# Patient Record
Sex: Female | Born: 1940 | Hispanic: No | Marital: Married | Smoking: Never smoker
Health system: Southern US, Community
[De-identification: ages and names within clinical notes are randomized; demographics above are authoritative.]

## PROBLEM LIST (undated history)

## (undated) DIAGNOSIS — E785 Hyperlipidemia, unspecified: Secondary | ICD-10-CM

## (undated) DIAGNOSIS — N289 Disorder of kidney and ureter, unspecified: Secondary | ICD-10-CM

## (undated) DIAGNOSIS — J309 Allergic rhinitis, unspecified: Secondary | ICD-10-CM

## (undated) DIAGNOSIS — I10 Essential (primary) hypertension: Secondary | ICD-10-CM

## (undated) DIAGNOSIS — Z201 Contact with and (suspected) exposure to tuberculosis: Secondary | ICD-10-CM

## (undated) HISTORY — DX: Essential (primary) hypertension: I10

## (undated) HISTORY — DX: Hyperlipidemia, unspecified: E78.5

## (undated) HISTORY — PX: ABDOMINAL SURGERY: SHX537

## (undated) HISTORY — DX: Disorder of kidney and ureter, unspecified: N28.9

## (undated) HISTORY — DX: Contact with and (suspected) exposure to tuberculosis: Z20.1

## (undated) HISTORY — DX: Allergic rhinitis, unspecified: J30.9

---

## 2006-04-23 ENCOUNTER — Encounter: Payer: Self-pay | Admitting: Emergency Medicine

## 2007-07-01 ENCOUNTER — Encounter: Payer: Self-pay | Admitting: Emergency Medicine

## 2007-07-21 ENCOUNTER — Encounter: Payer: Self-pay | Admitting: Emergency Medicine

## 2008-02-29 ENCOUNTER — Encounter: Payer: Self-pay | Admitting: Emergency Medicine

## 2008-03-03 ENCOUNTER — Encounter: Payer: Self-pay | Admitting: Emergency Medicine

## 2008-08-17 ENCOUNTER — Encounter: Payer: Self-pay | Admitting: Emergency Medicine

## 2008-08-18 ENCOUNTER — Encounter: Payer: Self-pay | Admitting: Emergency Medicine

## 2009-12-28 ENCOUNTER — Ambulatory Visit: Payer: Self-pay | Admitting: Emergency Medicine

## 2009-12-28 DIAGNOSIS — I10 Essential (primary) hypertension: Secondary | ICD-10-CM | POA: Insufficient documentation

## 2009-12-28 DIAGNOSIS — J309 Allergic rhinitis, unspecified: Secondary | ICD-10-CM | POA: Insufficient documentation

## 2009-12-28 DIAGNOSIS — N189 Chronic kidney disease, unspecified: Secondary | ICD-10-CM

## 2009-12-28 DIAGNOSIS — J8410 Pulmonary fibrosis, unspecified: Secondary | ICD-10-CM | POA: Insufficient documentation

## 2010-01-30 ENCOUNTER — Ambulatory Visit: Payer: Self-pay | Admitting: Emergency Medicine

## 2010-01-30 LAB — CONVERTED CEMR LAB: Cholesterol, target level: 200 mg/dL

## 2010-02-02 ENCOUNTER — Encounter: Payer: Self-pay | Admitting: Emergency Medicine

## 2010-02-07 ENCOUNTER — Encounter: Payer: Self-pay | Admitting: Emergency Medicine

## 2010-03-07 ENCOUNTER — Other Ambulatory Visit: Admission: RE | Admit: 2010-03-07 | Discharge: 2010-03-07 | Payer: Self-pay | Admitting: Obstetrics and Gynecology

## 2010-03-16 ENCOUNTER — Encounter: Admission: RE | Admit: 2010-03-16 | Discharge: 2010-03-16 | Payer: Self-pay | Admitting: Obstetrics and Gynecology

## 2010-06-26 NOTE — Assessment & Plan Note (Signed)
Summary: ILD   Visit Type:  Initial Consult Primary Provider/Referring Provider:  Dr. Posey Pronto  CC:  Patient needs to get established with pulmonologist in [place]. Moved from [place] several months afo and lives with her daughter. She says she is having no problems with her breathing today.Marland Kitchen  History of Present Illness: 70 yo woman, never smoker, native of the [place]. 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 Sent home on O2 that was reportedly weaned to off in a few months. No real problems since. 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 No other hospitalizations. She has had PFT and imaging before.   She presents today to establish care. Not having any acute problems. No reported hx RA, SLE, other inflammatory disease. No aspiration hx or current signs, no dysphagia. She had a remote TB exposure as a girl, and thinks she had BCG in the [place]. Has never been on amiodarone, MTX or other meds associated with ILD.   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 Alcohol drinks/day:  0 Smoking  Status:  never  Review of Systems       The patient complains of shortness of breath with activity, non-productive cough, acid heartburn, itching, joint stiffness or pain, and rash.  The patient denies shortness of breath at rest, productive cough, coughing up blood, chest pain, irregular heartbeats, indigestion, loss of appetite, weight change, abdominal pain, difficulty swallowing, sore throat, tooth/dental problems, headaches, nasal congestion/difficulty breathing through nose, sneezing, ear ache, anxiety, depression, hand/feet swelling, change in color of mucus, and fever.    Vital Signs:  Patient profile:   70 year old female Height:      59 inches (149.86 cm) Weight:      110.38 pounds (50.17 kg) BMI:     22.37 O2 Sat:      98 % on Room air Temp:     97.8 degrees F (36.56 degrees C) oral Pulse rate:   52 / minute BP sitting:   118 / 72  (left arm) Cuff size:   regular  Vitals Entered By: Francesca Jewett CMA (December 28, 2009 2:49 PM)  O2 Sat at Rest %:  98 O2 Flow:  Room air CC: Patient needs to get established with pulmonologist in [place]. Moved from [place] several months afo and lives with her daughter. She says she is having no problems with her breathing today. Is Patient Diabetic? No   Physical Exam  General:  Small comfortable woman, normal appearance and healthy appearing.   Head:  normocephalic and atraumatic Eyes:  conjunctiva and sclera clear Nose:  no deformity, discharge, inflammation, or  lesions Mouth:  no deformity or lesions Neck:  no masses, thyromegaly, or abnormal cervical nodes Lungs:  few fine insp crackles at B bases, no wheezing Heart:  regular rate and rhythm, S1, S2 without murmurs, rubs, gallops, or clicks Abdomen:  not examined Msk:  no deformity or scoliosis noted with normal posture Extremities:  no clubbing, cyanosis, edema, or deformity noted Neurologic:  non-focal Skin:  intact without lesions or rashes Psych:  alert and cooperative; normal  mood and affect; normal attention span and concentration   Impression & Recommendations:  Problem # 1:  INTERSTITIAL LUNG DISEASE (ICD-515) Unclear whether this was truly IPF, but given the time course and her lack of symptoms I doubt this. Her pulmonolgist in [place] raised question of COP or some other reversible process, and I think this is much more likely.  - establish current baseline with walking oximetry and PFT's - compare to priors from [facility] when records arrive - imaging when I've reviewed records - CXR vs CT scan  Other Orders: Consultation Level IV [id number])  Patient Instructions: 1)  We will obtain your x-rays, records and pulmonary tests from [place].  2)  We will repeat your Pulmonary Function Testing at your next appointment.  3)  Walking oximetry today showed that  4)  Follow up with Dr Lamonte Sakai in 1 month  Appended Document: ILD Ambulatory Pulse Oximetry  Resting; HR___55__    02 Sat__97% on room air___  Lap1 (185 feet)   HR__67___   02 Sat__97% on room air___ Lap2 (185 feet)   HR__73___   02 Sat__95% on room air___    Lap3 (185 feet)   HR__74___   02 Sat__96% on room air___  _x__Test Completed without Difficulty ___Test Stopped due to:

## 2010-06-26 NOTE — Letter (Signed)
Summary: [facility]  [facility]   Imported By: Bubba Hales 02/16/2010 10:38:44  _____________________________________________________________________  External Attachment:    Type:   Image     Comment:   External Document

## 2010-06-26 NOTE — Letter (Signed)
Summary: Medical Record Release  Medical Record Release   Imported By: Adin Hector 02/07/2010 13:36:16  _____________________________________________________________________  External Attachment:    Type:   Image     Comment:   External Document

## 2010-06-26 NOTE — Letter (Signed)
Summary: record release  record release   Imported By: Mateo Flow 02/02/2010 16:49:18  _____________________________________________________________________  External Attachment:    Type:   Image     Comment:   External Document

## 2010-06-26 NOTE — Letter (Signed)
Summary: [facility]  [facility]   Imported By: Bubba Hales 02/16/2010 10:40:41  _____________________________________________________________________  External Attachment:    Type:   Image     Comment:   External Document

## 2010-06-26 NOTE — Miscellaneous (Signed)
Summary: Orders Update pft charges  Clinical Lists Changes  Orders: Added new Service order of Carbon Monoxide diffusing w/capacity (94720) - Signed Added new Service order of Lung Volumes (94240) - Signed Added new Service order of Spirometry (Pre & Post) (94060) - Signed 

## 2010-06-26 NOTE — Letter (Signed)
Summary: Dione Housekeeper MD/[facility]  Ather Deboraha Sprang MD/[facility]   Imported By: Bubba Hales 02/16/2010 09:53:30  _____________________________________________________________________  External Attachment:    Type:   Image     Comment:   External Document

## 2010-06-26 NOTE — Assessment & Plan Note (Signed)
Summary: ILD   Visit Type:  Follow-up Primary Paula Hartman/Referring Sonji Starkes:  Dr. Posey Pronto  CC:  ILD follow-up with PFT today....  History of Present Illness: 70 yo woman, never smoker, native of the [place]. 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 Sent home on O2 that was reportedly weaned to off in a few months. No real problems since. 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 No other hospitalizations. She has had PFT and imaging before.   She presents today to establish care. Not having any acute problems. No reported hx RA, SLE, other inflammatory disease. No aspiration hx or current signs, no dysphagia. She had a remote TB exposure as a girl, and thinks she had BCG in the [place]. Has never been on amiodarone, MTX or other meds associated with ILD.   ROV 01/30/10 -- returns for f/u today for ILD. No new problems. Has had PFT today as below.   Lipid Management History:      Positive NCEP/ATP III risk factors include female age 48 years old or older and hypertension.  Negative NCEP/ATP III risk factors include non-tobacco-user status.     Current Medications (verified): 1)  Calcium 600 Mg Tabs (Calcium) .... Once Daily 2)  Metoprolol Tartrate 50 Mg Tabs (Metoprolol Tartrate) .Marland Kitchen.. 1 By Mouth Two Times A Day 3)  Nifedipine 90 Mg Xr24h-Tab (Nifedipine) .Marland Kitchen.. 1 By Mouth At Bedtime 4)  Tramadol Hcl 50 Mg Tabs (Tramadol Hcl) .... As Needed 5)  Lipitor 20 Mg Tabs (Atorvastatin Calcium) .... Once Daily 6)  Clonidine Hcl 0.1 Mg Tabs (Clonidine Hcl) .Marland Kitchen.. 1 By Mouth Two Times A Day  Allergies (verified): No Known Drug Allergies  Vital Signs:  Patient profile:   70 year old female Height:      59 inches (149.86 cm) Weight:      113 pounds (51.36 kg) BMI:     22.91 O2 Sat:      98 % on Room air Temp:     98.3 degrees F (36.83 degrees C) oral Pulse rate:   55 / minute BP  sitting:   140 / 76  (left arm) Cuff size:   regular  Vitals Entered By: Francesca Jewett CMA (January 30, 2010 12:06 PM)  O2 Sat at Rest %:  98 O2 Flow:  Room air  Physical Exam  General:  Small comfortable woman, normal appearance and healthy appearing.   Head:  normocephalic and atraumatic Eyes:  conjunctiva and sclera clear Nose:  no deformity, discharge, inflammation, or lesions Mouth:  no deformity or lesions Neck:  no masses, thyromegaly, or abnormal cervical nodes Lungs:  few fine insp crackles at B bases, no wheezing Heart:  regular rate and rhythm, S1, S2 without murmurs, rubs, gallops, or clicks Abdomen:  not examined Msk:  no deformity or scoliosis noted with normal posture Extremities:  no clubbing, cyanosis, edema, or deformity noted Neurologic:  non-focal Skin:  intact without lesions or rashes Psych:  alert and cooperative; normal mood and affect; normal attention span and concentration   Pulmonary Function Test Date: 01/30/2010 Height (in.): 59 Gender: Female  Pre-Spirometry FVC    Value: 1.77 L/min   Pred: 2.25 L/min     % Pred: 78 % FEV1    Value: 1.50 L     Pred: 1.57 L     % Pred: 96 % FEV1/FVC  Value: 85 %     Pred: 72 %    FEF 25-75  Value: 2.12 L/min   Pred: 1.99 L/min     % Pred: 106 %  Post-Spirometry FVC    Value: 1.83 L/min   Pred: 2.25 L/min     % Pred: 81 % FEV1    Value: 1.56 L     Pred: 1.57 L     % Pred: 100 % FEV1/FVC  Value: 86 %     Pred: 72 %    FEF 25-75  Value: 2.23 L/min   Pred: 1.99 L/min     % Pred: 112 %  Lung Volumes TLC    Value: 2.77 L   % Pred: 73 % RV    Value: 1.00 L   % Pred: 65 % DLCO    Value: 10.1 %   % Pred: 68 % DLCO/VA  Value: 4.27 %   % Pred: 123 %  Impression & Recommendations:  Problem # 1:  INTERSTITIAL LUNG DISEASE (ICD-515) PFT today with mild restriction. No clinical limitations currently - CXR today - compare PFT and films to those from [facility] when available - rov in 1 yr or as needed   Medications Added  to Medication List This Visit: 1)  Calcium 600 Mg Tabs (Calcium) .... Once daily 2)  Metoprolol Tartrate 50 Mg Tabs (Metoprolol tartrate) .Marland Kitchen.. 1 by mouth two times a day 3)  Nifedipine 90 Mg Xr24h-tab (Nifedipine) .Marland Kitchen.. 1 by mouth at bedtime 4)  Tramadol Hcl 50 Mg Tabs (Tramadol hcl) .... As needed 5)  Lipitor 20 Mg Tabs (Atorvastatin calcium) .... Once daily 6)  Clonidine Hcl 0.1 Mg Tabs (Clonidine hcl) .Marland Kitchen.. 1 by mouth two times a day  Other Orders: Est. Patient Level IV [id number]) T-2 View CXR ([id number])  Lipid Assessment/Plan:      Based on NCEP/ATP III, the patient's risk factor category is "2 or more risk factors and a calculated 10 year CAD risk of > 20%".  The patient's lipid goals are as follows: Total cholesterol goal is 200; LDL cholesterol goal is 130; HDL cholesterol goal is 40; Triglyceride goal is 150.    Patient Instructions: 1)  CXR today 2)  Follow up with Dr Lamonte Sakai in 1 year or sooner if you have any new problems.

## 2011-06-17 DIAGNOSIS — J069 Acute upper respiratory infection, unspecified: Secondary | ICD-10-CM | POA: Diagnosis not present

## 2011-06-18 DIAGNOSIS — N182 Chronic kidney disease, stage 2 (mild): Secondary | ICD-10-CM | POA: Diagnosis not present

## 2011-06-18 DIAGNOSIS — M810 Age-related osteoporosis without current pathological fracture: Secondary | ICD-10-CM | POA: Diagnosis not present

## 2011-06-18 DIAGNOSIS — Z79899 Other long term (current) drug therapy: Secondary | ICD-10-CM | POA: Diagnosis not present

## 2011-06-18 DIAGNOSIS — I129 Hypertensive chronic kidney disease with stage 1 through stage 4 chronic kidney disease, or unspecified chronic kidney disease: Secondary | ICD-10-CM | POA: Diagnosis not present

## 2011-06-18 DIAGNOSIS — J00 Acute nasopharyngitis [common cold]: Secondary | ICD-10-CM | POA: Diagnosis not present

## 2011-09-13 DIAGNOSIS — I129 Hypertensive chronic kidney disease with stage 1 through stage 4 chronic kidney disease, or unspecified chronic kidney disease: Secondary | ICD-10-CM | POA: Diagnosis not present

## 2011-09-13 DIAGNOSIS — Z79899 Other long term (current) drug therapy: Secondary | ICD-10-CM | POA: Diagnosis not present

## 2011-09-13 DIAGNOSIS — D510 Vitamin B12 deficiency anemia due to intrinsic factor deficiency: Secondary | ICD-10-CM | POA: Diagnosis not present

## 2011-09-13 DIAGNOSIS — M810 Age-related osteoporosis without current pathological fracture: Secondary | ICD-10-CM | POA: Diagnosis not present

## 2011-09-13 DIAGNOSIS — R030 Elevated blood-pressure reading, without diagnosis of hypertension: Secondary | ICD-10-CM | POA: Diagnosis not present

## 2011-09-18 DIAGNOSIS — R030 Elevated blood-pressure reading, without diagnosis of hypertension: Secondary | ICD-10-CM | POA: Diagnosis not present

## 2011-09-18 DIAGNOSIS — I129 Hypertensive chronic kidney disease with stage 1 through stage 4 chronic kidney disease, or unspecified chronic kidney disease: Secondary | ICD-10-CM | POA: Diagnosis not present

## 2011-09-18 DIAGNOSIS — Z79899 Other long term (current) drug therapy: Secondary | ICD-10-CM | POA: Diagnosis not present

## 2011-09-18 DIAGNOSIS — N182 Chronic kidney disease, stage 2 (mild): Secondary | ICD-10-CM | POA: Diagnosis not present

## 2011-09-18 DIAGNOSIS — J00 Acute nasopharyngitis [common cold]: Secondary | ICD-10-CM | POA: Diagnosis not present

## 2011-09-18 DIAGNOSIS — D510 Vitamin B12 deficiency anemia due to intrinsic factor deficiency: Secondary | ICD-10-CM | POA: Diagnosis not present

## 2011-09-18 DIAGNOSIS — M810 Age-related osteoporosis without current pathological fracture: Secondary | ICD-10-CM | POA: Diagnosis not present

## 2011-10-01 DIAGNOSIS — D500 Iron deficiency anemia secondary to blood loss (chronic): Secondary | ICD-10-CM | POA: Diagnosis not present

## 2011-10-01 DIAGNOSIS — M810 Age-related osteoporosis without current pathological fracture: Secondary | ICD-10-CM | POA: Diagnosis not present

## 2011-10-01 DIAGNOSIS — I129 Hypertensive chronic kidney disease with stage 1 through stage 4 chronic kidney disease, or unspecified chronic kidney disease: Secondary | ICD-10-CM | POA: Diagnosis not present

## 2011-12-24 DIAGNOSIS — E785 Hyperlipidemia, unspecified: Secondary | ICD-10-CM | POA: Diagnosis not present

## 2011-12-24 DIAGNOSIS — N189 Chronic kidney disease, unspecified: Secondary | ICD-10-CM | POA: Diagnosis not present

## 2011-12-24 DIAGNOSIS — I10 Essential (primary) hypertension: Secondary | ICD-10-CM | POA: Diagnosis not present

## 2011-12-24 DIAGNOSIS — Z1211 Encounter for screening for malignant neoplasm of colon: Secondary | ICD-10-CM | POA: Diagnosis not present

## 2011-12-24 DIAGNOSIS — M1710 Unilateral primary osteoarthritis, unspecified knee: Secondary | ICD-10-CM | POA: Diagnosis not present

## 2011-12-24 DIAGNOSIS — Z2911 Encounter for prophylactic immunotherapy for respiratory syncytial virus (RSV): Secondary | ICD-10-CM | POA: Diagnosis not present

## 2011-12-24 DIAGNOSIS — D649 Anemia, unspecified: Secondary | ICD-10-CM | POA: Diagnosis not present

## 2011-12-27 DIAGNOSIS — H259 Unspecified age-related cataract: Secondary | ICD-10-CM | POA: Diagnosis not present

## 2011-12-30 DIAGNOSIS — M25569 Pain in unspecified knee: Secondary | ICD-10-CM | POA: Diagnosis not present

## 2012-01-06 DIAGNOSIS — E875 Hyperkalemia: Secondary | ICD-10-CM | POA: Diagnosis not present

## 2012-01-24 ENCOUNTER — Encounter: Payer: Self-pay | Admitting: Pulmonary Disease

## 2012-01-24 DIAGNOSIS — J8409 Other alveolar and parieto-alveolar conditions: Secondary | ICD-10-CM | POA: Diagnosis not present

## 2012-01-24 DIAGNOSIS — I129 Hypertensive chronic kidney disease with stage 1 through stage 4 chronic kidney disease, or unspecified chronic kidney disease: Secondary | ICD-10-CM | POA: Diagnosis not present

## 2012-01-24 DIAGNOSIS — I10 Essential (primary) hypertension: Secondary | ICD-10-CM | POA: Diagnosis not present

## 2012-01-24 DIAGNOSIS — D649 Anemia, unspecified: Secondary | ICD-10-CM | POA: Diagnosis not present

## 2012-01-24 DIAGNOSIS — N183 Chronic kidney disease, stage 3 unspecified: Secondary | ICD-10-CM | POA: Diagnosis not present

## 2012-01-28 ENCOUNTER — Encounter: Payer: Self-pay | Admitting: Emergency Medicine

## 2012-01-28 ENCOUNTER — Ambulatory Visit (INDEPENDENT_AMBULATORY_CARE_PROVIDER_SITE_OTHER)
Admission: RE | Admit: 2012-01-28 | Discharge: 2012-01-28 | Disposition: A | Discharge status: Home / Self Care | Payer: Medicare Other | Source: Ambulatory Visit | Attending: Emergency Medicine | Admitting: Emergency Medicine

## 2012-01-28 ENCOUNTER — Ambulatory Visit (INDEPENDENT_AMBULATORY_CARE_PROVIDER_SITE_OTHER): Payer: Medicare Other | Admitting: Emergency Medicine

## 2012-01-28 VITALS — BP 120/74 | HR 60 | Temp 98.2°F | Ht <= 58 in | Wt 111.8 lb

## 2012-01-28 DIAGNOSIS — J984 Other disorders of lung: Secondary | ICD-10-CM | POA: Diagnosis not present

## 2012-01-28 DIAGNOSIS — J8410 Pulmonary fibrosis, unspecified: Secondary | ICD-10-CM

## 2012-01-28 NOTE — Progress Notes (Signed)
Subjective:    Patient ID: Paula Hartman, female    DOB: Nov 11, 1940, 71 y.o.   MRN: [id number]  HPI 71 yo woman, never smoker, native of the [place]. 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 Sent home on O2 that was reportedly weaned to off in a few months. No real problems since. 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 No other hospitalizations. She has had PFT and imaging before.   She presents today to establish care. Not having any acute problems. No reported hx RA, SLE, other inflammatory disease. No aspiration hx or current signs, no dysphagia. She had a remote TB exposure as a girl, and thinks she had BCG in the [place]. Has never been on amiodarone, MTX or other meds associated with ILD.   ROV 72/6/11 -- returns for f/u today for ILD. No new problems. Has had PFT today as below.  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 She reports that she remains active, is able to do her usual activities. She will get fatigued if she climbs a lot of stairs. Clinically stable. No hospitalizations. No apparent flares.    Pulmonary Function Test  Date: 01/30/2010  Height (in.): 70  Gender: Female  Pre-Spirometry  FVC Value: 1.77 L/min Pred: 2.25 L/min % Pred: 78 %  FEV1 Value: 1.50 L Pred: 1.57 L % Pred: 96 %  FEV1/FVC Value: 85 % Pred: 72 % FEF 25-75 Value: 2.12 L/min Pred: 1.99 L/min % Pred: 106 %   Post-Spirometry  FVC Value: 1.83 L/min Pred: 2.25 L/min % Pred: 81 %  FEV1 Value: 1.56 L Pred: 1.57 L % Pred: 100 %  FEV1/FVC Value: 86 % Pred: 72 % FEF 25-75 Value: 2.23 L/min Pred: 1.99 L/min % Pred: 112 %   Lung Volumes  TLC Value: 2.77 L % Pred: 73 %  RV Value: 1.00 L % Pred: 65 %  DLCO Value: 10.1 % % Pred: 68 %  DLCO/VA Value: 4.27 % % Pred: 123 %     Review of Systems  Constitutional: Negative for fever, chills,  diaphoresis, activity change, appetite change, fatigue and unexpected weight change.  HENT: Positive for sore throat and facial swelling. Negative for nosebleeds, congestion, rhinorrhea, sneezing, mouth sores, trouble swallowing, dental problem, voice change, postnasal drip, sinus pressure and tinnitus.   Eyes: Negative for visual disturbance.  Respiratory: Positive for shortness of breath. Negative for apnea, cough, choking, chest tightness, wheezing and stridor.   Cardiovascular: Negative for chest pain, palpitations and leg swelling.  Gastrointestinal: Negative for nausea, vomiting, abdominal pain, constipation, blood in stool and abdominal distention.  Genitourinary: Negative for difficulty urinating.  Musculoskeletal: Negative for myalgias, back pain, joint swelling, arthralgias and gait problem.  Skin: Negative for rash.  Neurological: Positive for headaches. Negative for dizziness, tremors, seizures, syncope, speech difficulty, weakness, light-headedness and numbness.  Hematological: Does not bruise/bleed easily.  Psychiatric/Behavioral: Negative for confusion, disturbed wake/sleep cycle and agitation. The patient is not nervous/anxious.        Objective:   Physical Exam Filed Vitals:   01/28/12 0954  BP: 120/74  Pulse: 60  Temp: 98.2 F (36.8 C)   Gen: Pleasant, well-appearing elderly woman, in no distress,  normal affect  ENT: No lesions,  mouth clear,  oropharynx clear, no postnasal drip  Neck: No JVD, no TMG, no  carotid bruits  Lungs: No use of accessory muscles, soft, fine insp crackles L base  Cardiovascular: RRR, heart sounds normal, no murmur or gallops, no peripheral edema  Musculoskeletal: No deformities, no cyanosis or clubbing  Neuro: alert, non focal  Skin: Warm, no lesions or rashes       Assessment & Plan:

## 2012-01-28 NOTE — Assessment & Plan Note (Signed)
Clinically stable without limitations. Etiology is unclear but no hx to suggest progression - CXR today - rov 1 year and do PFT at that time for comparison - rov sooner if she clinically changes

## 2012-01-28 NOTE — Patient Instructions (Addendum)
We will perform a CXR today Follow up with Dr Lamonte Sakai in 1 year or sooner if your breathing changes in any way We will perform breathing testing in a year to compare with your priors.

## 2012-02-18 DIAGNOSIS — M21869 Other specified acquired deformities of unspecified lower leg: Secondary | ICD-10-CM | POA: Diagnosis not present

## 2012-02-18 DIAGNOSIS — M25569 Pain in unspecified knee: Secondary | ICD-10-CM | POA: Diagnosis not present

## 2012-02-20 DIAGNOSIS — L989 Disorder of the skin and subcutaneous tissue, unspecified: Secondary | ICD-10-CM | POA: Diagnosis not present

## 2012-02-20 DIAGNOSIS — J40 Bronchitis, not specified as acute or chronic: Secondary | ICD-10-CM | POA: Diagnosis not present

## 2012-02-20 DIAGNOSIS — Z23 Encounter for immunization: Secondary | ICD-10-CM | POA: Diagnosis not present

## 2012-03-26 DIAGNOSIS — L821 Other seborrheic keratosis: Secondary | ICD-10-CM | POA: Diagnosis not present

## 2012-03-26 DIAGNOSIS — L280 Lichen simplex chronicus: Secondary | ICD-10-CM | POA: Diagnosis not present

## 2012-04-16 DIAGNOSIS — E785 Hyperlipidemia, unspecified: Secondary | ICD-10-CM | POA: Diagnosis not present

## 2012-04-16 DIAGNOSIS — D649 Anemia, unspecified: Secondary | ICD-10-CM | POA: Diagnosis not present

## 2012-04-16 DIAGNOSIS — I10 Essential (primary) hypertension: Secondary | ICD-10-CM | POA: Diagnosis not present

## 2012-04-16 DIAGNOSIS — N189 Chronic kidney disease, unspecified: Secondary | ICD-10-CM | POA: Diagnosis not present

## 2012-05-11 DIAGNOSIS — L508 Other urticaria: Secondary | ICD-10-CM | POA: Diagnosis not present

## 2012-05-18 DIAGNOSIS — R21 Rash and other nonspecific skin eruption: Secondary | ICD-10-CM | POA: Diagnosis not present

## 2012-05-18 DIAGNOSIS — J309 Allergic rhinitis, unspecified: Secondary | ICD-10-CM | POA: Diagnosis not present

## 2012-10-06 DIAGNOSIS — J069 Acute upper respiratory infection, unspecified: Secondary | ICD-10-CM | POA: Diagnosis not present

## 2012-10-06 DIAGNOSIS — E785 Hyperlipidemia, unspecified: Secondary | ICD-10-CM | POA: Diagnosis not present

## 2012-10-06 DIAGNOSIS — I10 Essential (primary) hypertension: Secondary | ICD-10-CM | POA: Diagnosis not present

## 2012-10-06 DIAGNOSIS — D649 Anemia, unspecified: Secondary | ICD-10-CM | POA: Diagnosis not present

## 2012-10-06 DIAGNOSIS — N189 Chronic kidney disease, unspecified: Secondary | ICD-10-CM | POA: Diagnosis not present

## 2012-10-06 DIAGNOSIS — M1990 Unspecified osteoarthritis, unspecified site: Secondary | ICD-10-CM | POA: Diagnosis not present

## 2012-11-18 DIAGNOSIS — N189 Chronic kidney disease, unspecified: Secondary | ICD-10-CM | POA: Diagnosis not present

## 2012-11-18 DIAGNOSIS — E785 Hyperlipidemia, unspecified: Secondary | ICD-10-CM | POA: Diagnosis not present

## 2012-12-14 DIAGNOSIS — R21 Rash and other nonspecific skin eruption: Secondary | ICD-10-CM | POA: Diagnosis not present

## 2012-12-14 DIAGNOSIS — J309 Allergic rhinitis, unspecified: Secondary | ICD-10-CM | POA: Diagnosis not present

## 2013-01-13 DIAGNOSIS — I129 Hypertensive chronic kidney disease with stage 1 through stage 4 chronic kidney disease, or unspecified chronic kidney disease: Secondary | ICD-10-CM | POA: Diagnosis not present

## 2013-01-13 DIAGNOSIS — N183 Chronic kidney disease, stage 3 unspecified: Secondary | ICD-10-CM | POA: Diagnosis not present

## 2013-01-13 DIAGNOSIS — D649 Anemia, unspecified: Secondary | ICD-10-CM | POA: Diagnosis not present

## 2013-02-10 ENCOUNTER — Encounter: Payer: Self-pay | Admitting: Emergency Medicine

## 2013-02-10 ENCOUNTER — Ambulatory Visit (INDEPENDENT_AMBULATORY_CARE_PROVIDER_SITE_OTHER): Payer: Medicare Other | Admitting: Emergency Medicine

## 2013-02-10 VITALS — BP 122/72 | HR 62 | Temp 97.9°F | Ht <= 58 in | Wt 110.0 lb

## 2013-02-10 DIAGNOSIS — J8410 Pulmonary fibrosis, unspecified: Secondary | ICD-10-CM | POA: Diagnosis not present

## 2013-02-10 NOTE — Progress Notes (Signed)
Subjective:    Patient ID: Paula Hartman, female    DOB: [birth year], 72 y.o.   MRN: [id number]  HPI 72 yo woman, never smoker, native of the [place]. 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 Sent home on O2 that was reportedly weaned to off in a few months. No real problems since. 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 No other hospitalizations. She has had PFT and imaging before.   She presents today to establish care. Not having any acute problems. No reported hx RA, SLE, other inflammatory disease. No aspiration hx or current signs, no dysphagia. She had a remote TB exposure as a girl, and thinks she had BCG in the [place]. Has never been on amiodarone, MTX or other meds associated with ILD.   ROV 01/30/10 -- returns for f/u today for ILD. No new problems. Has had PFT today as below.  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 She reports that she remains active, is able to do her usual activities. She will get fatigued if she climbs a lot of stairs. Clinically stable. No hospitalizations. No apparent flares.   ROV 02/10/13 -- follow up for idiopathic ILD. She is doing well, has no dyspnea, no cough. Functional capacity is good. No reported flares.   PULMONARY FUNCTON TEST 01/30/2010 02/10/2013  FVC 1.77 1.72  FEV1 1.5 1.56  FEV1/FVC 84.7 90.7  FVC  % Predicted 78 88  FEV % Predicted 96 108  FeF 25-75 2.12 1.72  FeF 25-75 % Predicted 1.99 1.84    Pulmonary Function Test  Date: 01/30/2010  Height (in.): 59  Gender: Female  Pre-Spirometry  FVC Value: 1.77 L/min Pred: 2.25 L/min % Pred: 78 %  FEV1 Value: 1.50 L Pred: 1.57 L % Pred: 96 %  FEV1/FVC Value: 85 % Pred: 72 % FEF 25-75 Value: 2.12 L/min Pred: 1.99 L/min % Pred: 106 %   Post-Spirometry  FVC Value: 1.83 L/min Pred: 2.25 L/min % Pred: 81 %  FEV1 Value: 1.56 L  Pred: 1.57 L % Pred: 100 %  FEV1/FVC Value: 86 % Pred: 72 % FEF 25-75 Value: 2.23 L/min Pred: 1.99 L/min % Pred: 112 %   Lung Volumes  TLC Value: 2.77 L % Pred: 73 %  RV Value: 1.00 L % Pred: 65 %  DLCO Value: 10.1 % % Pred: 68 %  DLCO/VA Value: 4.27 % % Pred: 123 %     Review of Systems  Constitutional: Negative for fever, chills, diaphoresis, activity change, appetite change, fatigue and unexpected weight change.  HENT: Positive for sore throat and facial swelling. Negative for nosebleeds, congestion, rhinorrhea, sneezing, mouth sores, trouble swallowing, dental problem, voice change, postnasal drip, sinus pressure and tinnitus.   Eyes: Negative for visual disturbance.  Respiratory: Positive for shortness of breath. Negative for apnea, cough, choking, chest tightness, wheezing and stridor.   Cardiovascular: Negative for chest pain, palpitations and leg swelling.  Gastrointestinal: Negative for nausea, vomiting, abdominal pain, constipation, blood in stool and abdominal distention.  Genitourinary: Negative for difficulty urinating.  Musculoskeletal: Negative for myalgias, back pain, joint swelling, arthralgias and gait problem.  Skin: Negative for rash.  Neurological: Positive for headaches. Negative for dizziness, tremors, seizures, syncope, speech difficulty, weakness, light-headedness and numbness.  Hematological: Does not bruise/bleed easily.  Psychiatric/Behavioral: Negative for confusion, sleep disturbance and  agitation. The patient is not nervous/anxious.        Objective:   Physical Exam Filed Vitals:   02/10/13 1546  BP: 122/72  Pulse: 62  Temp: 97.9 F (36.6 C)   Gen: Pleasant, well-appearing elderly woman, in no distress,  normal affect  ENT: No lesions,  mouth clear,  oropharynx clear, no postnasal drip  Neck: No JVD, no TMG, no carotid bruits  Lungs: No use of accessory muscles, soft, fine insp crackles L base  Cardiovascular: RRR, heart sounds normal, no  murmur or gallops, no peripheral edema  Musculoskeletal: No deformities, no cyanosis or clubbing  Neuro: alert, non focal  Skin: Warm, no lesions or rashes      Assessment & Plan:  INTERSTITIAL LUNG DISEASE Has been stable clinically and by PFT. Discussed w her the possibility of pirfenidone today. She is interested, especially if she starts to develop sx.  - CT chest high res - will revisit the pirfenidone next time.  - rov in Summer 2015

## 2013-02-10 NOTE — Assessment & Plan Note (Signed)
Has been stable clinically and by PFT. Discussed w her the possibility of pirfenidone today. She is interested, especially if she starts to develop sx.  - CT chest high res - will revisit the pirfenidone next time.  - rov in Summer 2015

## 2013-02-10 NOTE — Patient Instructions (Addendum)
We will perform a CT scan of your chest to compare with your priors.  Your pulmonary function testing is stable compared with 2011 We discussed a medication today called pirfenidone. This medication may slow down the progression of lung scarring. We can revisit it when you return from the [place] next Summer  Follow with Dr Lamonte Sakai in June 2015

## 2013-02-10 NOTE — Progress Notes (Signed)
PFT done today. 

## 2013-02-16 ENCOUNTER — Ambulatory Visit (INDEPENDENT_AMBULATORY_CARE_PROVIDER_SITE_OTHER)
Admission: RE | Admit: 2013-02-16 | Discharge: 2013-02-16 | Disposition: A | Discharge status: Home / Self Care | Payer: Medicare Other | Source: Ambulatory Visit | Attending: Emergency Medicine | Admitting: Emergency Medicine

## 2013-02-16 DIAGNOSIS — J8410 Pulmonary fibrosis, unspecified: Secondary | ICD-10-CM | POA: Diagnosis not present

## 2013-02-16 DIAGNOSIS — R911 Solitary pulmonary nodule: Secondary | ICD-10-CM | POA: Diagnosis not present

## 2013-02-26 DIAGNOSIS — Z23 Encounter for immunization: Secondary | ICD-10-CM | POA: Diagnosis not present

## 2013-03-15 DIAGNOSIS — H18419 Arcus senilis, unspecified eye: Secondary | ICD-10-CM | POA: Diagnosis not present

## 2013-03-15 DIAGNOSIS — H02839 Dermatochalasis of unspecified eye, unspecified eyelid: Secondary | ICD-10-CM | POA: Diagnosis not present

## 2013-03-15 DIAGNOSIS — H2510 Age-related nuclear cataract, unspecified eye: Secondary | ICD-10-CM | POA: Diagnosis not present

## 2013-03-15 DIAGNOSIS — H25019 Cortical age-related cataract, unspecified eye: Secondary | ICD-10-CM | POA: Diagnosis not present

## 2013-03-18 ENCOUNTER — Encounter: Payer: Self-pay | Admitting: Emergency Medicine

## 2013-03-29 DIAGNOSIS — H04129 Dry eye syndrome of unspecified lacrimal gland: Secondary | ICD-10-CM | POA: Diagnosis not present

## 2013-03-29 DIAGNOSIS — H169 Unspecified keratitis: Secondary | ICD-10-CM | POA: Diagnosis not present

## 2013-04-01 ENCOUNTER — Other Ambulatory Visit: Payer: Self-pay

## 2013-04-12 DIAGNOSIS — Z0000 Encounter for general adult medical examination without abnormal findings: Secondary | ICD-10-CM | POA: Diagnosis not present

## 2013-04-12 DIAGNOSIS — D631 Anemia in chronic kidney disease: Secondary | ICD-10-CM | POA: Diagnosis not present

## 2013-04-12 DIAGNOSIS — I10 Essential (primary) hypertension: Secondary | ICD-10-CM | POA: Diagnosis not present

## 2013-04-12 DIAGNOSIS — M1710 Unilateral primary osteoarthritis, unspecified knee: Secondary | ICD-10-CM | POA: Diagnosis not present

## 2013-04-12 DIAGNOSIS — E785 Hyperlipidemia, unspecified: Secondary | ICD-10-CM | POA: Diagnosis not present

## 2013-06-11 ENCOUNTER — Encounter: Payer: Self-pay | Admitting: *Deleted

## 2013-07-02 ENCOUNTER — Telehealth: Payer: Self-pay | Admitting: Emergency Medicine

## 2013-07-02 NOTE — Telephone Encounter (Signed)
Had CT done in 04/2013. We tried several times to contact about RB wanting her to have OV. ROV has been scheduled for 07/27/13 at 3:00pm. Pt is out of town until 07/18/13.

## 2013-07-08 NOTE — Progress Notes (Signed)
Quick Note:  Pt has an upcoming appt with RB on 3.3.15 ______

## 2013-07-21 DIAGNOSIS — N183 Chronic kidney disease, stage 3 unspecified: Secondary | ICD-10-CM | POA: Diagnosis not present

## 2013-07-21 DIAGNOSIS — R809 Proteinuria, unspecified: Secondary | ICD-10-CM | POA: Diagnosis not present

## 2013-07-21 DIAGNOSIS — I129 Hypertensive chronic kidney disease with stage 1 through stage 4 chronic kidney disease, or unspecified chronic kidney disease: Secondary | ICD-10-CM | POA: Diagnosis not present

## 2013-07-21 DIAGNOSIS — D649 Anemia, unspecified: Secondary | ICD-10-CM | POA: Diagnosis not present

## 2013-07-27 ENCOUNTER — Encounter: Payer: Self-pay | Admitting: Emergency Medicine

## 2013-07-27 ENCOUNTER — Ambulatory Visit (INDEPENDENT_AMBULATORY_CARE_PROVIDER_SITE_OTHER): Payer: Medicare Other | Admitting: Emergency Medicine

## 2013-07-27 VITALS — BP 128/84 | HR 58 | Ht <= 58 in | Wt 112.0 lb

## 2013-07-27 DIAGNOSIS — J8410 Pulmonary fibrosis, unspecified: Secondary | ICD-10-CM | POA: Diagnosis not present

## 2013-07-27 NOTE — Assessment & Plan Note (Signed)
Repeat CTscan in September to look for stability, follow 14mm nodule.

## 2013-07-27 NOTE — Addendum Note (Signed)
Addended by: Carlos American A on: 07/27/2013 05:10 PM   Modules accepted: Orders

## 2013-07-27 NOTE — Progress Notes (Signed)
Subjective:    Patient ID: Paula Hartman, female    DOB: 1940-09-18, 73 y.o.   MRN: [id number]  HPI 73 yo woman, never smoker, native of the [place]. 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 Sent home on O2 that was reportedly weaned to off in a few months. No real problems since. 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 No other hospitalizations. She has had PFT and imaging before.   She presents today to establish care. Not having any acute problems. No reported hx RA, SLE, other inflammatory disease. No aspiration hx or current signs, no dysphagia. She had a remote TB exposure as a girl, and thinks she had BCG in the [place]. Has never been on amiodarone, MTX or other meds associated with ILD.   ROV 01/30/10 -- returns for f/u today for ILD. No new problems. Has had PFT today as below.  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 She reports that she remains active, is able to do her usual activities. She will get fatigued if she climbs a lot of stairs. Clinically stable. No hospitalizations. No apparent flares.   ROV 02/10/13 -- follow up for idiopathic ILD. She is doing well, has no dyspnea, no cough. Functional capacity is good. No reported flares.  ROV 07/27/13 -- follows up today to review repeat CT scan performed 01/2013 to follow her idiopathic ILD. 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 No LAD. No new issues, no new cough, dyspnea. She does complain of itching on her hands and arms.    PULMONARY FUNCTON TEST 01/30/2010 02/10/2013  FVC 1.77 1.72  FEV1 1.5 1.56  FEV1/FVC 84.7 90.7  FVC  % Predicted 78 88  FEV % Predicted 96 108  FeF 25-75 2.12 1.72  FeF 25-75 % Predicted 1.99 1.84    Pulmonary Function Test  Date: 01/30/2010  Height (in.): 59  Gender: Female  Pre-Spirometry  FVC Value: 1.77  L/min Pred: 2.25 L/min % Pred: 78 %  FEV1 Value: 1.50 L Pred: 1.57 L % Pred: 96 %  FEV1/FVC Value: 85 % Pred: 72 % FEF 25-75 Value: 2.12 L/min Pred: 1.99 L/min % Pred: 106 %   Post-Spirometry  FVC Value: 1.83 L/min Pred: 2.25 L/min % Pred: 81 %  FEV1 Value: 1.56 L Pred: 1.57 L % Pred: 100 %  FEV1/FVC Value: 86 % Pred: 72 % FEF 25-75 Value: 2.23 L/min Pred: 1.99 L/min % Pred: 112 %   Lung Volumes  TLC Value: 2.77 L % Pred: 73 %  RV Value: 1.00 L % Pred: 65 %  DLCO Value: 10.1 % % Pred: 68 %  DLCO/VA Value: 4.27 % % Pred: 123 %   Review of Systems  Constitutional: Negative for fever, chills, diaphoresis, activity change, appetite change, fatigue and unexpected weight change.  HENT: Positive for facial swelling and sore throat. Negative for congestion, dental problem, mouth sores, nosebleeds, postnasal drip, rhinorrhea, sinus pressure, sneezing, tinnitus, trouble swallowing and voice change.   Eyes: Negative for visual disturbance.  Respiratory: Positive for shortness of breath. Negative for apnea, cough, choking, chest tightness, wheezing and stridor.   Cardiovascular: Negative for chest pain, palpitations and leg swelling.  Gastrointestinal: Negative for nausea, vomiting, abdominal pain, constipation, blood in stool and abdominal distention.  Genitourinary: Negative for difficulty urinating.  Musculoskeletal: Negative for  arthralgias, back pain, gait problem, joint swelling and myalgias.  Skin: Negative for rash.  Neurological: Positive for headaches. Negative for dizziness, tremors, seizures, syncope, speech difficulty, weakness, light-headedness and numbness.  Hematological: Does not bruise/bleed easily.  Psychiatric/Behavioral: Negative for confusion, sleep disturbance and agitation. The patient is not nervous/anxious.        Objective:   Physical Exam Filed Vitals:   07/27/13 1510  BP: 128/84  Pulse: 58   Gen: Pleasant, well-appearing elderly woman, in no distress,  normal  affect  ENT: No lesions,  mouth clear,  oropharynx clear, no postnasal drip  Neck: No JVD, no TMG, no carotid bruits  Lungs: No use of accessory muscles, soft, fine insp crackles L base  Cardiovascular: RRR, heart sounds normal, no murmur or gallops, no peripheral edema  Musculoskeletal: No deformities, no cyanosis or clubbing  Neuro: alert, non focal  Skin: Warm, no lesions or rashes      Assessment & Plan:  INTERSTITIAL LUNG DISEASE Repeat CTscan in September to look for stability, follow 34mm nodule.

## 2013-07-27 NOTE — Patient Instructions (Signed)
Your CT scan is stable compared with your priors.  We will repeat your Ct scan in September 2015 Follow with Dr Lamonte Sakai in September to review the scan  You should take Tylenol Cold & Flu with you to the [place] with you in case you get an upper respiratory infection.

## 2013-08-03 DIAGNOSIS — J309 Allergic rhinitis, unspecified: Secondary | ICD-10-CM | POA: Diagnosis not present

## 2013-08-03 DIAGNOSIS — L280 Lichen simplex chronicus: Secondary | ICD-10-CM | POA: Diagnosis not present

## 2013-08-03 DIAGNOSIS — L259 Unspecified contact dermatitis, unspecified cause: Secondary | ICD-10-CM | POA: Diagnosis not present

## 2013-08-03 DIAGNOSIS — R21 Rash and other nonspecific skin eruption: Secondary | ICD-10-CM | POA: Diagnosis not present

## 2013-09-13 DIAGNOSIS — L299 Pruritus, unspecified: Secondary | ICD-10-CM | POA: Diagnosis not present

## 2013-09-13 DIAGNOSIS — L259 Unspecified contact dermatitis, unspecified cause: Secondary | ICD-10-CM | POA: Diagnosis not present

## 2013-09-13 DIAGNOSIS — L280 Lichen simplex chronicus: Secondary | ICD-10-CM | POA: Diagnosis not present

## 2013-11-05 DIAGNOSIS — I10 Essential (primary) hypertension: Secondary | ICD-10-CM | POA: Diagnosis not present

## 2013-11-05 DIAGNOSIS — N189 Chronic kidney disease, unspecified: Secondary | ICD-10-CM | POA: Diagnosis not present

## 2013-11-05 DIAGNOSIS — Z23 Encounter for immunization: Secondary | ICD-10-CM | POA: Diagnosis not present

## 2013-11-05 DIAGNOSIS — R609 Edema, unspecified: Secondary | ICD-10-CM | POA: Diagnosis not present

## 2014-02-17 ENCOUNTER — Other Ambulatory Visit: Payer: Medicare Other

## 2014-08-26 ENCOUNTER — Telehealth: Payer: Self-pay

## 2014-08-26 NOTE — Telephone Encounter (Signed)
08/26/14 Disc Received from [facility] and filed on shelf.Britt Bottom

## 2014-11-16 DIAGNOSIS — N183 Chronic kidney disease, stage 3 (moderate): Secondary | ICD-10-CM | POA: Diagnosis not present

## 2014-11-16 DIAGNOSIS — D649 Anemia, unspecified: Secondary | ICD-10-CM | POA: Diagnosis not present

## 2014-11-16 DIAGNOSIS — I129 Hypertensive chronic kidney disease with stage 1 through stage 4 chronic kidney disease, or unspecified chronic kidney disease: Secondary | ICD-10-CM | POA: Diagnosis not present

## 2014-11-16 DIAGNOSIS — Z79899 Other long term (current) drug therapy: Secondary | ICD-10-CM | POA: Diagnosis not present

## 2014-11-17 DIAGNOSIS — H2513 Age-related nuclear cataract, bilateral: Secondary | ICD-10-CM | POA: Diagnosis not present

## 2014-11-18 ENCOUNTER — Ambulatory Visit (INDEPENDENT_AMBULATORY_CARE_PROVIDER_SITE_OTHER): Payer: Medicare Other | Admitting: Emergency Medicine

## 2014-11-18 ENCOUNTER — Encounter: Payer: Self-pay | Admitting: Emergency Medicine

## 2014-11-18 VITALS — BP 140/70 | HR 64 | Wt 116.0 lb

## 2014-11-18 DIAGNOSIS — J8410 Pulmonary fibrosis, unspecified: Secondary | ICD-10-CM

## 2014-11-18 DIAGNOSIS — J479 Bronchiectasis, uncomplicated: Secondary | ICD-10-CM

## 2014-11-18 NOTE — Progress Notes (Signed)
Subjective:    Patient ID: Paula Hartman, female    DOB: [birth year], 74 y.o.   MRN: [id number]  HPI 74 yo woman, never smoker, native of the [place]. 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 Sent home on O2 that was reportedly weaned to off in a few months. No real problems since. 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 No other hospitalizations. She has had PFT and imaging before.   She presents today to establish care. Not having any acute problems. No reported hx RA, SLE, other inflammatory disease. No aspiration hx or current signs, no dysphagia. She had a remote TB exposure as a girl, and thinks she had BCG in the [place]. Has never been on amiodarone, MTX or other meds associated with ILD.   ROV 01/30/10 -- returns for f/u today for ILD. No new problems. Has had PFT today as below.  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 She reports that she remains active, is able to do her usual activities. She will get fatigued if she climbs a lot of stairs. Clinically stable. No hospitalizations. No apparent flares.   ROV 02/10/13 -- follow up for idiopathic ILD. She is doing well, has no dyspnea, no cough. Functional capacity is good. No reported flares.  ROV 07/27/13 -- follows up today to review repeat CT scan performed 01/2013 to follow her idiopathic ILD. 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 No LAD. No new issues, no new cough, dyspnea. She does complain of itching on her hands and arms.   ROV 11/18/14 -- follow-up visit for interstitial lung disease with some associated traction bronchiectasis. Her last CT scan of the chest was 02/16/13. She does not have significant obstructive lung disease on spirometry.  She has been feeling well. No breathing problems - although she later admits to dyspnea w fast walking w  her family. No cough.    PULMONARY FUNCTON TEST 01/30/2010 02/10/2013  FVC 1.77 1.72  FEV1 1.5 1.56  FEV1/FVC 84.7 90.7  FVC  % Predicted 78 88  FEV % Predicted 96 108  FeF 25-75 2.12 1.72  FeF 25-75 % Predicted 1.99 1.84    Pulmonary Function Test  Date: 01/30/2010  Height (in.): 59  Gender: Female  Pre-Spirometry  FVC Value: 1.77 L/min Pred: 2.25 L/min % Pred: 78 %  FEV1 Value: 1.50 L Pred: 1.57 L % Pred: 96 %  FEV1/FVC Value: 85 % Pred: 72 % FEF 25-75 Value: 2.12 L/min Pred: 1.99 L/min % Pred: 106 %   Post-Spirometry  FVC Value: 1.83 L/min Pred: 2.25 L/min % Pred: 81 %  FEV1 Value: 1.56 L Pred: 1.57 L % Pred: 100 %  FEV1/FVC Value: 86 % Pred: 72 % FEF 25-75 Value: 2.23 L/min Pred: 1.99 L/min % Pred: 112 %   Lung Volumes  TLC Value: 2.77 L % Pred: 73 %  RV Value: 1.00 L % Pred: 65 %  DLCO Value: 10.1 % % Pred: 68 %  DLCO/VA Value: 4.27 % % Pred: 123 %   Review of Systems  Constitutional: Negative for fever, chills, diaphoresis, activity change, appetite change, fatigue and unexpected weight change.  HENT: Positive for facial swelling and sore throat. Negative for congestion, dental problem, mouth sores, nosebleeds, postnasal drip, rhinorrhea, sinus pressure, sneezing, tinnitus, trouble swallowing and voice change.  Eyes: Negative for visual disturbance.  Respiratory: Positive for shortness of breath. Negative for apnea, cough, choking, chest tightness, wheezing and stridor.   Cardiovascular: Negative for chest pain, palpitations and leg swelling.  Gastrointestinal: Negative for nausea, vomiting, abdominal pain, constipation, blood in stool and abdominal distention.  Genitourinary: Negative for difficulty urinating.  Musculoskeletal: Negative for myalgias, back pain, joint swelling, arthralgias and gait problem.  Skin: Negative for rash.  Neurological: Positive for headaches. Negative for dizziness, tremors, seizures, syncope, speech difficulty, weakness, light-headedness  and numbness.  Hematological: Does not bruise/bleed easily.  Psychiatric/Behavioral: Negative for confusion, sleep disturbance and agitation. The patient is not nervous/anxious.        Objective:   Physical Exam Filed Vitals:   11/18/14 1341  BP: 140/70  Pulse: 64   Gen: Pleasant, well-appearing elderly woman, in no distress,  normal affect  ENT: No lesions,  mouth clear,  oropharynx clear, no postnasal drip  Neck: No JVD, no TMG, no carotid bruits  Lungs: No use of accessory muscles, soft, fine insp crackles L base  Cardiovascular: RRR, heart sounds normal, no murmur or gallops, no peripheral edema  Musculoskeletal: No deformities, no cyanosis or clubbing  Neuro: alert, non focal  Skin: Warm, no lesions or rashes      Assessment & Plan:  No problem-specific assessment & plan notes found for this encounter.

## 2014-11-18 NOTE — Patient Instructions (Signed)
We will repeat your CT chest now to compare with your prior Please continue your exercise  Follow with Dr Lamonte Sakai next available after the CT scan to review.

## 2014-11-21 DIAGNOSIS — I10 Essential (primary) hypertension: Secondary | ICD-10-CM | POA: Diagnosis not present

## 2014-11-21 DIAGNOSIS — M810 Age-related osteoporosis without current pathological fracture: Secondary | ICD-10-CM | POA: Diagnosis not present

## 2014-11-21 DIAGNOSIS — Z23 Encounter for immunization: Secondary | ICD-10-CM | POA: Diagnosis not present

## 2014-11-21 DIAGNOSIS — N183 Chronic kidney disease, stage 3 (moderate): Secondary | ICD-10-CM | POA: Diagnosis not present

## 2014-11-21 DIAGNOSIS — Z0001 Encounter for general adult medical examination with abnormal findings: Secondary | ICD-10-CM | POA: Diagnosis not present

## 2014-11-21 DIAGNOSIS — Z136 Encounter for screening for cardiovascular disorders: Secondary | ICD-10-CM | POA: Diagnosis not present

## 2014-11-21 DIAGNOSIS — M179 Osteoarthritis of knee, unspecified: Secondary | ICD-10-CM | POA: Diagnosis not present

## 2014-11-21 DIAGNOSIS — R040 Epistaxis: Secondary | ICD-10-CM | POA: Diagnosis not present

## 2014-11-22 ENCOUNTER — Other Ambulatory Visit: Payer: Medicare Other

## 2014-11-22 ENCOUNTER — Ambulatory Visit (INDEPENDENT_AMBULATORY_CARE_PROVIDER_SITE_OTHER)
Admission: RE | Admit: 2014-11-22 | Discharge: 2014-11-22 | Disposition: A | Discharge status: Home / Self Care | Payer: Medicare Other | Source: Ambulatory Visit | Attending: Emergency Medicine | Admitting: Emergency Medicine

## 2014-11-22 DIAGNOSIS — L309 Dermatitis, unspecified: Secondary | ICD-10-CM | POA: Diagnosis not present

## 2014-11-22 DIAGNOSIS — J8410 Pulmonary fibrosis, unspecified: Secondary | ICD-10-CM | POA: Diagnosis not present

## 2014-11-22 DIAGNOSIS — J479 Bronchiectasis, uncomplicated: Secondary | ICD-10-CM | POA: Diagnosis not present

## 2014-11-22 DIAGNOSIS — L820 Inflamed seborrheic keratosis: Secondary | ICD-10-CM | POA: Diagnosis not present

## 2014-11-22 DIAGNOSIS — J984 Other disorders of lung: Secondary | ICD-10-CM | POA: Diagnosis not present

## 2014-11-22 DIAGNOSIS — L603 Nail dystrophy: Secondary | ICD-10-CM | POA: Diagnosis not present

## 2014-11-22 DIAGNOSIS — L281 Prurigo nodularis: Secondary | ICD-10-CM | POA: Diagnosis not present

## 2014-11-24 DIAGNOSIS — Z1211 Encounter for screening for malignant neoplasm of colon: Secondary | ICD-10-CM | POA: Diagnosis not present

## 2014-12-05 DIAGNOSIS — E875 Hyperkalemia: Secondary | ICD-10-CM | POA: Diagnosis not present

## 2014-12-23 ENCOUNTER — Ambulatory Visit: Payer: Medicare Other | Admitting: Emergency Medicine

## 2015-01-05 ENCOUNTER — Ambulatory Visit: Payer: Medicare Other | Admitting: Emergency Medicine

## 2015-01-24 ENCOUNTER — Telehealth: Payer: Self-pay | Admitting: Emergency Medicine

## 2015-01-24 NOTE — Telephone Encounter (Signed)
Result Note     I haven't seen her in follow up to review CT - Please let her know that there has been no significant change or progression since her prior in 01/2013   I spoke with patient about results and she verbalized understanding and had no questions

## 2015-03-28 DIAGNOSIS — E785 Hyperlipidemia, unspecified: Secondary | ICD-10-CM | POA: Diagnosis not present

## 2015-03-28 DIAGNOSIS — M179 Osteoarthritis of knee, unspecified: Secondary | ICD-10-CM | POA: Diagnosis not present

## 2015-03-28 DIAGNOSIS — Z23 Encounter for immunization: Secondary | ICD-10-CM | POA: Diagnosis not present

## 2015-03-28 DIAGNOSIS — D631 Anemia in chronic kidney disease: Secondary | ICD-10-CM | POA: Diagnosis not present

## 2015-03-28 DIAGNOSIS — N183 Chronic kidney disease, stage 3 (moderate): Secondary | ICD-10-CM | POA: Diagnosis not present

## 2015-03-28 DIAGNOSIS — I10 Essential (primary) hypertension: Secondary | ICD-10-CM | POA: Diagnosis not present

## 2015-11-01 NOTE — Assessment & Plan Note (Signed)
We will repeat your CT chest now to compare with your prior Please continue your exercise  Follow with Dr Lamonte Sakai next available after the CT scan to review.

## 2016-02-22 DIAGNOSIS — M179 Osteoarthritis of knee, unspecified: Secondary | ICD-10-CM | POA: Diagnosis not present

## 2016-02-22 DIAGNOSIS — Z23 Encounter for immunization: Secondary | ICD-10-CM | POA: Diagnosis not present

## 2016-02-22 DIAGNOSIS — E7800 Pure hypercholesterolemia, unspecified: Secondary | ICD-10-CM | POA: Diagnosis not present

## 2016-02-22 DIAGNOSIS — N183 Chronic kidney disease, stage 3 (moderate): Secondary | ICD-10-CM | POA: Diagnosis not present

## 2016-02-22 DIAGNOSIS — D631 Anemia in chronic kidney disease: Secondary | ICD-10-CM | POA: Diagnosis not present

## 2016-02-22 DIAGNOSIS — I10 Essential (primary) hypertension: Secondary | ICD-10-CM | POA: Diagnosis not present

## 2016-02-22 DIAGNOSIS — Z0001 Encounter for general adult medical examination with abnormal findings: Secondary | ICD-10-CM | POA: Diagnosis not present

## 2016-02-22 DIAGNOSIS — N951 Menopausal and female climacteric states: Secondary | ICD-10-CM | POA: Diagnosis not present

## 2016-02-22 DIAGNOSIS — Z79899 Other long term (current) drug therapy: Secondary | ICD-10-CM | POA: Diagnosis not present

## 2016-02-22 DIAGNOSIS — J849 Interstitial pulmonary disease, unspecified: Secondary | ICD-10-CM | POA: Diagnosis not present

## 2016-02-29 DIAGNOSIS — Z1211 Encounter for screening for malignant neoplasm of colon: Secondary | ICD-10-CM | POA: Diagnosis not present

## 2016-03-25 ENCOUNTER — Encounter: Payer: Self-pay | Admitting: Emergency Medicine

## 2016-03-25 ENCOUNTER — Ambulatory Visit (INDEPENDENT_AMBULATORY_CARE_PROVIDER_SITE_OTHER): Payer: Medicare Other | Admitting: Emergency Medicine

## 2016-03-25 DIAGNOSIS — J8410 Pulmonary fibrosis, unspecified: Secondary | ICD-10-CM

## 2016-03-25 MED ORDER — FLUTICASONE PROPIONATE 50 MCG/ACT NA SUSP: 2.0000 | Freq: Every day | NASAL | 5 refills | Status: DC

## 2016-03-25 NOTE — Progress Notes (Signed)
Subjective:    Patient ID: Paula Hartman, female    DOB: November 17, 1940, 75 y.o.   MRN: [id number]  HPI 75 yo woman, never smoker, native of the [place]. 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 Sent home on O2 that was reportedly weaned to off in a few months. No real problems since. 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 No other hospitalizations. She has had PFT and imaging before.   She presents today to establish care. Not having any acute problems. No reported hx RA, SLE, other inflammatory disease. No aspiration hx or current signs, no dysphagia. She had a remote TB exposure as a girl, and thinks she had BCG in the [place]. Has never been on amiodarone, MTX or other meds associated with ILD.   ROV 01/30/10 -- returns for f/u today for ILD. No new problems. Has had PFT today as below.  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 She reports that she remains active, is able to do her usual activities. She will get fatigued if she climbs a lot of stairs. Clinically stable. No hospitalizations. No apparent flares.   ROV 02/10/13 -- follow up for idiopathic ILD. She is doing well, has no dyspnea, no cough. Functional capacity is good. No reported flares.  ROV 07/27/13 -- follows up today to review repeat CT scan performed 01/2013 to follow her idiopathic ILD. 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 No LAD. No new issues, no new cough, dyspnea. She does complain of itching on her hands and arms.   ROV 11/18/14 -- follow-up visit for interstitial lung disease with some associated traction bronchiectasis. Her last CT scan of the chest was 02/16/13. She does not have significant obstructive lung disease on spirometry.  She has been feeling well. No breathing problems - although she later admits to dyspnea w fast walking w  her family. No cough.   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 She had a repeat Ct chest 11/23/15 that showed no interval change in her pulmonary parenchymal pattern of subpleural fibrosis compared with 02/16/13.  She reports that she is doing well. She can walk the dog, does water aerobics. Her statin was changed to an alternative, no other med changes. Minimal cough, some night time congestion. She has tried saline solution. She uses loratadine prn, rarely.    PULMONARY FUNCTON TEST 01/30/2010 02/10/2013  FVC 1.77 1.72  FEV1 1.5 1.56  FEV1/FVC 84.7 90.7  FVC  % Predicted 78 88  FEV % Predicted 96 108  FeF 25-75 2.12 1.72  FeF 25-75 % Predicted 1.99 1.84    Pulmonary Function Test  Date: 01/30/2010  Height (in.): 59  Gender: Female  Pre-Spirometry  FVC Value: 1.77 L/min Pred: 2.25 L/min % Pred: 78 %  FEV1 Value: 1.50 L Pred: 1.57 L % Pred: 96 %  FEV1/FVC Value: 85 % Pred: 72 % FEF 25-75 Value: 2.12 L/min Pred: 1.99 L/min % Pred: 106 %   Post-Spirometry  FVC Value: 1.83 L/min Pred: 2.25 L/min % Pred: 81 %  FEV1 Value: 1.56 L Pred: 1.57 L % Pred: 100 %  FEV1/FVC Value: 86 % Pred: 72 % FEF 25-75 Value: 2.23 L/min Pred: 1.99 L/min %  Pred: 112 %   Lung Volumes  TLC Value: 2.77 L % Pred: 73 %  RV Value: 1.00 L % Pred: 65 %  DLCO Value: 10.1 % % Pred: 68 %  DLCO/VA Value: 4.27 % % Pred: 123 %   Review of Systems  Constitutional: Negative for activity change, appetite change, chills, diaphoresis, fatigue, fever and unexpected weight change.  HENT: Positive for facial swelling and sore throat. Negative for congestion, dental problem, mouth sores, nosebleeds, postnasal drip, rhinorrhea, sinus pressure, sneezing, tinnitus, trouble swallowing and voice change.   Eyes: Negative for visual disturbance.  Respiratory: Positive for shortness of breath. Negative for  apnea, cough, choking, chest tightness, wheezing and stridor.   Cardiovascular: Negative for chest pain, palpitations and leg swelling.  Gastrointestinal: Negative for abdominal distention, abdominal pain, blood in stool, constipation, nausea and vomiting.  Genitourinary: Negative for difficulty urinating.  Musculoskeletal: Negative for arthralgias, back pain, gait problem, joint swelling and myalgias.  Skin: Negative for rash.  Neurological: Positive for headaches. Negative for dizziness, tremors, seizures, syncope, speech difficulty, weakness, light-headedness and numbness.  Hematological: Does not bruise/bleed easily.  Psychiatric/Behavioral: Negative for agitation, confusion and sleep disturbance. The patient is not nervous/anxious.        Objective:   Physical Exam Vitals:   03/25/16 1053  BP: 138/72  Pulse: 60   Gen: Pleasant, well-appearing elderly woman, in no distress,  normal affect  ENT: No lesions,  mouth clear,  oropharynx clear, no postnasal drip  Neck: No JVD, no TMG, no carotid bruits  Lungs: No use of accessory muscles, soft, fine insp crackles L base  Cardiovascular: RRR, heart sounds normal, no murmur or gallops, no peripheral edema  Musculoskeletal: No deformities, no cyanosis or clubbing  Neuro: alert, non focal  Skin: Warm, no lesions or rashes      Assessment & Plan:  ALLERGIC RHINITIS We will add fluticasone nasal spray. She is using loratadine prn.   INTERSTITIAL LUNG DISEASE Stable on most recent imaging which was 1.5 yrs ago. clinically stable. Will not plan repeat CT at this time. Will plan to repeat this or CXR depending on her sx.   Baltazar Apo, MD, PhD 03/25/2016, 11:18 AM [facility] [phone] or if no answer [phone]

## 2016-03-25 NOTE — Addendum Note (Signed)
Addended by: Desmond Dike C on: 03/25/2016 11:19 AM   Modules accepted: Orders

## 2016-03-25 NOTE — Patient Instructions (Signed)
Your CT scan in 2016 was stable  Start fluticasone nasal spray, 2 sprays each nostril each evening a few hours before bedtime.  Continue loratadine 10mg  (Claritin) as needed for congestion Follow with Dr Lamonte Sakai in 12 months or sooner if you have any problems

## 2016-03-25 NOTE — Assessment & Plan Note (Signed)
Stable on most recent imaging which was 1.5 yrs ago. clinically stable. Will not plan repeat CT at this time. Will plan to repeat this or CXR depending on her sx.

## 2016-03-25 NOTE — Assessment & Plan Note (Signed)
We will add fluticasone nasal spray. She is using loratadine prn.

## 2016-03-27 DIAGNOSIS — M810 Age-related osteoporosis without current pathological fracture: Secondary | ICD-10-CM | POA: Diagnosis not present

## 2016-03-27 DIAGNOSIS — Z780 Asymptomatic menopausal state: Secondary | ICD-10-CM | POA: Diagnosis not present

## 2016-03-27 DIAGNOSIS — R799 Abnormal finding of blood chemistry, unspecified: Secondary | ICD-10-CM | POA: Diagnosis not present

## 2016-03-27 DIAGNOSIS — Z79899 Other long term (current) drug therapy: Secondary | ICD-10-CM | POA: Diagnosis not present

## 2016-03-27 DIAGNOSIS — N183 Chronic kidney disease, stage 3 (moderate): Secondary | ICD-10-CM | POA: Diagnosis not present

## 2016-03-27 DIAGNOSIS — I129 Hypertensive chronic kidney disease with stage 1 through stage 4 chronic kidney disease, or unspecified chronic kidney disease: Secondary | ICD-10-CM | POA: Diagnosis not present

## 2016-03-27 DIAGNOSIS — D631 Anemia in chronic kidney disease: Secondary | ICD-10-CM | POA: Diagnosis not present

## 2016-03-27 DIAGNOSIS — N060 Isolated proteinuria with minor glomerular abnormality: Secondary | ICD-10-CM | POA: Diagnosis not present

## 2016-04-03 DIAGNOSIS — R809 Proteinuria, unspecified: Secondary | ICD-10-CM | POA: Diagnosis not present

## 2016-04-15 ENCOUNTER — Other Ambulatory Visit: Payer: Self-pay | Admitting: Family Medicine

## 2016-04-15 ENCOUNTER — Ambulatory Visit
Admission: RE | Admit: 2016-04-15 | Discharge: 2016-04-15 | Disposition: A | Discharge status: Home / Self Care | Payer: Medicare Other | Source: Ambulatory Visit | Attending: Family Medicine | Admitting: Family Medicine

## 2016-04-15 DIAGNOSIS — M21959 Unspecified acquired deformity of unspecified thigh: Secondary | ICD-10-CM

## 2016-04-15 DIAGNOSIS — M160 Bilateral primary osteoarthritis of hip: Secondary | ICD-10-CM | POA: Diagnosis not present

## 2016-05-21 DIAGNOSIS — J069 Acute upper respiratory infection, unspecified: Secondary | ICD-10-CM | POA: Diagnosis not present

## 2016-07-03 DIAGNOSIS — H2513 Age-related nuclear cataract, bilateral: Secondary | ICD-10-CM | POA: Diagnosis not present

## 2016-07-03 DIAGNOSIS — H04123 Dry eye syndrome of bilateral lacrimal glands: Secondary | ICD-10-CM | POA: Diagnosis not present

## 2016-07-12 DIAGNOSIS — J310 Chronic rhinitis: Secondary | ICD-10-CM | POA: Diagnosis not present

## 2016-07-12 DIAGNOSIS — J343 Hypertrophy of nasal turbinates: Secondary | ICD-10-CM | POA: Diagnosis not present

## 2016-07-16 DIAGNOSIS — I872 Venous insufficiency (chronic) (peripheral): Secondary | ICD-10-CM | POA: Diagnosis not present

## 2016-07-16 DIAGNOSIS — I8312 Varicose veins of left lower extremity with inflammation: Secondary | ICD-10-CM | POA: Diagnosis not present

## 2016-07-16 DIAGNOSIS — I8311 Varicose veins of right lower extremity with inflammation: Secondary | ICD-10-CM | POA: Diagnosis not present

## 2016-07-16 DIAGNOSIS — L300 Nummular dermatitis: Secondary | ICD-10-CM | POA: Diagnosis not present

## 2016-07-17 DIAGNOSIS — J209 Acute bronchitis, unspecified: Secondary | ICD-10-CM | POA: Diagnosis not present

## 2016-07-17 DIAGNOSIS — J849 Interstitial pulmonary disease, unspecified: Secondary | ICD-10-CM | POA: Diagnosis not present

## 2017-02-25 ENCOUNTER — Ambulatory Visit (INDEPENDENT_AMBULATORY_CARE_PROVIDER_SITE_OTHER): Payer: Medicare Other | Admitting: Emergency Medicine

## 2017-02-25 ENCOUNTER — Encounter: Payer: Self-pay | Admitting: Emergency Medicine

## 2017-02-25 ENCOUNTER — Ambulatory Visit (INDEPENDENT_AMBULATORY_CARE_PROVIDER_SITE_OTHER)
Admission: RE | Admit: 2017-02-25 | Discharge: 2017-02-25 | Disposition: A | Discharge status: Home / Self Care | Payer: Medicare Other | Source: Ambulatory Visit | Attending: Emergency Medicine | Admitting: Emergency Medicine

## 2017-02-25 VITALS — BP 124/60 | HR 70 | Ht 59.0 in | Wt 116.0 lb

## 2017-02-25 DIAGNOSIS — J849 Interstitial pulmonary disease, unspecified: Secondary | ICD-10-CM

## 2017-02-25 DIAGNOSIS — Z23 Encounter for immunization: Secondary | ICD-10-CM

## 2017-02-25 DIAGNOSIS — J8410 Pulmonary fibrosis, unspecified: Secondary | ICD-10-CM

## 2017-02-25 DIAGNOSIS — J301 Allergic rhinitis due to pollen: Secondary | ICD-10-CM | POA: Diagnosis not present

## 2017-02-25 DIAGNOSIS — R918 Other nonspecific abnormal finding of lung field: Secondary | ICD-10-CM | POA: Diagnosis not present

## 2017-02-25 NOTE — Assessment & Plan Note (Signed)
Discussed roles of atrovent NS, reinitiation flonase, loratadine.

## 2017-02-25 NOTE — Patient Instructions (Signed)
We will perform a chest x-ray today Fiu shot today If your nasal congestion increases this Fall then you can restart your Atrovent nasal spray. You could also consider restarting fluticasone nasal spray or loratadine (Claritin) to prevent allergy symptoms.  Follow with Dr Lamonte Sakai in 12 months or sooner if you have any problems

## 2017-02-25 NOTE — Assessment & Plan Note (Addendum)
Overall clinically stable. Do not feel compelled to repeat her Ct chest at this time. Will repeat CXR and follow. Consider PFT and CT chest next year or sooner if clinical status or CXR change Flu shot today

## 2017-02-25 NOTE — Progress Notes (Signed)
Subjective:    Patient ID: Paula Hartman, female    DOB: July 09, 1940, 76 y.o.   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 She had a repeat Ct chest 11/23/15 that showed no interval change in her pulmonary parenchymal pattern of subpleural fibrosis compared with 02/16/13.  She reports that she is doing well. She can walk the dog, does water aerobics. Her statin was changed to an alternative, no other med changes. Minimal cough, some night time congestion. She has tried saline solution. She uses loratadine prn, rarely.   ROV 02/25/17 -- This is an annual follow-up visit for patient with a history of interstitial lung disease, idiopathic and in an NSIP pattern. She also has a history of chronic cough and allergic rhinitis. She returns today and reports that her breathing is unchanged. She is limited a bit by LE pain, myalgias. Her statin was changed to pravachol, she doesn't believe it is helping. Her breathing is not limiting. She does yoga, swimming. Minimal cough. She was recently tried on atrovent NS, now off of it - no rhinitis at this time.    PULMONARY FUNCTON TEST 01/30/2010 02/10/2013  FVC 1.77 1.72  FEV1 1.5 1.56  FEV1/FVC 84.7 90.7  FVC  % Predicted 78 88  FEV % Predicted 96 108  FeF 25-75 2.12 1.72  FeF 25-75 % Predicted 1.99 1.84    Pulmonary Function Test  Date: 01/30/2010  Height (in.): 59  Gender: Female  Pre-Spirometry  FVC Value: 1.77 L/min Pred: 2.25 L/min % Pred: 78 %  FEV1 Value: 1.50 L Pred: 1.57 L % Pred: 96 %  FEV1/FVC Value: 85 % Pred: 72 % FEF 25-75 Value: 2.12 L/min Pred: 1.99 L/min % Pred: 106 %   Post-Spirometry  FVC Value: 1.83 L/min Pred: 2.25 L/min % Pred: 81 %  FEV1 Value: 1.56 L Pred: 1.57 L % Pred: 100 %  FEV1/FVC Value: 86 % Pred: 72 % FEF 25-75 Value: 2.23 L/min Pred: 1.99 L/min % Pred: 112 %   Lung  Volumes  TLC Value: 2.77 L % Pred: 73 %  RV Value: 1.00 L % Pred: 65 %  DLCO Value: 10.1 % % Pred: 68 %  DLCO/VA Value: 4.27 % % Pred: 123 %   Review of Systems  Constitutional: Negative for activity change, appetite change, chills, diaphoresis, fatigue, fever and unexpected weight change.  HENT: Positive for facial swelling and sore throat. Negative for congestion, dental problem, mouth sores, nosebleeds, postnasal drip, rhinorrhea, sinus pressure, sneezing, tinnitus, trouble swallowing and voice change.   Eyes: Negative for visual disturbance.  Respiratory: Positive for shortness of breath. Negative for apnea, cough, choking, chest tightness, wheezing and stridor.   Cardiovascular: Negative for chest pain, palpitations and leg swelling.  Gastrointestinal: Negative for abdominal distention, abdominal pain, blood in stool, constipation, nausea and vomiting.  Genitourinary: Negative for difficulty urinating.  Musculoskeletal: Negative for arthralgias, back pain, gait problem, joint swelling and myalgias.  Skin: Negative for rash.  Neurological: Positive for headaches. Negative for dizziness, tremors, seizures, syncope, speech difficulty, weakness, light-headedness and numbness.  Hematological: Does not bruise/bleed easily.  Psychiatric/Behavioral: Negative for agitation, confusion and sleep disturbance. The patient is not nervous/anxious.        Objective:   Physical Exam Vitals:   02/25/17 0943  BP: 124/60  Pulse: 70  SpO2: 98%   Gen: Pleasant, well-appearing  elderly woman, in no distress,  normal affect  ENT: No lesions,  mouth clear,  oropharynx clear, no postnasal drip  Neck: No JVD, no TMG, no carotid bruits  Lungs: No use of accessory muscles, soft, fine insp crackles L base  Cardiovascular: RRR, heart sounds normal, no murmur or gallops, no peripheral edema  Musculoskeletal: No deformities, no cyanosis or clubbing  Neuro: alert, non focal  Skin: Warm, no lesions or  rashes      Assessment & Plan:  INTERSTITIAL LUNG DISEASE Overall clinically stable. Do not feel compelled to repeat her Ct chest at this time. Will repeat CXR and follow. Consider PFT and CT chest next year or sooner if clinical status or CXR change Flu shot today  Allergic rhinitis Discussed roles of atrovent NS, reinitiation flonase, loratadine.   Baltazar Apo, MD, PhD 02/25/2017, 10:14 AM [facility] [phone] or if no answer [phone]

## 2017-03-04 DIAGNOSIS — H02839 Dermatochalasis of unspecified eye, unspecified eyelid: Secondary | ICD-10-CM | POA: Diagnosis not present

## 2017-03-04 DIAGNOSIS — H2511 Age-related nuclear cataract, right eye: Secondary | ICD-10-CM | POA: Diagnosis not present

## 2017-03-04 DIAGNOSIS — H2513 Age-related nuclear cataract, bilateral: Secondary | ICD-10-CM | POA: Diagnosis not present

## 2017-03-04 DIAGNOSIS — H25043 Posterior subcapsular polar age-related cataract, bilateral: Secondary | ICD-10-CM | POA: Diagnosis not present

## 2017-03-04 DIAGNOSIS — H25013 Cortical age-related cataract, bilateral: Secondary | ICD-10-CM | POA: Diagnosis not present

## 2017-03-12 DIAGNOSIS — M179 Osteoarthritis of knee, unspecified: Secondary | ICD-10-CM | POA: Diagnosis not present

## 2017-03-12 DIAGNOSIS — I10 Essential (primary) hypertension: Secondary | ICD-10-CM | POA: Diagnosis not present

## 2017-03-12 DIAGNOSIS — D631 Anemia in chronic kidney disease: Secondary | ICD-10-CM | POA: Diagnosis not present

## 2017-03-12 DIAGNOSIS — N183 Chronic kidney disease, stage 3 (moderate): Secondary | ICD-10-CM | POA: Diagnosis not present

## 2017-03-12 DIAGNOSIS — Z0001 Encounter for general adult medical examination with abnormal findings: Secondary | ICD-10-CM | POA: Diagnosis not present

## 2017-03-12 DIAGNOSIS — E7800 Pure hypercholesterolemia, unspecified: Secondary | ICD-10-CM | POA: Diagnosis not present

## 2017-03-12 DIAGNOSIS — Z1159 Encounter for screening for other viral diseases: Secondary | ICD-10-CM | POA: Diagnosis not present

## 2017-03-12 DIAGNOSIS — D6489 Other specified anemias: Secondary | ICD-10-CM | POA: Diagnosis not present

## 2017-03-12 DIAGNOSIS — R748 Abnormal levels of other serum enzymes: Secondary | ICD-10-CM | POA: Diagnosis not present

## 2017-03-12 DIAGNOSIS — J849 Interstitial pulmonary disease, unspecified: Secondary | ICD-10-CM | POA: Diagnosis not present

## 2017-03-12 DIAGNOSIS — E782 Mixed hyperlipidemia: Secondary | ICD-10-CM | POA: Diagnosis not present

## 2017-03-12 DIAGNOSIS — Z79899 Other long term (current) drug therapy: Secondary | ICD-10-CM | POA: Diagnosis not present

## 2017-03-12 DIAGNOSIS — M791 Myalgia, unspecified site: Secondary | ICD-10-CM | POA: Diagnosis not present

## 2017-03-24 DIAGNOSIS — H2511 Age-related nuclear cataract, right eye: Secondary | ICD-10-CM | POA: Diagnosis not present

## 2017-03-25 DIAGNOSIS — H2512 Age-related nuclear cataract, left eye: Secondary | ICD-10-CM | POA: Diagnosis not present

## 2017-03-26 DIAGNOSIS — E7800 Pure hypercholesterolemia, unspecified: Secondary | ICD-10-CM | POA: Diagnosis not present

## 2017-04-21 DIAGNOSIS — H2512 Age-related nuclear cataract, left eye: Secondary | ICD-10-CM | POA: Diagnosis not present

## 2017-05-29 DIAGNOSIS — E7800 Pure hypercholesterolemia, unspecified: Secondary | ICD-10-CM | POA: Diagnosis not present

## 2017-06-02 DIAGNOSIS — R809 Proteinuria, unspecified: Secondary | ICD-10-CM | POA: Diagnosis not present

## 2017-06-02 DIAGNOSIS — N183 Chronic kidney disease, stage 3 (moderate): Secondary | ICD-10-CM | POA: Diagnosis not present

## 2017-06-02 DIAGNOSIS — I129 Hypertensive chronic kidney disease with stage 1 through stage 4 chronic kidney disease, or unspecified chronic kidney disease: Secondary | ICD-10-CM | POA: Diagnosis not present

## 2017-06-18 ENCOUNTER — Other Ambulatory Visit: Payer: Self-pay | Admitting: Family Medicine

## 2017-06-18 DIAGNOSIS — Z1231 Encounter for screening mammogram for malignant neoplasm of breast: Secondary | ICD-10-CM

## 2017-06-24 DIAGNOSIS — N183 Chronic kidney disease, stage 3 (moderate): Secondary | ICD-10-CM | POA: Diagnosis not present

## 2017-06-24 DIAGNOSIS — I129 Hypertensive chronic kidney disease with stage 1 through stage 4 chronic kidney disease, or unspecified chronic kidney disease: Secondary | ICD-10-CM | POA: Diagnosis not present

## 2017-06-27 DIAGNOSIS — I10 Essential (primary) hypertension: Secondary | ICD-10-CM | POA: Diagnosis not present

## 2017-06-27 DIAGNOSIS — D631 Anemia in chronic kidney disease: Secondary | ICD-10-CM | POA: Diagnosis not present

## 2017-06-27 DIAGNOSIS — M179 Osteoarthritis of knee, unspecified: Secondary | ICD-10-CM | POA: Diagnosis not present

## 2017-06-27 DIAGNOSIS — N183 Chronic kidney disease, stage 3 (moderate): Secondary | ICD-10-CM | POA: Diagnosis not present

## 2017-06-27 DIAGNOSIS — J849 Interstitial pulmonary disease, unspecified: Secondary | ICD-10-CM | POA: Diagnosis not present

## 2017-06-27 DIAGNOSIS — E782 Mixed hyperlipidemia: Secondary | ICD-10-CM | POA: Diagnosis not present

## 2017-07-08 DIAGNOSIS — I129 Hypertensive chronic kidney disease with stage 1 through stage 4 chronic kidney disease, or unspecified chronic kidney disease: Secondary | ICD-10-CM | POA: Diagnosis not present

## 2017-07-08 DIAGNOSIS — I10 Essential (primary) hypertension: Secondary | ICD-10-CM | POA: Diagnosis not present

## 2017-07-14 ENCOUNTER — Ambulatory Visit
Admission: RE | Admit: 2017-07-14 | Discharge: 2017-07-14 | Disposition: A | Discharge status: Home / Self Care | Payer: Medicare Other | Source: Ambulatory Visit | Attending: Family Medicine | Admitting: Family Medicine

## 2017-07-14 DIAGNOSIS — Z1231 Encounter for screening mammogram for malignant neoplasm of breast: Secondary | ICD-10-CM

## 2017-07-16 DIAGNOSIS — L300 Nummular dermatitis: Secondary | ICD-10-CM | POA: Diagnosis not present

## 2017-07-16 DIAGNOSIS — L299 Pruritus, unspecified: Secondary | ICD-10-CM | POA: Diagnosis not present

## 2017-11-14 DIAGNOSIS — I10 Essential (primary) hypertension: Secondary | ICD-10-CM | POA: Diagnosis not present

## 2017-11-14 DIAGNOSIS — E782 Mixed hyperlipidemia: Secondary | ICD-10-CM | POA: Diagnosis not present

## 2017-11-14 DIAGNOSIS — M179 Osteoarthritis of knee, unspecified: Secondary | ICD-10-CM | POA: Diagnosis not present

## 2017-11-14 DIAGNOSIS — E7800 Pure hypercholesterolemia, unspecified: Secondary | ICD-10-CM | POA: Diagnosis not present

## 2017-11-14 DIAGNOSIS — D631 Anemia in chronic kidney disease: Secondary | ICD-10-CM | POA: Diagnosis not present

## 2017-11-14 DIAGNOSIS — N183 Chronic kidney disease, stage 3 (moderate): Secondary | ICD-10-CM | POA: Diagnosis not present

## 2017-12-16 DIAGNOSIS — H16223 Keratoconjunctivitis sicca, not specified as Sjogren's, bilateral: Secondary | ICD-10-CM | POA: Diagnosis not present

## 2017-12-29 DIAGNOSIS — N183 Chronic kidney disease, stage 3 (moderate): Secondary | ICD-10-CM | POA: Diagnosis not present

## 2017-12-29 DIAGNOSIS — R21 Rash and other nonspecific skin eruption: Secondary | ICD-10-CM | POA: Diagnosis not present

## 2017-12-29 DIAGNOSIS — I10 Essential (primary) hypertension: Secondary | ICD-10-CM | POA: Diagnosis not present

## 2018-01-30 DIAGNOSIS — N183 Chronic kidney disease, stage 3 (moderate): Secondary | ICD-10-CM | POA: Diagnosis not present

## 2018-02-05 DIAGNOSIS — I129 Hypertensive chronic kidney disease with stage 1 through stage 4 chronic kidney disease, or unspecified chronic kidney disease: Secondary | ICD-10-CM | POA: Diagnosis not present

## 2018-02-05 DIAGNOSIS — R809 Proteinuria, unspecified: Secondary | ICD-10-CM | POA: Diagnosis not present

## 2018-02-05 DIAGNOSIS — N183 Chronic kidney disease, stage 3 (moderate): Secondary | ICD-10-CM | POA: Diagnosis not present

## 2018-02-11 DIAGNOSIS — Z23 Encounter for immunization: Secondary | ICD-10-CM | POA: Diagnosis not present

## 2018-03-19 DIAGNOSIS — Z79899 Other long term (current) drug therapy: Secondary | ICD-10-CM | POA: Diagnosis not present

## 2018-03-19 DIAGNOSIS — D631 Anemia in chronic kidney disease: Secondary | ICD-10-CM | POA: Diagnosis not present

## 2018-03-19 DIAGNOSIS — L309 Dermatitis, unspecified: Secondary | ICD-10-CM | POA: Diagnosis not present

## 2018-03-19 DIAGNOSIS — M179 Osteoarthritis of knee, unspecified: Secondary | ICD-10-CM | POA: Diagnosis not present

## 2018-03-19 DIAGNOSIS — I10 Essential (primary) hypertension: Secondary | ICD-10-CM | POA: Diagnosis not present

## 2018-03-19 DIAGNOSIS — N183 Chronic kidney disease, stage 3 (moderate): Secondary | ICD-10-CM | POA: Diagnosis not present

## 2018-03-19 DIAGNOSIS — J849 Interstitial pulmonary disease, unspecified: Secondary | ICD-10-CM | POA: Diagnosis not present

## 2018-03-19 DIAGNOSIS — E7800 Pure hypercholesterolemia, unspecified: Secondary | ICD-10-CM | POA: Diagnosis not present

## 2018-03-19 DIAGNOSIS — Z0001 Encounter for general adult medical examination with abnormal findings: Secondary | ICD-10-CM | POA: Diagnosis not present

## 2018-05-06 DIAGNOSIS — S161XXA Strain of muscle, fascia and tendon at neck level, initial encounter: Secondary | ICD-10-CM | POA: Diagnosis not present

## 2018-06-10 DIAGNOSIS — N183 Chronic kidney disease, stage 3 (moderate): Secondary | ICD-10-CM | POA: Diagnosis not present

## 2018-06-10 DIAGNOSIS — D539 Nutritional anemia, unspecified: Secondary | ICD-10-CM | POA: Diagnosis not present

## 2018-06-11 DIAGNOSIS — I129 Hypertensive chronic kidney disease with stage 1 through stage 4 chronic kidney disease, or unspecified chronic kidney disease: Secondary | ICD-10-CM | POA: Diagnosis not present

## 2018-06-11 DIAGNOSIS — R809 Proteinuria, unspecified: Secondary | ICD-10-CM | POA: Diagnosis not present

## 2018-06-11 DIAGNOSIS — N184 Chronic kidney disease, stage 4 (severe): Secondary | ICD-10-CM | POA: Diagnosis not present

## 2018-07-08 DIAGNOSIS — D692 Other nonthrombocytopenic purpura: Secondary | ICD-10-CM | POA: Diagnosis not present

## 2018-07-08 DIAGNOSIS — L298 Other pruritus: Secondary | ICD-10-CM | POA: Diagnosis not present

## 2018-07-20 DIAGNOSIS — N184 Chronic kidney disease, stage 4 (severe): Secondary | ICD-10-CM | POA: Diagnosis not present

## 2018-08-20 DIAGNOSIS — D692 Other nonthrombocytopenic purpura: Secondary | ICD-10-CM | POA: Diagnosis not present

## 2018-08-21 DIAGNOSIS — E875 Hyperkalemia: Secondary | ICD-10-CM | POA: Diagnosis not present

## 2018-08-26 DIAGNOSIS — M79605 Pain in left leg: Secondary | ICD-10-CM | POA: Diagnosis not present

## 2018-08-26 DIAGNOSIS — I10 Essential (primary) hypertension: Secondary | ICD-10-CM | POA: Diagnosis not present

## 2018-08-26 DIAGNOSIS — N183 Chronic kidney disease, stage 3 (moderate): Secondary | ICD-10-CM | POA: Diagnosis not present

## 2018-08-26 DIAGNOSIS — L259 Unspecified contact dermatitis, unspecified cause: Secondary | ICD-10-CM | POA: Diagnosis not present

## 2018-08-27 DIAGNOSIS — L309 Dermatitis, unspecified: Secondary | ICD-10-CM | POA: Diagnosis not present

## 2018-08-27 DIAGNOSIS — Z4802 Encounter for removal of sutures: Secondary | ICD-10-CM | POA: Diagnosis not present

## 2018-09-25 DIAGNOSIS — L309 Dermatitis, unspecified: Secondary | ICD-10-CM | POA: Diagnosis not present

## 2018-09-25 DIAGNOSIS — L810 Postinflammatory hyperpigmentation: Secondary | ICD-10-CM | POA: Diagnosis not present

## 2018-10-01 DIAGNOSIS — N184 Chronic kidney disease, stage 4 (severe): Secondary | ICD-10-CM | POA: Diagnosis not present

## 2018-10-13 DIAGNOSIS — J849 Interstitial pulmonary disease, unspecified: Secondary | ICD-10-CM | POA: Diagnosis not present

## 2018-10-13 DIAGNOSIS — N183 Chronic kidney disease, stage 3 (moderate): Secondary | ICD-10-CM | POA: Diagnosis not present

## 2018-10-13 DIAGNOSIS — I129 Hypertensive chronic kidney disease with stage 1 through stage 4 chronic kidney disease, or unspecified chronic kidney disease: Secondary | ICD-10-CM | POA: Diagnosis not present

## 2018-10-13 DIAGNOSIS — M545 Low back pain: Secondary | ICD-10-CM | POA: Diagnosis not present

## 2018-10-13 DIAGNOSIS — E7800 Pure hypercholesterolemia, unspecified: Secondary | ICD-10-CM | POA: Diagnosis not present

## 2018-10-13 DIAGNOSIS — M5416 Radiculopathy, lumbar region: Secondary | ICD-10-CM | POA: Diagnosis not present

## 2018-10-14 DIAGNOSIS — N184 Chronic kidney disease, stage 4 (severe): Secondary | ICD-10-CM | POA: Diagnosis not present

## 2018-10-14 DIAGNOSIS — I129 Hypertensive chronic kidney disease with stage 1 through stage 4 chronic kidney disease, or unspecified chronic kidney disease: Secondary | ICD-10-CM | POA: Diagnosis not present

## 2018-10-14 DIAGNOSIS — R809 Proteinuria, unspecified: Secondary | ICD-10-CM | POA: Diagnosis not present

## 2018-10-15 DIAGNOSIS — E7800 Pure hypercholesterolemia, unspecified: Secondary | ICD-10-CM | POA: Diagnosis not present

## 2018-10-15 DIAGNOSIS — Z1211 Encounter for screening for malignant neoplasm of colon: Secondary | ICD-10-CM | POA: Diagnosis not present

## 2018-10-26 DIAGNOSIS — M545 Low back pain: Secondary | ICD-10-CM | POA: Diagnosis not present

## 2018-10-30 DIAGNOSIS — M5416 Radiculopathy, lumbar region: Secondary | ICD-10-CM | POA: Diagnosis not present

## 2018-11-23 DIAGNOSIS — M5416 Radiculopathy, lumbar region: Secondary | ICD-10-CM | POA: Diagnosis not present

## 2018-11-24 ENCOUNTER — Other Ambulatory Visit: Payer: Self-pay

## 2018-11-24 ENCOUNTER — Encounter (HOSPITAL_COMMUNITY): Payer: Self-pay | Admitting: Emergency Medicine

## 2018-11-24 ENCOUNTER — Emergency Department (HOSPITAL_COMMUNITY)
Admission: EM | Admit: 2018-11-24 | Discharge: 2018-11-24 | Disposition: A | Discharge status: Home / Self Care | Payer: Medicare Other | Attending: Emergency Medicine | Admitting: Emergency Medicine

## 2018-11-24 DIAGNOSIS — M5442 Lumbago with sciatica, left side: Secondary | ICD-10-CM | POA: Diagnosis not present

## 2018-11-24 DIAGNOSIS — N189 Chronic kidney disease, unspecified: Secondary | ICD-10-CM | POA: Diagnosis not present

## 2018-11-24 DIAGNOSIS — Z79899 Other long term (current) drug therapy: Secondary | ICD-10-CM | POA: Insufficient documentation

## 2018-11-24 DIAGNOSIS — I129 Hypertensive chronic kidney disease with stage 1 through stage 4 chronic kidney disease, or unspecified chronic kidney disease: Secondary | ICD-10-CM | POA: Insufficient documentation

## 2018-11-24 DIAGNOSIS — M5430 Sciatica, unspecified side: Secondary | ICD-10-CM

## 2018-11-24 DIAGNOSIS — R52 Pain, unspecified: Secondary | ICD-10-CM | POA: Diagnosis not present

## 2018-11-24 DIAGNOSIS — I10 Essential (primary) hypertension: Secondary | ICD-10-CM | POA: Diagnosis not present

## 2018-11-24 DIAGNOSIS — M545 Low back pain: Secondary | ICD-10-CM | POA: Diagnosis present

## 2018-11-24 MED ORDER — KETOROLAC TROMETHAMINE 15 MG/ML IJ SOLN
15.0000 mg | Freq: Once | INTRAMUSCULAR | Status: AC
  Administered 2018-11-24: 15 mg via INTRAVENOUS
  Filled 2018-11-24: qty 1

## 2018-11-24 MED ORDER — DIAZEPAM 5 MG PO TABS
5.0000 mg | ORAL_TABLET | Freq: Once | ORAL | Status: AC
  Administered 2018-11-24: 5 mg via ORAL
  Filled 2018-11-24: qty 1

## 2018-11-24 MED ORDER — MORPHINE SULFATE (PF) 4 MG/ML IV SOLN
4.0000 mg | Freq: Once | INTRAVENOUS | Status: AC
  Administered 2018-11-24: 09:00:00 4 mg via INTRAVENOUS
  Filled 2018-11-24: qty 1

## 2018-11-24 MED ORDER — OXYCODONE-ACETAMINOPHEN 5-325 MG PO TABS
1.0000 | ORAL_TABLET | Freq: Once | ORAL | Status: AC
  Administered 2018-11-24: 14:00:00 1 via ORAL
  Filled 2018-11-24: qty 1

## 2018-11-24 MED ORDER — HYDROCODONE-ACETAMINOPHEN 5-325 MG PO TABS: 1.0000 | ORAL_TABLET | ORAL | 0 refills | Status: DC | PRN

## 2018-11-24 NOTE — ED Triage Notes (Signed)
Per EMS- pt here from home where she lives with daughter for worsening pain to back with known bulging disk, denies loss of bladder or bowel function.

## 2018-11-25 DIAGNOSIS — M5416 Radiculopathy, lumbar region: Secondary | ICD-10-CM | POA: Diagnosis not present

## 2018-11-25 DIAGNOSIS — M545 Low back pain: Secondary | ICD-10-CM | POA: Diagnosis not present

## 2018-11-25 NOTE — ED Provider Notes (Signed)
[facility]PART[facility] Provider Note   CSN: [id number] Arrival date & time: 11/24/18  2841     History   Chief Complaint Chief Complaint  Patient presents with  . Back Pain    HPI Paula Hartman is a 78 y.o. female.     HPI 78 year old female presents the emergency department with left lower back pain with radiation down her left buttock consistent with worsening sciatica.  She was recently diagnosed by her orthopedist with a herniated disc on MRI.  They are in the beginning of working this up and planning a management strategy for her sciatica.  She is currently on Robaxin but reports this is not helping and her pain was so severe today that she was unable to walk secondary to pain.  She denies weakness in her leg.  No numbness or tingling.  No bowel or bladder complaints.  Denies abdominal pain.  Symptoms are moderate to severe in severity.   Past Medical History:  Diagnosis Date  . Allergic rhinitis   . Exposure to TB   . HTN (hypertension)   . Hyperlipidemia   . 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 ESSENTIAL HYPERTENSION 12/28/2009  . Allergic rhinitis 12/28/2009  . 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 RENAL INSUFFICIENCY, CHRONIC 12/28/2009    ** The histories are not reviewed yet. Please review them in the "History" navigator section and refresh this [facility].   OB History   No obstetric history on file.      Home Medications    Prior to Admission medications   Medication Sig Start Date End Date Taking? Authorizing Provider  hydrochlorothiazide (MICROZIDE) 12.5 MG capsule Take 12.5 mg by mouth daily.    [provider]  HYDROcodone-acetaminophen (NORCO/VICODIN) 5-325 MG tablet Take 1 tablet by mouth every 4 (four) hours as needed for moderate pain. 11/24/18   Jola Schmidt,  MD  metoprolol tartrate (LOPRESSOR) 50 MG tablet Take 50 mg by mouth 2 (two) times daily.    [provider]  NIFEdipine (PROCARDIA XL/ADALAT-CC) 90 MG 24 hr tablet Take 90 mg by mouth daily.    [provider]  pravastatin (PRAVACHOL) 40 MG tablet Take 40 mg by mouth daily.    [provider]    Family History Family History  Problem Relation Age of Onset  . Hypertension Mother   . Hypertension Father   . Hypertension Sister   . Hypertension Brother   . Asthma Son   . Other Other        no known hx of lung disease or ILD    Social History Social History   Tobacco Use  . Smoking status: Never Smoker  . Smokeless tobacco: Never Used  Substance Use Topics  . Alcohol use: Not on file  . Drug use: Not on file     Allergies   Patient has no known allergies.   Review of Systems Review of Systems  All other systems reviewed and are negative.    Physical Exam Updated Vital Signs BP (!) 159/74   Pulse 79   Temp 98.4 F (36.9 C)   Resp 16   SpO2 98%   Physical Exam Vitals signs and nursing note reviewed.  Constitutional:      Appearance: She is well-developed.  HENT:     Head: Normocephalic.  Neck:     Musculoskeletal: Normal range of motion.  Pulmonary:     Effort: Pulmonary effort is normal.  Abdominal:     General: There is no distension.  Musculoskeletal: Normal range of motion.     Comments: No thoracic or lumbar point tenderness.  Paralumbar tenderness on the left without significant spasm.  Tenderness in the left sciatic groove.  Normal PT and DP pulses bilaterally.  Full range of motion of bilateral hips, knees, ankles.  No swelling of the left lower extremity as compared to the right  Neurological:     Mental Status: She is alert and oriented to person, place, and time.      ED Treatments / Results  Labs (all labs ordered are listed, but only abnormal results are displayed) Labs Reviewed - No data to display  EKG  None  Radiology No results found.  Procedures Procedures (including critical care time)  Medications Ordered in ED Medications  diazepam (VALIUM) tablet 5 mg (5 mg Oral Given 11/24/18 0859)  morphine 4 MG/ML injection 4 mg (4 mg Intravenous Given 11/24/18 0900)  ketorolac (TORADOL) 15 MG/ML injection 15 mg (15 mg Intravenous Given 11/24/18 0859)  oxyCODONE-acetaminophen (PERCOCET/ROXICET) 5-325 MG per tablet 1 tablet (1 tablet Oral Given 11/24/18 1349)     Initial Impression / Assessment and Plan / ED Course  I have reviewed the triage vital signs and the nursing notes.  Pertinent labs & imaging results that were available during my care of the patient were reviewed by me and considered in my medical decision making (see chart for details).        Patient is overall well-appearing.  She has no focal weakness of her lower extremities.  No bowel or bladder complaints.  Nothing to suggest cauda equina at this time.  Her abdominal exam is benign.  This is likely left-sided sciatica from her recently diagnosed herniated disc.  I recommend close follow-up with the orthopedic team.  I will add the addition of hydrocodone at this time to her muscle relaxant.  Close primary care follow-up.  Case was discussed with the patient as well as with the patient's daughter.  Agreeable to outpatient plan.  Patient and family understand to return to the emergency department for new or worsening symptoms  Final Clinical Impressions(s) / ED Diagnoses   Final diagnoses:  Acute sciatica    ED Discharge Orders         Ordered    HYDROcodone-acetaminophen (NORCO/VICODIN) 5-325 MG tablet  Every 4 hours PRN     11/24/18 1334           Jola Schmidt, MD 11/25/18 [phone]

## 2018-11-26 ENCOUNTER — Other Ambulatory Visit: Payer: Self-pay | Admitting: Orthopedic Surgery

## 2018-11-26 DIAGNOSIS — M5416 Radiculopathy, lumbar region: Secondary | ICD-10-CM | POA: Diagnosis not present

## 2018-12-02 ENCOUNTER — Ambulatory Visit (HOSPITAL_COMMUNITY)
Admission: RE | Admit: 2018-12-02 | Discharge: 2018-12-02 | Disposition: A | Discharge status: Home / Self Care | Payer: Medicare Other | Source: Home / Self Care | Attending: Orthopedic Surgery | Admitting: Orthopedic Surgery

## 2018-12-02 ENCOUNTER — Inpatient Hospital Stay (HOSPITAL_COMMUNITY): Payer: Medicare Other

## 2018-12-02 ENCOUNTER — Emergency Department (HOSPITAL_COMMUNITY): Payer: Medicare Other | Admitting: Certified Registered"

## 2018-12-02 ENCOUNTER — Inpatient Hospital Stay (HOSPITAL_COMMUNITY)
Admission: EM | Admit: 2018-12-02 | Discharge: 2018-12-04 | DRG: 454 | Disposition: A | Discharge status: Rehabilitation Facility | Payer: Medicare Other | Attending: Orthopedic Surgery | Admitting: Orthopedic Surgery

## 2018-12-02 ENCOUNTER — Inpatient Hospital Stay (HOSPITAL_COMMUNITY)
Admission: EM | Disposition: A | Discharge status: Home / Self Care | Payer: Self-pay | Source: Home / Self Care | Attending: Orthopedic Surgery

## 2018-12-02 ENCOUNTER — Emergency Department (HOSPITAL_COMMUNITY): Payer: Medicare Other

## 2018-12-02 ENCOUNTER — Encounter (HOSPITAL_COMMUNITY)
Admission: RE | Disposition: A | Discharge status: Home / Self Care | Payer: Self-pay | Source: Home / Self Care | Attending: Orthopedic Surgery

## 2018-12-02 ENCOUNTER — Encounter (HOSPITAL_COMMUNITY): Payer: Self-pay | Admitting: Emergency Medicine

## 2018-12-02 ENCOUNTER — Other Ambulatory Visit: Payer: Self-pay

## 2018-12-02 DIAGNOSIS — M5116 Intervertebral disc disorders with radiculopathy, lumbar region: Secondary | ICD-10-CM | POA: Diagnosis present

## 2018-12-02 DIAGNOSIS — Z7401 Bed confinement status: Secondary | ICD-10-CM

## 2018-12-02 DIAGNOSIS — G8220 Paraplegia, unspecified: Secondary | ICD-10-CM | POA: Diagnosis present

## 2018-12-02 DIAGNOSIS — M545 Low back pain: Secondary | ICD-10-CM | POA: Diagnosis not present

## 2018-12-02 DIAGNOSIS — D62 Acute posthemorrhagic anemia: Secondary | ICD-10-CM | POA: Diagnosis present

## 2018-12-02 DIAGNOSIS — Z4789 Encounter for other orthopedic aftercare: Secondary | ICD-10-CM | POA: Diagnosis present

## 2018-12-02 DIAGNOSIS — M48061 Spinal stenosis, lumbar region without neurogenic claudication: Principal | ICD-10-CM | POA: Diagnosis present

## 2018-12-02 DIAGNOSIS — N319 Neuromuscular dysfunction of bladder, unspecified: Secondary | ICD-10-CM | POA: Diagnosis not present

## 2018-12-02 DIAGNOSIS — M5126 Other intervertebral disc displacement, lumbar region: Secondary | ICD-10-CM

## 2018-12-02 DIAGNOSIS — J309 Allergic rhinitis, unspecified: Secondary | ICD-10-CM | POA: Diagnosis not present

## 2018-12-02 DIAGNOSIS — M4316 Spondylolisthesis, lumbar region: Secondary | ICD-10-CM | POA: Diagnosis not present

## 2018-12-02 DIAGNOSIS — N189 Chronic kidney disease, unspecified: Secondary | ICD-10-CM | POA: Diagnosis not present

## 2018-12-02 DIAGNOSIS — Z1159 Encounter for screening for other viral diseases: Secondary | ICD-10-CM

## 2018-12-02 DIAGNOSIS — Z20828 Contact with and (suspected) exposure to other viral communicable diseases: Secondary | ICD-10-CM | POA: Diagnosis not present

## 2018-12-02 DIAGNOSIS — G834 Cauda equina syndrome: Secondary | ICD-10-CM | POA: Diagnosis not present

## 2018-12-02 DIAGNOSIS — Z201 Contact with and (suspected) exposure to tuberculosis: Secondary | ICD-10-CM | POA: Diagnosis present

## 2018-12-02 DIAGNOSIS — M5410 Radiculopathy, site unspecified: Secondary | ICD-10-CM | POA: Diagnosis present

## 2018-12-02 DIAGNOSIS — K5900 Constipation, unspecified: Secondary | ICD-10-CM | POA: Diagnosis not present

## 2018-12-02 DIAGNOSIS — M21372 Foot drop, left foot: Secondary | ICD-10-CM | POA: Diagnosis present

## 2018-12-02 DIAGNOSIS — R339 Retention of urine, unspecified: Secondary | ICD-10-CM | POA: Diagnosis not present

## 2018-12-02 DIAGNOSIS — M549 Dorsalgia, unspecified: Secondary | ICD-10-CM | POA: Diagnosis not present

## 2018-12-02 DIAGNOSIS — Z419 Encounter for procedure for purposes other than remedying health state, unspecified: Secondary | ICD-10-CM

## 2018-12-02 DIAGNOSIS — M4807 Spinal stenosis, lumbosacral region: Secondary | ICD-10-CM | POA: Diagnosis not present

## 2018-12-02 DIAGNOSIS — Z825 Family history of asthma and other chronic lower respiratory diseases: Secondary | ICD-10-CM | POA: Diagnosis not present

## 2018-12-02 DIAGNOSIS — I10 Essential (primary) hypertension: Secondary | ICD-10-CM | POA: Diagnosis not present

## 2018-12-02 DIAGNOSIS — Z8249 Family history of ischemic heart disease and other diseases of the circulatory system: Secondary | ICD-10-CM | POA: Diagnosis not present

## 2018-12-02 DIAGNOSIS — R21 Rash and other nonspecific skin eruption: Secondary | ICD-10-CM | POA: Diagnosis not present

## 2018-12-02 DIAGNOSIS — R000 Tachycardia, unspecified: Secondary | ICD-10-CM | POA: Diagnosis not present

## 2018-12-02 DIAGNOSIS — E785 Hyperlipidemia, unspecified: Secondary | ICD-10-CM | POA: Diagnosis not present

## 2018-12-02 DIAGNOSIS — R159 Full incontinence of feces: Secondary | ICD-10-CM | POA: Diagnosis not present

## 2018-12-02 DIAGNOSIS — M792 Neuralgia and neuritis, unspecified: Secondary | ICD-10-CM

## 2018-12-02 DIAGNOSIS — N179 Acute kidney failure, unspecified: Secondary | ICD-10-CM | POA: Diagnosis present

## 2018-12-02 DIAGNOSIS — Z981 Arthrodesis status: Secondary | ICD-10-CM | POA: Diagnosis not present

## 2018-12-02 DIAGNOSIS — K592 Neurogenic bowel, not elsewhere classified: Secondary | ICD-10-CM | POA: Diagnosis not present

## 2018-12-02 DIAGNOSIS — J8410 Pulmonary fibrosis, unspecified: Secondary | ICD-10-CM | POA: Diagnosis present

## 2018-12-02 DIAGNOSIS — J849 Interstitial pulmonary disease, unspecified: Secondary | ICD-10-CM | POA: Diagnosis not present

## 2018-12-02 DIAGNOSIS — R262 Difficulty in walking, not elsewhere classified: Secondary | ICD-10-CM | POA: Diagnosis present

## 2018-12-02 DIAGNOSIS — I129 Hypertensive chronic kidney disease with stage 1 through stage 4 chronic kidney disease, or unspecified chronic kidney disease: Secondary | ICD-10-CM | POA: Diagnosis present

## 2018-12-02 DIAGNOSIS — Z79899 Other long term (current) drug therapy: Secondary | ICD-10-CM

## 2018-12-02 DIAGNOSIS — G8918 Other acute postprocedural pain: Secondary | ICD-10-CM | POA: Diagnosis not present

## 2018-12-02 DIAGNOSIS — R7989 Other specified abnormal findings of blood chemistry: Secondary | ICD-10-CM | POA: Diagnosis not present

## 2018-12-02 HISTORY — PX: TRANSFORAMINAL LUMBAR INTERBODY FUSION (TLIF) WITH PEDICLE SCREW FIXATION 1 LEVEL: SHX6141

## 2018-12-02 LAB — SARS CORONAVIRUS 2 BY RT PCR (HOSPITAL ORDER, PERFORMED IN ~~LOC~~ HOSPITAL LAB): SARS Coronavirus 2: NEGATIVE

## 2018-12-02 LAB — BASIC METABOLIC PANEL
Anion gap: 9 (ref 5–15)
BUN: 33 mg/dL — ABNORMAL HIGH (ref 8–23)
CO2: 24 mmol/L (ref 22–32)
Calcium: 9.7 mg/dL (ref 8.9–10.3)
Chloride: 102 mmol/L (ref 98–111)
Creatinine, Ser: 1.61 mg/dL — ABNORMAL HIGH (ref 0.44–1.00)
GFR calc Af Amer: 35 mL/min — ABNORMAL LOW (ref >60)
GFR calc non Af Amer: 30 mL/min — ABNORMAL LOW (ref >60)
Glucose, Bld: 107 mg/dL — ABNORMAL HIGH (ref 70–99)
Potassium: 4.7 mmol/L (ref 3.5–5.1)
Sodium: 135 mmol/L (ref 135–145)

## 2018-12-02 LAB — URINALYSIS, ROUTINE W REFLEX MICROSCOPIC
Bilirubin Urine: NEGATIVE
Glucose, UA: NEGATIVE mg/dL
Hgb urine dipstick: NEGATIVE
Ketones, ur: NEGATIVE mg/dL
Leukocytes,Ua: NEGATIVE
Nitrite: NEGATIVE
Protein, ur: 100 mg/dL — AB
Specific Gravity, Urine: 1.012 (ref 1.005–1.030)
pH: 5 (ref 5.0–8.0)

## 2018-12-02 LAB — CBC WITH DIFFERENTIAL/PLATELET
Abs Immature Granulocytes: 0.04 10*3/uL (ref 0.00–0.07)
Basophils Absolute: 0 10*3/uL (ref 0.0–0.1)
Basophils Relative: 0 %
Eosinophils Absolute: 0.3 10*3/uL (ref 0.0–0.5)
Eosinophils Relative: 3 %
HCT: 33.2 % — ABNORMAL LOW (ref 36.0–46.0)
Hemoglobin: 10.2 g/dL — ABNORMAL LOW (ref 12.0–15.0)
Immature Granulocytes: 0 %
Lymphocytes Relative: 14 %
Lymphs Abs: 1.5 10*3/uL (ref 0.7–4.0)
MCH: 30.4 pg (ref 26.0–34.0)
MCHC: 30.7 g/dL (ref 30.0–36.0)
MCV: 98.8 fL (ref 80.0–100.0)
Monocytes Absolute: 0.6 10*3/uL (ref 0.1–1.0)
Monocytes Relative: 6 %
Neutro Abs: 8.3 10*3/uL — ABNORMAL HIGH (ref 1.7–7.7)
Neutrophils Relative %: 77 %
Platelets: 397 10*3/uL (ref 150–400)
RBC: 3.36 MIL/uL — ABNORMAL LOW (ref 3.87–5.11)
RDW: 12.9 % (ref 11.5–15.5)
WBC: 10.8 10*3/uL — ABNORMAL HIGH (ref 4.0–10.5)
nRBC: 0 % (ref 0.0–0.2)

## 2018-12-02 LAB — TYPE AND SCREEN
ABO/RH(D): O POS
Antibody Screen: NEGATIVE

## 2018-12-02 LAB — ABO/RH: ABO/RH(D): O POS

## 2018-12-02 SURGERY — TRANSFORAMINAL LUMBAR INTERBODY FUSION (TLIF) WITH PEDICLE SCREW FIXATION 1 LEVEL
Anesthesia: General | Laterality: Left

## 2018-12-02 SURGERY — TRANSFORAMINAL LUMBAR INTERBODY FUSION (TLIF) WITH PEDICLE SCREW FIXATION 1 LEVEL
Anesthesia: General | Site: Spine Lumbar | Laterality: Left

## 2018-12-02 MED ORDER — BISACODYL 5 MG PO TBEC: 5.0000 mg | DELAYED_RELEASE_TABLET | Freq: Every day | ORAL | Status: DC | PRN

## 2018-12-02 MED ORDER — LACTATED RINGERS IV SOLN
INTRAVENOUS | Status: DC | PRN
  Administered 2018-12-02 (×2): via INTRAVENOUS

## 2018-12-02 MED ORDER — SODIUM CHLORIDE 0.9 % IV SOLN: 250.0000 mL | INTRAVENOUS | Status: DC

## 2018-12-02 MED ORDER — ONDANSETRON HCL 4 MG PO TABS: 4.0000 mg | ORAL_TABLET | Freq: Four times a day (QID) | ORAL | Status: DC | PRN

## 2018-12-02 MED ORDER — BUPIVACAINE LIPOSOME 1.3 % IJ SUSP
20.0000 mL | Freq: Once | INTRAMUSCULAR | Status: DC
  Filled 2018-12-02: qty 20

## 2018-12-02 MED ORDER — PROMETHAZINE HCL 25 MG/ML IJ SOLN: 6.2500 mg | INTRAMUSCULAR | Status: DC | PRN

## 2018-12-02 MED ORDER — ONDANSETRON HCL 4 MG/2ML IJ SOLN: 4.0000 mg | Freq: Four times a day (QID) | INTRAMUSCULAR | Status: DC | PRN

## 2018-12-02 MED ORDER — ROCURONIUM BROMIDE 10 MG/ML (PF) SYRINGE
PREFILLED_SYRINGE | INTRAVENOUS | Status: DC | PRN
  Administered 2018-12-02: 50 mg via INTRAVENOUS

## 2018-12-02 MED ORDER — SODIUM CHLORIDE 0.9% FLUSH: 3.0000 mL | INTRAVENOUS | Status: DC | PRN

## 2018-12-02 MED ORDER — SODIUM CHLORIDE 0.9 % IV SOLN
INTRAVENOUS | Status: DC | PRN
  Administered 2018-12-02: 18:00:00 50 ug/min via INTRAVENOUS

## 2018-12-02 MED ORDER — NIFEDIPINE ER OSMOTIC RELEASE 90 MG PO TB24
90.0000 mg | ORAL_TABLET | Freq: Every day | ORAL | Status: DC
  Administered 2018-12-03 – 2018-12-04 (×2): 90 mg via ORAL
  Filled 2018-12-02 (×2): qty 1

## 2018-12-02 MED ORDER — SUFENTANIL CITRATE 50 MCG/ML IV SOLN
INTRAVENOUS | Status: DC | PRN
  Administered 2018-12-02: 5 ug via INTRAVENOUS
  Administered 2018-12-02: 20 ug via INTRAVENOUS
  Administered 2018-12-02: 5 ug via INTRAVENOUS

## 2018-12-02 MED ORDER — PROPOFOL 500 MG/50ML IV EMUL
INTRAVENOUS | Status: DC | PRN
  Administered 2018-12-02: 50 ug/kg/min via INTRAVENOUS

## 2018-12-02 MED ORDER — DOCUSATE SODIUM 100 MG PO CAPS
100.0000 mg | ORAL_CAPSULE | Freq: Two times a day (BID) | ORAL | Status: DC
  Administered 2018-12-03 – 2018-12-04 (×3): 100 mg via ORAL
  Filled 2018-12-02 (×3): qty 1

## 2018-12-02 MED ORDER — ZOLPIDEM TARTRATE 5 MG PO TABS: 5.0000 mg | ORAL_TABLET | Freq: Every evening | ORAL | Status: DC | PRN

## 2018-12-02 MED ORDER — ALUM & MAG HYDROXIDE-SIMETH 200-200-20 MG/5ML PO SUSP: 30.0000 mL | Freq: Four times a day (QID) | ORAL | Status: DC | PRN

## 2018-12-02 MED ORDER — LIDOCAINE 2% (20 MG/ML) 5 ML SYRINGE
INTRAMUSCULAR | Status: DC | PRN
  Administered 2018-12-02: 40 mg via INTRAVENOUS

## 2018-12-02 MED ORDER — POTASSIUM CHLORIDE IN NACL 20-0.9 MEQ/L-% IV SOLN: INTRAVENOUS | Status: DC

## 2018-12-02 MED ORDER — PHENYLEPHRINE 40 MCG/ML (10ML) SYRINGE FOR IV PUSH (FOR BLOOD PRESSURE SUPPORT)
PREFILLED_SYRINGE | INTRAVENOUS | Status: DC | PRN
  Administered 2018-12-02: 120 ug via INTRAVENOUS
  Administered 2018-12-02: 80 ug via INTRAVENOUS
  Administered 2018-12-02: 120 ug via INTRAVENOUS
  Administered 2018-12-02: 80 ug via INTRAVENOUS

## 2018-12-02 MED ORDER — THROMBIN (RECOMBINANT) 20000 UNITS EX SOLR
CUTANEOUS | Status: AC
  Filled 2018-12-02: qty 20000

## 2018-12-02 MED ORDER — THROMBIN 20000 UNITS EX KIT
PACK | CUTANEOUS | Status: DC | PRN
  Administered 2018-12-02 (×2): 20 mL via TOPICAL

## 2018-12-02 MED ORDER — PRAVASTATIN SODIUM 40 MG PO TABS
40.0000 mg | ORAL_TABLET | Freq: Every day | ORAL | Status: DC
  Administered 2018-12-02 – 2018-12-03 (×2): 40 mg via ORAL
  Filled 2018-12-02 (×2): qty 1

## 2018-12-02 MED ORDER — ACETAMINOPHEN 325 MG PO TABS
650.0000 mg | ORAL_TABLET | ORAL | Status: DC | PRN
  Administered 2018-12-03 – 2018-12-04 (×2): 650 mg via ORAL
  Filled 2018-12-02 (×2): qty 2

## 2018-12-02 MED ORDER — SENNOSIDES-DOCUSATE SODIUM 8.6-50 MG PO TABS: 1.0000 | ORAL_TABLET | Freq: Every evening | ORAL | Status: DC | PRN

## 2018-12-02 MED ORDER — MORPHINE SULFATE (PF) 2 MG/ML IV SOLN: 1.0000 mg | INTRAVENOUS | Status: DC | PRN

## 2018-12-02 MED ORDER — ONDANSETRON HCL 4 MG/2ML IJ SOLN
INTRAMUSCULAR | Status: DC | PRN
  Administered 2018-12-02: 4 mg via INTRAVENOUS

## 2018-12-02 MED ORDER — METOPROLOL TARTRATE 25 MG PO TABS
50.0000 mg | ORAL_TABLET | Freq: Two times a day (BID) | ORAL | Status: DC
  Administered 2018-12-02 – 2018-12-03 (×2): 50 mg via ORAL
  Filled 2018-12-02 (×3): qty 2

## 2018-12-02 MED ORDER — OXYCODONE-ACETAMINOPHEN 5-325 MG PO TABS
1.0000 | ORAL_TABLET | ORAL | Status: DC | PRN
  Administered 2018-12-02: 2 via ORAL
  Administered 2018-12-03 (×2): 1 via ORAL
  Administered 2018-12-03 (×2): 2 via ORAL
  Filled 2018-12-02 (×2): qty 2
  Filled 2018-12-02 (×3): qty 1
  Filled 2018-12-02: qty 2

## 2018-12-02 MED ORDER — GABAPENTIN 300 MG PO CAPS
300.0000 mg | ORAL_CAPSULE | Freq: Every day | ORAL | Status: DC
  Administered 2018-12-03 – 2018-12-04 (×10): 300 mg via ORAL
  Filled 2018-12-02 (×3): qty 1
  Filled 2018-12-02: qty 3
  Filled 2018-12-02 (×5): qty 1

## 2018-12-02 MED ORDER — ACETAMINOPHEN 10 MG/ML IV SOLN: 1000.0000 mg | Freq: Once | INTRAVENOUS | Status: DC | PRN

## 2018-12-02 MED ORDER — PROPOFOL 10 MG/ML IV BOLUS
INTRAVENOUS | Status: DC | PRN
  Administered 2018-12-02: 100 mg via INTRAVENOUS

## 2018-12-02 MED ORDER — SODIUM CHLORIDE 0.9% FLUSH
3.0000 mL | Freq: Two times a day (BID) | INTRAVENOUS | Status: DC
  Administered 2018-12-03 – 2018-12-04 (×2): 3 mL via INTRAVENOUS

## 2018-12-02 MED ORDER — HYDROMORPHONE HCL 1 MG/ML IJ SOLN: 0.2500 mg | INTRAMUSCULAR | Status: DC | PRN

## 2018-12-02 MED ORDER — METHYLENE BLUE 0.5 % INJ SOLN
INTRAVENOUS | Status: AC
  Filled 2018-12-02: qty 10

## 2018-12-02 MED ORDER — ACETAMINOPHEN 650 MG RE SUPP: 650.0000 mg | RECTAL | Status: DC | PRN

## 2018-12-02 MED ORDER — CEFAZOLIN SODIUM-DEXTROSE 2-4 GM/100ML-% IV SOLN
2.0000 g | Freq: Three times a day (TID) | INTRAVENOUS | Status: AC
  Administered 2018-12-03 (×2): 2 g via INTRAVENOUS
  Filled 2018-12-02: qty 100

## 2018-12-02 MED ORDER — FLEET ENEMA 7-19 GM/118ML RE ENEM: 1.0000 | ENEMA | Freq: Once | RECTAL | Status: DC | PRN

## 2018-12-02 MED ORDER — BUPIVACAINE-EPINEPHRINE (PF) 0.25% -1:200000 IJ SOLN
INTRAMUSCULAR | Status: AC
  Filled 2018-12-02: qty 30

## 2018-12-02 MED ORDER — POVIDONE-IODINE 7.5 % EX SOLN
Freq: Once | CUTANEOUS | Status: DC
  Filled 2018-12-02: qty 118

## 2018-12-02 MED ORDER — MORPHINE SULFATE (PF) 4 MG/ML IV SOLN
4.0000 mg | Freq: Once | INTRAVENOUS | Status: AC
  Administered 2018-12-02: 4 mg via INTRAVENOUS
  Filled 2018-12-02: qty 1

## 2018-12-02 MED ORDER — MIDAZOLAM HCL 5 MG/5ML IJ SOLN
INTRAMUSCULAR | Status: DC | PRN
  Administered 2018-12-02: 2 mg via INTRAVENOUS

## 2018-12-02 MED ORDER — 0.9 % SODIUM CHLORIDE (POUR BTL) OPTIME
TOPICAL | Status: DC | PRN
  Administered 2018-12-02 (×2): 1000 mL

## 2018-12-02 MED ORDER — PHENOL 1.4 % MT LIQD: 1.0000 | OROMUCOSAL | Status: DC | PRN

## 2018-12-02 MED ORDER — DIAZEPAM 5 MG PO TABS
5.0000 mg | ORAL_TABLET | Freq: Four times a day (QID) | ORAL | Status: DC | PRN
  Filled 2018-12-02: qty 1

## 2018-12-02 MED ORDER — BUPIVACAINE LIPOSOME 1.3 % IJ SUSP
INTRAMUSCULAR | Status: DC | PRN
  Administered 2018-12-02: 20 mL

## 2018-12-02 MED ORDER — CEFAZOLIN SODIUM-DEXTROSE 2-4 GM/100ML-% IV SOLN
2.0000 g | INTRAVENOUS | Status: AC
  Administered 2018-12-02: 18:00:00 2 g via INTRAVENOUS

## 2018-12-02 MED ORDER — DEXAMETHASONE SODIUM PHOSPHATE 10 MG/ML IJ SOLN
INTRAMUSCULAR | Status: DC | PRN
  Administered 2018-12-02: 5 mg via INTRAVENOUS

## 2018-12-02 MED ORDER — MENTHOL 3 MG MT LOZG: 1.0000 | LOZENGE | OROMUCOSAL | Status: DC | PRN

## 2018-12-02 MED ORDER — BUPIVACAINE-EPINEPHRINE 0.25% -1:200000 IJ SOLN
INTRAMUSCULAR | Status: DC | PRN
  Administered 2018-12-02: 20 mL

## 2018-12-02 SURGICAL SUPPLY — 100 items
BENZOIN TINCTURE PRP APPL 2/3 (GAUZE/BANDAGES/DRESSINGS) ×3 IMPLANT
BLADE CLIPPER SURG (BLADE) IMPLANT
BONE VIVIGEN FORMABLE 5.4CC (Bone Implant) ×6 IMPLANT
BUR PRESCISION 1.7 ELITE (BURR) ×3 IMPLANT
BUR ROUND FLUTED 5 RND (BURR) ×2 IMPLANT
BUR ROUND FLUTED 5MM RND (BURR) ×1
BUR ROUND PRECISION 4.0 (BURR) IMPLANT
BUR ROUND PRECISION 4.0MM (BURR)
BUR SABER RD CUTTING 3.0 (BURR) IMPLANT
BUR SABER RD CUTTING 3.0MM (BURR)
CAGE CONCORDE BULLET 9X10X27 (Cage) ×2 IMPLANT
CARTRIDGE OIL MAESTRO DRILL (MISCELLANEOUS) ×1 IMPLANT
CLOSURE STERI-STRIP 1/2X4 (GAUZE/BANDAGES/DRESSINGS) ×1
CLOSURE WOUND 1/2 X4 (GAUZE/BANDAGES/DRESSINGS) ×2
CLSR STERI-STRIP ANTIMIC 1/2X4 (GAUZE/BANDAGES/DRESSINGS) ×1 IMPLANT
CONT SPEC 4OZ CLIKSEAL STRL BL (MISCELLANEOUS) ×5 IMPLANT
COVER BACK TABLE 60X90IN (DRAPES) ×3 IMPLANT
COVER MAYO STAND STRL (DRAPES) ×8 IMPLANT
COVER SURGICAL LIGHT HANDLE (MISCELLANEOUS) ×3 IMPLANT
COVER WAND RF STERILE (DRAPES) ×3 IMPLANT
DIFFUSER DRILL AIR PNEUMATIC (MISCELLANEOUS) ×3 IMPLANT
DRAIN CHANNEL 15F RND FF W/TCR (WOUND CARE) ×2 IMPLANT
DRAPE C-ARM 42X72 X-RAY (DRAPES) ×3 IMPLANT
DRAPE C-ARMOR (DRAPES) IMPLANT
DRAPE POUCH INSTRU U-SHP 10X18 (DRAPES) ×3 IMPLANT
DRAPE SURG 17X23 STRL (DRAPES) ×12 IMPLANT
DURAPREP 26ML APPLICATOR (WOUND CARE) ×3 IMPLANT
ELECT BLADE 4.0 EZ CLEAN MEGAD (MISCELLANEOUS) ×3
ELECT CAUTERY BLADE 6.4 (BLADE) ×3 IMPLANT
ELECT REM PT RETURN 9FT ADLT (ELECTROSURGICAL) ×3
ELECTRODE BLDE 4.0 EZ CLN MEGD (MISCELLANEOUS) ×1 IMPLANT
ELECTRODE REM PT RTRN 9FT ADLT (ELECTROSURGICAL) ×1 IMPLANT
EVACUATOR SILICONE 100CC (DRAIN) ×2 IMPLANT
FILTER STRAW FLUID ASPIR (MISCELLANEOUS) ×3 IMPLANT
GAUZE 4X4 16PLY RFD (DISPOSABLE) ×3 IMPLANT
GAUZE SPONGE 4X4 12PLY STRL (GAUZE/BANDAGES/DRESSINGS) ×3 IMPLANT
GAUZE SPONGE 4X4 12PLY STRL LF (GAUZE/BANDAGES/DRESSINGS) ×2 IMPLANT
GLOVE BIO SURGEON STRL SZ7 (GLOVE) ×5 IMPLANT
GLOVE BIO SURGEON STRL SZ8 (GLOVE) ×3 IMPLANT
GLOVE BIOGEL PI IND STRL 7.0 (GLOVE) ×1 IMPLANT
GLOVE BIOGEL PI IND STRL 8 (GLOVE) ×1 IMPLANT
GLOVE BIOGEL PI INDICATOR 7.0 (GLOVE) ×2
GLOVE BIOGEL PI INDICATOR 8 (GLOVE) ×2
GLOVE SURG SS PI 7.0 STRL IVOR (GLOVE) ×2 IMPLANT
GOWN STRL REUS W/ TWL LRG LVL3 (GOWN DISPOSABLE) ×2 IMPLANT
GOWN STRL REUS W/ TWL XL LVL3 (GOWN DISPOSABLE) ×1 IMPLANT
GOWN STRL REUS W/TWL LRG LVL3 (GOWN DISPOSABLE) ×4
GOWN STRL REUS W/TWL XL LVL3 (GOWN DISPOSABLE) ×2
GRAFT BNE MATRIX VG FRMBL MD 5 (Bone Implant) IMPLANT
IV CATH 14GX2 1/4 (CATHETERS) ×3 IMPLANT
KIT BASIN OR (CUSTOM PROCEDURE TRAY) ×3 IMPLANT
KIT NDL NVM5 EMG ELECT (KITS) IMPLANT
KIT NEEDLE NVM5 EMG ELECT (KITS) ×1 IMPLANT
KIT NEEDLE NVM5 EMG ELECTRODE (KITS) ×2
KIT POSITION SURG JACKSON T1 (MISCELLANEOUS) ×3 IMPLANT
KIT TURNOVER KIT B (KITS) ×3 IMPLANT
MARKER SKIN DUAL TIP RULER LAB (MISCELLANEOUS) ×6 IMPLANT
MODULE EMG NDL SSEP NVM5 (NEEDLE) IMPLANT
MODULE EMG NEEDLE SSEP NVM5 (NEEDLE) ×3 IMPLANT
NDL HYPO 25GX1X1/2 BEV (NEEDLE) ×1 IMPLANT
NDL SAFETY ECLIPSE 18X1.5 (NEEDLE) ×1 IMPLANT
NDL SPNL 18GX3.5 QUINCKE PK (NEEDLE) ×2 IMPLANT
NEEDLE 22X1 1/2 (OR ONLY) (NEEDLE) ×6 IMPLANT
NEEDLE HYPO 18GX1.5 SHARP (NEEDLE) ×2
NEEDLE HYPO 25GX1X1/2 BEV (NEEDLE) ×3 IMPLANT
NEEDLE SPNL 18GX3.5 QUINCKE PK (NEEDLE) ×6 IMPLANT
NS IRRIG 1000ML POUR BTL (IV SOLUTION) ×3 IMPLANT
OIL CARTRIDGE MAESTRO DRILL (MISCELLANEOUS) ×3
PACK LAMINECTOMY ORTHO (CUSTOM PROCEDURE TRAY) ×3 IMPLANT
PACK UNIVERSAL I (CUSTOM PROCEDURE TRAY) ×3 IMPLANT
PAD ARMBOARD 7.5X6 YLW CONV (MISCELLANEOUS) ×6 IMPLANT
PATTIES SURGICAL .5 X.5 (GAUZE/BANDAGES/DRESSINGS) ×2 IMPLANT
PATTIES SURGICAL .5 X1 (DISPOSABLE) ×3 IMPLANT
PATTIES SURGICAL .5X1.5 (GAUZE/BANDAGES/DRESSINGS) ×3 IMPLANT
PROBE BALL TIP NVM5 SNG USE (BALLOONS) ×2 IMPLANT
ROD PRE BENT EXP 40MM (Rod) ×2 IMPLANT
ROD PRE LORDOSED 5.5X45 (Rod) ×2 IMPLANT
SCREW SET SINGLE INNER (Screw) ×8 IMPLANT
SCREW VIPER CORT FIX 5.00X30 (Screw) ×2 IMPLANT
SCREW VIPER CORT FIX 6.00X30 (Screw) ×6 IMPLANT
SPONGE INTESTINAL PEANUT (DISPOSABLE) ×3 IMPLANT
SPONGE LAP 4X18 RFD (DISPOSABLE) ×2 IMPLANT
SPONGE SURGIFOAM ABS GEL 100 (HEMOSTASIS) ×3 IMPLANT
STRIP CLOSURE SKIN 1/2X4 (GAUZE/BANDAGES/DRESSINGS) ×4 IMPLANT
SURGIFLO W/THROMBIN 8M KIT (HEMOSTASIS) ×2 IMPLANT
SUT ETHILON 2 0 FS 18 (SUTURE) ×2 IMPLANT
SUT MNCRL AB 4-0 PS2 18 (SUTURE) ×3 IMPLANT
SUT VIC AB 0 CT1 18XCR BRD 8 (SUTURE) ×1 IMPLANT
SUT VIC AB 0 CT1 8-18 (SUTURE) ×2
SUT VIC AB 1 CT1 18XCR BRD 8 (SUTURE) ×1 IMPLANT
SUT VIC AB 1 CT1 8-18 (SUTURE) ×2
SUT VIC AB 2-0 CT2 18 VCP726D (SUTURE) ×5 IMPLANT
SYR 20CC LL (SYRINGE) ×8 IMPLANT
SYR BULB IRRIGATION 50ML (SYRINGE) ×3 IMPLANT
SYR CONTROL 10ML LL (SYRINGE) ×6 IMPLANT
SYR TB 1ML LUER SLIP (SYRINGE) ×3 IMPLANT
TAPE CLOTH SURG 6X10 WHT LF (GAUZE/BANDAGES/DRESSINGS) ×2 IMPLANT
TRAY FOLEY MTR SLVR 16FR STAT (SET/KITS/TRAYS/PACK) ×3 IMPLANT
WATER STERILE IRR 1000ML POUR (IV SOLUTION) ×3 IMPLANT
YANKAUER SUCT BULB TIP NO VENT (SUCTIONS) ×3 IMPLANT

## 2018-12-02 NOTE — Anesthesia Preprocedure Evaluation (Signed)
Anesthesia Evaluation  Patient identified by MRN, date of birth, ID band Patient awake    Reviewed: Allergy & Precautions, NPO status , Patient's Chart, lab work & pertinent test results  Airway Mallampati: II  TM Distance: >3 FB Neck ROM: Full    Dental no notable dental hx.    Pulmonary neg pulmonary ROS,    Pulmonary exam normal breath sounds clear to auscultation       Cardiovascular hypertension, Pt. on medications and Pt. on home beta blockers Normal cardiovascular exam Rhythm:Regular Rate:Normal     Neuro/Psych secondary to loss of bowel and bladder control and severe increase in L leg pain and BIL LE weakness. She was taken to the ED and an updated MRI was obtained demonstrating substantial increase in the size of her disc herniation and complete occlusion of the spinal canal. negative neurological ROS  negative psych ROS   GI/Hepatic negative GI ROS, Neg liver ROS,   Endo/Other  negative endocrine ROS  Renal/GU Renal InsufficiencyRenal disease  negative genitourinary   Musculoskeletal negative musculoskeletal ROS (+)   Abdominal   Peds negative pediatric ROS (+)  Hematology negative hematology ROS (+)   Anesthesia Other Findings   Reproductive/Obstetrics negative OB ROS                             Anesthesia Physical Anesthesia Plan  ASA: III  Anesthesia Plan: General   Post-op Pain Management:    Induction: Intravenous  PONV Risk Score and Plan: 3 and Ondansetron, Dexamethasone and Treatment may vary due to age or medical condition  Airway Management Planned: Oral ETT  Additional Equipment:   Intra-op Plan:   Post-operative Plan: Extubation in OR  Informed Consent: I have reviewed the patients History and Physical, chart, labs and discussed the procedure including the risks, benefits and alternatives for the proposed anesthesia with the patient or authorized  representative who has indicated his/her understanding and acceptance.     Dental advisory given  Plan Discussed with: CRNA and Surgeon  Anesthesia Plan Comments:         Anesthesia Quick Evaluation

## 2018-12-02 NOTE — ED Triage Notes (Signed)
Pt arrives to ED from home with complaints of a herniated disc that she is having surgery on today. Pt states that her MD told her to come to the ED for her surgery.

## 2018-12-02 NOTE — Anesthesia Procedure Notes (Signed)
Procedure Name: Intubation Date/Time: 12/02/2018 5:17 PM Performed by: Elayne Snare, CRNA Pre-anesthesia Checklist: Patient identified, Emergency Drugs available, Suction available and Patient being monitored Patient Re-evaluated:Patient Re-evaluated prior to induction Oxygen Delivery Method: Circle System Utilized Preoxygenation: Pre-oxygenation with 100% oxygen Induction Type: IV induction Ventilation: Mask ventilation without difficulty Laryngoscope Size: Mac and 3 Grade View: Grade I Tube type: Oral Tube size: 6.5 mm Number of attempts: 2 Airway Equipment and Method: Stylet and Oral airway Placement Confirmation: ETT inserted through vocal cords under direct vision,  positive ETCO2 and breath sounds checked- equal and bilateral Secured at: 19 cm Tube secured with: Tape Dental Injury: Teeth and Oropharynx as per pre-operative assessment  Comments: DL x1 with MAC 3, grade I view, resistance felt when attempting to advance oral 7.5 ETT through cords; DL x2 with MAC 3 grade 1 view - able to easily advance oral 6.5 ETT

## 2018-12-02 NOTE — Op Note (Signed)
PATIENT NAME: Paula Hartman  MEDICAL RECORD NO.:  [id number]  PHYSICIAN: Phylliss Bob, MD DATE OF BIRTH: [birth year]  DATE OF PROCEDURE: 12/02/2018   OPERATIVE REPORT   PREOPERATIVE DIAGNOSES:  1. Very large disc hernation, L3-4  2. Cauda equina syndrome, which developed this morning  3. 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 Very large disc hernation, L3-4 2. Cauda equina syndrome, which developed this morning 3. 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 3.Right-sided L3-4 posterolateral fusion. 4. Insertion of interbody device x 1 (Concorde intervertebral spacer). 5. Placement of posterior instrumentation L3, L4, bilaterally. 6. Use of local autograft. 7. Use of morselized allograft - ViviGen. 8. Intraoperative use of fluoroscopy.  SURGEON: Phylliss Bob, MD.  ASSISTANTPricilla Holm, PA-C.  ANESTHESIA: General endotracheal anesthesia.  COMPLICATIONS: None.  DISPOSITION: Stable.  ESTIMATED BLOOD LOSS:200cc  INDICATIONS FOR SURGERY: Briefly, Paula Hartman is a pleasant 78 y.o. female  who I did previously evaluate in the office.  At the time of my initial evaluation, her symptoms were minimal, however, she was noted to have a large herniated disc.  She had no bowel or bladder dysfunction, but was educated on the signs and symptoms associated with cauda equina syndrome, specifically, bowel or bladder dysfunction.  I then received a phone call this morning and spoke with her daughter, who did inform me that she was incontinent to stool overnight.  I did feel that she did likely develop cauda equina syndrome, and did recommend that she present to the emergency department.  She did present to the emergency department, at which time an updated  emergent MRI of her lumbar spine was obtained, notable for a disc herniation larger than the one noted in my office.  Her herniation was extremely large, occupying the entire spinal canal.  Given her occluded spinal canal and symptoms consistent with cauda equina syndrome, I did recommend emergent surgery.  She did elect to proceed.  Of note, I did express to the patient and her daughter that she may have residual neurologic dysfunction, which may include persistent leg weakness or pain, or persistent bowel or bladder dysfunction.   OPERATIVE DETAILS: On 12/02/2018, the patient was brought to surgery and general endotracheal anesthesia was administered. The patient was placed prone on a well-padded flat Jackson bed with a spinal frame. Antibiotics were given and a time-out procedure was performed. The back was prepped and draped in the usual fashion. A midline incision was made overlying the L3-4 intervertebral space. The fascia was incisedatthe midline. The paraspinal musculature was bluntly swept laterally. Anatomic landmarks for the pedicles were exposed. Using fluoroscopy, I did cannulate the L3 and L4 pedicles bilaterally, using a medial to lateral cortical trajectory technique. However, the right L3 pedicle was noted to be extremely small.  I did use a straight/lateral to medial trajectory, liberally using fluoroscopy.  I then cannulated all pedicle holes and proceeded with the decompression. At L3-4, I did perform a laminotomy and a full facetectomy on the left, and a near full facetectomy on the right.  On the left side, I was able to gently mobilize the traversing left L4 nerve.  2 very large herniated disc fragments were identified in the spinal canal ventral to the dura, which were removed uneventfully.  I then performed an annulotomy.  Additional compressive disc fragments were noted, and were removed.  This did fully decompress the spinal canal. At this point, with an  assistant  holding medial retraction of the traversing left L4 nerve, I did perform a thorough and complete L3-4 intervertebral discectomy.  The intervertebral space was then liberally packed with autograft from the decompression, as well as allograft in the form of ViviGen, as was the appropriately sized intervertebral spacer (10 mm, parallel).  At this point, a 5 mm screw was placed on the right at L3, and 6 mm screws were placed on the left at L3, and bilaterally at L4.  A 40 mm rod was placed on the right, and a 45 mm rod was placed on the left.  Caps were then placed. All 4 caps were then locked. The wound was copiously irrigated with a total of approximately 3 L prior to placing the bone graft. Additional autograft and allograft were then packed into the posterolateral gutter on the right sideto help aid in the success of the fusion. The wound was  explored for any undue bleeding and there was no substantial bleeding encountered. Gel-Foam was placed over the laminectomy site. A #15 deep Blake drain was placed.  The wound was then closed in layers using #1 Vicryl followed by 2-0 Vicryl, followed by 4-0 Monocryl. Benzoin and Steri-Strips were applied followed by sterile dressing.    Of note, I did use triggered EMG to test the screws on the left, and there is no screw the tested below 15 mA. There was no sustained abnormal EMG activity noted throughout the entire surgery.  Of note, Pricilla Holm was my assistant throughout surgery, and did aid in retraction, suctioning, and closure.    Phylliss Bob, MD

## 2018-12-02 NOTE — Progress Notes (Signed)
Okay to cancel pre-op coags per Pricilla Holm PA.
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
## 2018-12-02 NOTE — Progress Notes (Signed)
Orthopedic Tech Progress Note Patient Details:  Paula Hartman 02/07/1941 [id number]  Patient ID: [name], female   DOB: 26-Jul-1940, 78 y.o.   MRN: [id number] pacu called said pt came out of surgery with brace on.  Karolee Stamps 12/02/2018, 11:15 PM

## 2018-12-02 NOTE — ED Notes (Signed)
Pt transferred to Short Stay room 36. Care handoff to RN on short stay.
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
## 2018-12-02 NOTE — Anesthesia Postprocedure Evaluation (Signed)
Anesthesia Post Note  Patient: Paula Hartman  Procedure(s) Performed: LEFT LUMBAR 3-4 TRANSFORAMINAL LUMBAR INTERBODY FUSION (TLIF) WITH INSTRUMENTATION AND ALLOGRAFT (Left Spine Lumbar)     Patient location during evaluation: PACU Anesthesia Type: General Level of consciousness: sedated Pain management: pain level controlled Vital Signs Assessment: post-procedure vital signs reviewed and stable Respiratory status: spontaneous breathing and respiratory function stable Cardiovascular status: stable Postop Assessment: no apparent nausea or vomiting Anesthetic complications: no    Last Vitals:  Vitals:   12/02/18 2220 12/02/18 2248  BP: (!) 164/69 (!) 178/82  Pulse: 89 98  Resp: (!) 22 16  Temp: 36.6 C 37.2 C  SpO2: 97% 100%    Last Pain:  Vitals:   12/02/18 2248  TempSrc: Oral  PainSc:                  Rithwik Schmieg DANIEL

## 2018-12-02 NOTE — Progress Notes (Signed)
Of note, I did see this patient in my office, at which point she was diagnosed with a large herniated disc.  Her symptoms at the time of my initial evaluation with her were minimal.  She then presented to the emergency department with increased pain, but neurologically, was intact, with no alteration in bowel or bladder function.  She was set up elective surgery with me next week. I did very thoroughly educate her and her daughter the symptoms associated with cauda equina syndrome.  Her daughter then contacted the answering service at my office, at which point I did speak with her at approximately 6:15 AM today.  Her daughter informed me that the patient was incontinent to bowel, and was having trouble urinating.  I did feel that it was extremely likely that the patient did develop a cauda equina syndrome, and I did instruct the patient's daughter to bring her directly to the emergency department for an emergent MRI, and likely emergent surgery.  I did review her updated lumbar MRI.  Her previously identified disc herniation has gotten larger, and is entirely occluding the spinal canal.  It is clear at this point that she has developed cauda equina syndrome.  She is currently n.p.o., and will be brought to surgery later today.  A full consult note will follow.Marland KitchenMarland Kitchen

## 2018-12-02 NOTE — Transfer of Care (Signed)
Immediate Anesthesia Transfer of Care Note  Patient: Paula Hartman  Procedure(s) Performed: LEFT LUMBAR 3-4 TRANSFORAMINAL LUMBAR INTERBODY FUSION (TLIF) WITH INSTRUMENTATION AND ALLOGRAFT (Left Spine Lumbar)  Patient Location: PACU  Anesthesia Type:General  Level of Consciousness: drowsy, patient cooperative and responds to stimulation  Airway & Oxygen Therapy: Patient Spontanous Breathing and Patient connected to nasal cannula oxygen  Post-op Assessment: Report given to RN and Post -op Vital signs reviewed and stable  Post vital signs: Reviewed and stable  Last Vitals:  Vitals Value Taken Time  BP 165/58 12/02/18 2144  Temp    Pulse 94 12/02/18 2149  Resp 15 12/02/18 2149  SpO2 100 % 12/02/18 2149  Vitals shown include unvalidated device data.  Last Pain:  Vitals:   12/02/18 1316  TempSrc:   PainSc: 3       Patients Stated Pain Goal: 0 (43/01/48 4039)  Complications: No apparent anesthesia complications

## 2018-12-02 NOTE — OR Nursing (Signed)
Daughter, Reita May, called on cellphone per surgeon request @ [phone]

## 2018-12-02 NOTE — ED Notes (Signed)
Bladder scan volume is 844ml

## 2018-12-03 ENCOUNTER — Encounter (HOSPITAL_COMMUNITY): Payer: Self-pay | Admitting: Orthopedic Surgery

## 2018-12-03 MED ORDER — SODIUM CHLORIDE (PF) 0.9 % IJ SOLN
INTRAMUSCULAR | Status: AC
  Filled 2018-12-03: qty 10

## 2018-12-03 MED ORDER — PROPOFOL 10 MG/ML IV BOLUS
INTRAVENOUS | Status: AC
  Filled 2018-12-03: qty 20

## 2018-12-03 MED ORDER — DIAZEPAM 5 MG PO TABS: 5.0000 mg | ORAL_TABLET | Freq: Four times a day (QID) | ORAL | 0 refills | Status: DC | PRN

## 2018-12-03 MED ORDER — SUFENTANIL CITRATE 50 MCG/ML IV SOLN
INTRAVENOUS | Status: AC
  Filled 2018-12-03: qty 1

## 2018-12-03 MED ORDER — OXYCODONE-ACETAMINOPHEN 5-325 MG PO TABS: 1.0000 | ORAL_TABLET | ORAL | 0 refills | Status: DC | PRN

## 2018-12-03 MED ORDER — MIDAZOLAM HCL 2 MG/2ML IJ SOLN
INTRAMUSCULAR | Status: AC
  Filled 2018-12-03: qty 2

## 2018-12-03 MED ORDER — LIDOCAINE 2% (20 MG/ML) 5 ML SYRINGE
INTRAMUSCULAR | Status: AC
  Filled 2018-12-03: qty 5

## 2018-12-03 MED FILL — Thrombin (Recombinant) For Soln 20000 Unit: CUTANEOUS | Qty: 1 | Status: AC

## 2018-12-03 NOTE — Progress Notes (Signed)
    Patient doing well  Has been resting since surgery Patient reports resolved leg pain Still incontinent to stool this AM   Physical Exam: Vitals:   12/03/18 0057 12/03/18 0456  BP: (!) 131/47 (!) 147/54  Pulse: 65 (!) 58  Resp: 18 16  Temp:  98.2 F (36.8 C)  SpO2: 94% 96%   Patient appears comfortable Dressing in place Patient now has 4/5 strength to KE and 3/5 EHL, and 2/5 DF (improved)  Drain output 60 since surgery  POD #1 s/p decompression and fusion for cauda equina syndrome with resolved leg pain and improved strength  - up with PT/OT, encourage ambulation - Percocet for pain, Valium for muscle spasms - bladder scan if not void by 11am, and foley will be placed if UOP > 400 - patient may require rehab and AFO

## 2018-12-03 NOTE — PMR Pre-admission (Signed)
PMR Admission Coordinator Pre-Admission Assessment  Patient: Marcella Charlson is an 78 y.o., female MRN: [id number] DOB: 19-Jan-1941 Height: 4' 7" (139.7 cm) Weight: 54 kg  Insurance Information HMO:     PPO:      PCP:      IPA:      80/20: Yes     OTHER:  PRIMARY: Medicare Part A and B      Policy#: [id number]      Subscriber: Patient CM Name:       Phone#:      Fax#:  Pre-Cert#:       Employer:  Benefits:  Phone #: NA     Name: verified eligibility online via [place] on 12/03/2018 Eff. Date: Part A effective 06/27/2004; Part B effective: 06/27/2004     Deduct: $1,408      Out of Pocket Max: NA      Life Max: NA CIR: Covered per Medicare guidelines once yearly deductible is met.      SNF: days 1-20, 100%, days 21-100, 80%.  Outpatient: 80%     Co-Pay: 20% Home Health: 100%      Co-Pay: 0% DME: 80%     Co-Pay: 20% Providers: Pt's choice SECONDARY: Tricare      Policy#: [id number]      Subscriber: Patient CM Name:       Phone#:      Fax#:  Pre-Cert#:       Employer:  Benefits:  Phone #: [phone]     Name:  Eff. Date:      Deduct:       Out of Pocket Max:       Life Max:  CIR:       SNF:  Outpatient:      Co-Pay:  Home Health:       Co-Pay:  DME:      Co-Pay:   Medicaid Application Date:       Case Manager:  Disability Application Date:       Case Worker:   The "Data Collection Information Summary" for patients in Inpatient Rehabilitation Facilities with attached "Privacy Act [place] Records" was provided and verbally reviewed with: Patient  Emergency Contact Information Contact Information    Name Relation Home Work Kennard Daughter [phone]  [phone]      Current Medical History  Patient Admitting Diagnosis: cauda equina syndrome s/p decompression and fusion   History of Present Illness: Pt is a 35 you female with history of severe spinal stenosis and L radiculopathy symptomology. Pt has initially been scheduled for L3-4 TLIF one  week out but due to acute onset of loss of bowel and bladder control and increased LLE pain and BLE weakness, pt was emergently taken to ED. MRI showed a substantial increase in the size of her disc herniation and complete occlusion of the spinal cord. Pt was taken emergently for surgery due to cauda equina syndrome. Pt underwent L3-4 decompression and fusion by Dr. Lynann Bologna. Pt was evaluated by therapies with recommendation for CIR due to continued increased assistance for ambulation and ADLs. Pt is to be admitted to CIR on 12/04/2018.    Patient's medical record from [facility] has been reviewed by the rehabilitation admission coordinator and physician.  Past Medical History  Past Medical History:  Diagnosis Date  . Allergic rhinitis   . Exposure to TB   . HTN (hypertension)   . Hyperlipidemia   . 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  Prior Rehab/Hospitalizations Has the patient had prior rehab or hospitalizations prior to admission? No  Has the patient had major surgery during 100 days prior to admission? Yes   Current Medications  Current Facility-Administered Medications:  .  0.9 %  sodium chloride infusion, 250 mL, Intravenous, Continuous, McKenzie, Kayla J, PA-C .  0.9 % NaCl with KCl 20 mEq/ L  infusion, , Intravenous, Continuous, McKenzie, Kayla J, PA-C .  acetaminophen (TYLENOL) tablet 650 mg, 650 mg, Oral, Q4H PRN, 650 mg at 12/04/18 0450 **OR** acetaminophen (TYLENOL) suppository 650 mg, 650 mg, Rectal, Q4H PRN, McKenzie, Kayla J, PA-C .  alum & mag hydroxide-simeth (MAALOX/MYLANTA) 200-200-20 MG/5ML suspension 30 mL, 30 mL, Oral, Q6H PRN, McKenzie, Kayla J, PA-C .  bisacodyl (DULCOLAX) EC tablet 5 mg, 5 mg, Oral, Daily PRN,  McKenzie, Kayla J, PA-C .  diazepam (VALIUM) tablet 5 mg, 5 mg, Oral, Q6H PRN, McKenzie, Kayla J, PA-C .  docusate sodium (COLACE) capsule 100 mg, 100 mg, Oral, BID, McKenzie, Kayla J, PA-C, 100 mg at 12/04/18 0836 .  gabapentin (NEURONTIN) capsule 300 mg, 300 mg, Oral, 6 X Daily, McKenzie, Kayla J, PA-C, 300 mg at 12/04/18 1126 .  menthol-cetylpyridinium (CEPACOL) lozenge 3 mg, 1 lozenge, Oral, PRN **OR** phenol (CHLORASEPTIC) mouth spray 1 spray, 1 spray, Mouth/Throat, PRN, McKenzie, Kayla J, PA-C .  metoprolol tartrate (LOPRESSOR) tablet 50 mg, 50 mg, Oral, BID, McKenzie, Kayla J, PA-C, 50 mg at 12/03/18 0937 .  morphine 2 MG/ML injection 1-2 mg, 1-2 mg, Intravenous, Q2H PRN, McKenzie, Kayla J, PA-C .  NIFEdipine (PROCARDIA XL/NIFEDICAL-XL) 24 hr tablet 90 mg, 90 mg, Oral, Daily, McKenzie, Kayla J, PA-C, 90 mg at 12/04/18 0836 .  ondansetron (ZOFRAN) tablet 4 mg, 4 mg, Oral, Q6H PRN **OR** ondansetron (ZOFRAN) injection 4 mg, 4 mg, Intravenous, Q6H PRN, McKenzie, Kayla J, PA-C .  oxyCODONE-acetaminophen (PERCOCET/ROXICET) 5-325 MG per tablet 1-2 tablet, 1-2 tablet, Oral, Q4H PRN, McKenzie, Kayla J, PA-C, 1 tablet at 12/03/18 2107 .  pravastatin (PRAVACHOL) tablet 40 mg, 40 mg, Oral, QHS, McKenzie, Lennie Muckle, PA-C, 40 mg at 12/03/18 2107 .  senna-docusate (Senokot-S) tablet 1 tablet, 1 tablet, Oral, QHS PRN, McKenzie, Kayla J, PA-C .  sodium chloride flush (NS) 0.9 % injection 3 mL, 3 mL, Intravenous, Q12H, McKenzie, Kayla J, PA-C, 3 mL at 12/04/18 0842 .  sodium chloride flush (NS) 0.9 % injection 3 mL, 3 mL, Intravenous, PRN, McKenzie, Kayla J, PA-C .  sodium phosphate (FLEET) 7-19 GM/118ML enema 1 enema, 1 enema, Rectal, Once PRN, McKenzie, Kayla J, PA-C .  zolpidem (AMBIEN) tablet 5 mg, 5 mg, Oral, QHS PRN, McKenzie, Kayla J, PA-C  Patients Current Diet:  Diet Order            Diet regular Room service appropriate? No; Fluid consistency: Thin  Diet effective now               Precautions / Restrictions Precautions Precautions: Back, Fall Precaution Booklet Issued: Yes (comment) Precaution Comments: began instruction in back precautions Spinal Brace: Thoracolumbosacral orthotic, Applied in sitting position Restrictions Weight Bearing Restrictions: No   Has the patient had 2 or more falls or a fall with injury in the past year? No  Prior Activity Level Community (5-7x/wk):  very active prior to [place]; Independent in ADLs, Mod I with ambulation (cane use for community ambulation); drove PTA and went to [facility] for water aerobics   Prior Functional Level Self Care: Did the patient need help bathing, dressing, using the toilet or eating? Independent  Indoor Mobility: Did the patient need assistance with walking from room to room (with or without device)? Independent  Stairs: Did the patient need assistance with internal or external stairs (with or without device)? Independent  Functional Cognition: Did the patient need help planning regular tasks such as shopping or remembering to take medications? Independent  Home Assistive Devices / Equipment Home Assistive Devices/Equipment: Dentures (specify type), Eyeglasses, Walker (specify type) Home Equipment: None  Prior Device Use: Indicate devices/aids used by the patient prior to current illness, exacerbation or injury? single point cane for community ambulation; no AD while in house; does have access to RW at home if needed  Current Functional Level Cognition  Overall Cognitive Status: Within Functional Limits for tasks assessed Orientation Level: Oriented X4    Extremity Assessment (includes Sensation/Coordination)  Upper Extremity Assessment: Overall WFL for tasks assessed  Lower Extremity Assessment: Generalized weakness(LLE strength and sensitation weaker than RLE. gross 3+/5)    ADLs  Overall ADL's : Needs assistance/impaired Eating/Feeding: Independent, Bed level Grooming: Set up, Sitting Upper  Body Bathing: Minimal assistance, Sitting Lower Body Bathing: Total assistance, +2 for physical assistance, Sit to/from stand Upper Body Dressing : Minimal assistance, Sitting, Maximal assistance Upper Body Dressing Details (indicate cue type and reason): max for brace Lower Body Dressing: Total assistance, Sit to/from stand, +2 for physical assistance Toilet Transfer: Minimal assistance, +2 for physical assistance, BSC, RW Toileting- Clothing Manipulation and Hygiene: Total assistance, +2 for physical assistance, Sit to/from stand Functional mobility during ADLs: (can take steps along EOB with walker and 2 person min assist)    Mobility  Overal bed mobility: Needs Assistance Bed Mobility: Rolling, Sidelying to Sit Rolling: Min assist Sidelying to sit: Min assist General bed mobility comments: instructed in log roll technique, heavy reliance on rail, assist for LEs    Transfers  Overall transfer level: Needs assistance Equipment used: Rolling walker (2 wheeled) Transfers: Sit to/from Stand, Stand Pivot Transfers Sit to Stand: +2 physical assistance, Min assist Stand pivot transfers: +2 physical assistance, Min assist General transfer comment: assist to rise and steady, pt unable to feel her L LE    Ambulation / Gait / Stairs / Wheelchair Mobility  Ambulation/Gait General Gait Details: unable at this time     Posture / Balance Balance Overall balance assessment: Needs assistance Sitting balance-Leahy Scale: Fair Standing balance support: Bilateral upper extremity supported Standing balance-Leahy Scale: Poor    Special needs/care consideration BiPAP/CPAP : no CPM : no Continuous Drip IV : no Dialysis : no       Days : no Life Vest : no Oxygen : on RA Special Bed : no Trach Size : no Wound Vac (area) : no      Location : no Skin : surgical incision to back                Bowel mgmt: incontinent from cauda equina syndrome; last BM: 12/03/2018 Bladder mgmt: catheter  Diabetic  mgmt: no Behavioral consideration : no Chemo/radiation : no   Previous Home Environment (from acute therapy documentation) Living Arrangements: Children Available Help at Discharge: Family, Available 24 hours/day(daughter) Type of Home: House Home Layout: Two level, Full bath on main level(can stay in son in  laws office on first floor) Alternate Level Stairs-Number of Steps: flight Home Access: Stairs to enter Entrance Stairs-Rails: Right Entrance Stairs-Number of Steps: 3 Bathroom Shower/Tub: Chiropodist: Standard Home Care Services: No Additional Comments: reports MD was going to order RW, 3 in 1 and w/c prior to her scheduled back sx  Discharge Living Setting Plans for Discharge Living Setting: Patient's home, Lives with (comment)(daughter and son in law) Type of Home at Discharge: House Discharge Home Layout: Two level Alternate Level Stairs-Rails: Right Alternate Level Stairs-Number of Steps: 12 Discharge Home Access: Level entry Discharge Bathroom Shower/Tub: Tub/shower unit Discharge Bathroom Toilet: Standard Discharge Bathroom Accessibility: Yes How Accessible: Accessible via walker Does the patient have any problems obtaining your medications?: No  Social/Family/Support Systems Patient Roles: Other (Comment)(lives with family ) Contact Information: daughter: Addison Bailey cell: [phone]; home: [phone] Anticipated Caregiver: daugther  Anticipated Caregiver's Contact Information: see above Ability/Limitations of Caregiver: Min A Caregiver Availability: 24/7 Discharge Plan Discussed with Primary Caregiver: Yes Is Caregiver In Agreement with Plan?: Yes Does Caregiver/Family have Issues with Lodging/Transportation while Pt is in Rehab?: No  Goals/Additional Needs Patient/Family Goal for Rehab: PT/OT: Supervision; SLP: NA Expected length of stay: 7-10 days Cultural Considerations: Catholic  Dietary Needs: regular diet, thin  lqiuids Equipment Needs: TBD Special Service Needs: SCI team Pt/Family Agrees to Admission and willing to participate: Yes Program Orientation Provided & Reviewed with Pt/Caregiver Including Roles  & Responsibilities: Yes(pt and daughter Denman George)  Barriers to Discharge: Home environment access/layout  Barriers to Discharge Comments: bedroom 2nd floor; family able to get bed downstairs but would prefer her to be able to navigate stairs (has full bath on 1st level as needed)  Decrease burden of Care through IP rehab admission: NA  Possible need for SNF placement upon discharge: Not anticipated; pt has 24/7 A at home from her daughter who is willing and able to assist as needed.   Patient Condition: I have reviewed medical records from [facility], spoken with RN, and patient and daughter. I met with patient at the bedside for inpatient rehabilitation assessment.  Patient will benefit from ongoing PT and OT, can actively participate in 3 hours of therapy a day 5 days of the week, and can make measurable gains during the admission.  Patient will also benefit from the coordinated team approach during an Inpatient Acute Rehabilitation admission.  The patient will receive intensive therapy as well as Rehabilitation physician, nursing, social worker, and care management interventions.  Due to bladder management, bowel management, safety, skin/wound care, disease management, medication administration, pain management and patient education the patient requires 24 hour a day rehabilitation nursing.  The patient is currently Min A x2 for transfers (some side steps along bed) and Min A to Total A x 2 for basic ADLs.  Discharge setting and therapy post discharge at home with home health is anticipated.  Patient has agreed to participate in the Acute Inpatient Rehabilitation Program and will admit 12/04/2018.  Preadmission Screen Completed By:  Jhonnie Garner, 12/04/2018 2:37  PM ______________________________________________________________________   Discussed status with Dr. Posey Pronto on 12/04/2018 at 11:58AM and received approval for admission today. cauda equina syndrome s/p decompression and fusion   Admission Coordinator:  Jhonnie Garner, OT, time 11:58AM/Date 12/04/2018   Assessment/Plan: Diagnosis: Cauda equina status post decompression  1. Does the need for close, 24 hr/day Medical supervision in concert with the patient's rehab needs make it unreasonable for this patient to be served in a less intensive setting?  Potentially 2. Co-Morbidities requiring supervision/potential complications: CKD, HTN, HLD 3. Due to bladder management, bowel management, safety, skin/wound care, disease management, pain management and patient education, does the patient require 24 hr/day rehab nursing? Yes 4. Does the patient require coordinated care of a physician, rehab nurse, PT (1-2 hrs/day, 5 days/week) and OT (1-2 hrs/day, 5 days/week) to address physical and functional deficits in the context of the above medical diagnosis(es)? Potentially Addressing deficits in the following areas: balance, endurance, locomotion, strength, transferring, bathing, dressing, toileting and psychosocial support 5. Can the patient actively participate in an intensive therapy program of at least 3 hrs of therapy 5 days a week? Yes 6. The potential for patient to make measurable gains while on inpatient rehab is excellent 7. Anticipated functional outcomes upon discharge from inpatients are: modified independent and supervision PT, modified independent and supervision OT, n/a SLP 8. Estimated rehab length of stay to reach the above functional goals is: 18-23 days. 9. Anticipated D/C setting: Home 10. Anticipated post D/C treatments: HH therapy and Home excercise program 11. Overall Rehab/Functional Prognosis: excellent and good  MD Signature: Delice Lesch, MD, ABPMR

## 2018-12-03 NOTE — Evaluation (Signed)
Occupational Therapy Evaluation Patient Details Name: Paula Hartman MRN: [id number] DOB: 01/15/1941 Today's Date: 12/03/2018    History of Present Illness Pt is a 78 year old woman admitted for emergent decompression and fusion L3-4 on 12/02/18. Pt was scheduled for a TLIF next week, but experienced loss of bowel and bladder control, severe L LE pain and B LE weakness due to cauda equina syndrome. PMH: HTN, renal disease.   Clinical Impression   Pt is typically independent. Presents with LE parasthesia and weakness requiring RW and 2 person assist for OOB activity and up to total assist for ADL. Pt is an excellent candidate for inpatient rehab, recommending CIR. Will follow.   Follow Up Recommendations  CIR    Equipment Recommendations  3 in 1 bedside commode;Tub/shower bench;Wheelchair (measurements OT);Wheelchair cushion (measurements OT)    Recommendations for Other Services Rehab consult     Precautions / Restrictions Precautions Precautions: Back;Fall Precaution Booklet Issued: Yes (comment) Precaution Comments: began instruction in back precautions Required Braces or Orthoses: Spinal Brace Spinal Brace: Thoracolumbosacral orthotic;Applied in sitting position      Mobility Bed Mobility Overal bed mobility: Needs Assistance Bed Mobility: Rolling;Sidelying to Sit Rolling: Min assist Sidelying to sit: Min assist       General bed mobility comments: instructed in log roll technique, heavy reliance on rail, assist for LEs  Transfers Overall transfer level: Needs assistance Equipment used: Rolling walker (2 wheeled) Transfers: Sit to/from [facility] Sit to Stand: +2 physical assistance;Min assist Stand pivot transfers: +2 physical assistance;Min assist       General transfer comment: assist to rise and steady, pt unable to feel her L LE    Balance Overall balance assessment: Needs assistance   Sitting balance-Leahy Scale: Fair     Standing  balance support: Bilateral upper extremity supported Standing balance-Leahy Scale: Poor                             ADL either performed or assessed with clinical judgement   ADL Overall ADL's : Needs assistance/impaired Eating/Feeding: Independent;Bed level   Grooming: Set up;Sitting   Upper Body Bathing: Minimal assistance;Sitting   Lower Body Bathing: Total assistance;+2 for physical assistance;Sit to/from stand   Upper Body Dressing : Minimal assistance;Sitting;Maximal assistance Upper Body Dressing Details (indicate cue type and reason): max for brace Lower Body Dressing: Total assistance;Sit to/from stand;+2 for physical assistance   Toilet Transfer: Minimal assistance;+2 for physical assistance;BSC;RW   Toileting- Clothing Manipulation and Hygiene: Total assistance;+2 for physical assistance;Sit to/from stand       Functional mobility during ADLs: (can take steps along EOB with walker and 2 person min assist)       Vision Baseline Vision/History: Wears glasses Wears Glasses: Reading only Patient Visual Report: No change from baseline       Perception     Praxis      Pertinent Vitals/Pain Pain Assessment: No/denies pain     Hand Dominance Right   Extremity/Trunk Assessment Upper Extremity Assessment Upper Extremity Assessment: Overall WFL for tasks assessed   Lower Extremity Assessment Lower Extremity Assessment: Defer to PT evaluation       Communication Communication Communication: Prefers language other than English   Cognition Arousal/Alertness: Awake/alert Behavior During Therapy: Anxious Overall Cognitive Status: Within Functional Limits for tasks assessed  General Comments       Exercises     Shoulder Instructions      Home Living Family/patient expects to be discharged to:: Private residence Living Arrangements: Children Available Help at Discharge: Family;Available 24  hours/day(daughter) Type of Home: House Home Access: Stairs to enter [employer] of Steps: 3 Entrance Stairs-Rails: Right Home Layout: Two level;Full bath on main level(can stay in son in laws office on first floor) Alternate Level Stairs-Number of Steps: flight   Bathroom Shower/Tub: Teacher, early years/pre: Standard     Home Equipment: None   Additional Comments: reports MD was going to order RW, 3 in 1 and w/c prior to her scheduled back sx      Prior Functioning/Environment Level of Independence: Independent                 OT Problem List: Decreased strength;Decreased activity tolerance;Impaired balance (sitting and/or standing);Decreased coordination;Decreased knowledge of use of DME or AE;Decreased knowledge of precautions      OT Treatment/Interventions: Self-care/ADL training;DME and/or AE instruction;Therapeutic activities;Patient/family education;Balance training    OT Goals(Current goals can be found in the care plan section) Acute Rehab OT Goals Patient Stated Goal: regain use of legs OT Goal Formulation: With patient Time For Goal Achievement: 12/17/18 Potential to Achieve Goals: Good ADL Goals Pt Will Perform Lower Body Bathing: with modified independence;sitting/lateral leans Pt Will Perform Upper Body Dressing: with set-up;sitting(including back brace) Pt Will Perform Lower Body Dressing: with modified independence;sitting/lateral leans;with adaptive equipment Pt Will Transfer to Toilet: with supervision;stand pivot transfer;bedside commode Pt Will Perform Toileting - Clothing Manipulation and hygiene: with modified independence;sitting/lateral leans Pt Will Perform Tub/Shower Transfer: with supervision;Stand pivot transfer;tub bench;rolling walker;Tub transfer  OT Frequency: Min 3X/week   Barriers to D/C:            Co-evaluation PT/OT/SLP Co-Evaluation/Treatment: Yes Reason for Co-Treatment: For patient/therapist safety    OT goals addressed during session: ADL's and self-care      AM-PAC OT "6 Clicks" Daily Activity     Outcome Measure Help from another person eating meals?: None Help from another person taking care of personal grooming?: A Little Help from another person toileting, which includes using toliet, bedpan, or urinal?: Total Help from another person bathing (including washing, rinsing, drying)?: A Lot Help from another person to put on and taking off regular upper body clothing?: A Lot Help from another person to put on and taking off regular lower body clothing?: Total 6 Click Score: 13   End of Session Equipment Utilized During Treatment: Gait belt;Rolling walker;Back brace Nurse Communication: Mobility status  Activity Tolerance: Patient tolerated treatment well Patient left: in chair;with call bell/phone within reach  OT Visit Diagnosis: Unsteadiness on feet (R26.81);Other abnormalities of gait and mobility (R26.89)                Time: 3785-8850 OT Time Calculation (min): 26 min Charges:  OT General Charges $OT Visit: 1 Visit OT Evaluation $OT Eval Moderate Complexity: 1 Mod  Nestor Lewandowsky, OTR/L Acute Rehabilitation Services Pager: [phone] Office: [phone]  Malka So 12/03/2018, 9:55 AM

## 2018-12-03 NOTE — Evaluation (Addendum)
Physical Therapy Evaluation Patient Details Name: Paula Hartman MRN: [id number] DOB: Jun 07, 1940 Today's Date: 12/03/2018   History of Present Illness  Pt is a 78 year old woman admitted for emergent decompression and fusion L3-4 on 12/02/18. Pt was scheduled for a TLIF next week, but experienced loss of bowel and bladder control, severe L LE pain and B LE weakness due to cauda equina syndrome. PMH: HTN, renal disease.    Clinical Impression  Patient is s/p above surgery resulting in functional limitations due to the deficits listed below (see PT Problem List). PTA pt independent at home living with family in 2 story house. Today, patient with significant weakness and instability with OOB mobility, able to tranfers to [facility] with min A of 2. Pt eager and agreeable for post acute therapy to regain independence Patient will benefit from skilled PT to increase their independence and safety with mobility to allow discharge to the venue listed below.       Follow Up Recommendations CIR    Equipment Recommendations  RW, 3in1, TBD   Recommendations for Other Services       Precautions / Restrictions Precautions Precautions: Back;Fall Precaution Booklet Issued: Yes (comment) Precaution Comments: began instruction in back precautions Required Braces or Orthoses: Spinal Brace Spinal Brace: Thoracolumbosacral orthotic;Applied in sitting position      Mobility  Bed Mobility Overal bed mobility: Needs Assistance Bed Mobility: Rolling;Sidelying to Sit Rolling: Min assist Sidelying to sit: Min assist       General bed mobility comments: instructed in log roll technique, heavy reliance on rail, assist for LEs  Transfers Overall transfer level: Needs assistance Equipment used: Rolling walker (2 wheeled) Transfers: Sit to/from [facility] Sit to Stand: +2 physical assistance;Min assist Stand pivot transfers: +2 physical assistance;Min assist       General transfer  comment: assist to rise and steady, pt unable to feel her L LE  Ambulation/Gait             General Gait Details: unable at this time   Stairs            Wheelchair Mobility    Modified Rankin (Stroke Patients Only)       Balance Overall balance assessment: Needs assistance   Sitting balance-Leahy Scale: Fair     Standing balance support: Bilateral upper extremity supported Standing balance-Leahy Scale: Poor                               Pertinent Vitals/Pain Pain Assessment: No/denies pain    Home Living Family/patient expects to be discharged to:: Private residence Living Arrangements: Children Available Help at Discharge: Family;Available 24 hours/day(daughter) Type of Home: House Home Access: Stairs to enter Entrance Stairs-Rails: Right Entrance Stairs-Number of Steps: 3 Home Layout: Two level;Full bath on main level(can stay in son in laws office on first floor) Home Equipment: None Additional Comments: reports MD was going to order RW, 3 in 1 and w/c prior to her scheduled back sx    Prior Function Level of Independence: Independent               Hand Dominance   Dominant Hand: Right    Extremity/Trunk Assessment   Upper Extremity Assessment Upper Extremity Assessment: Overall WFL for tasks assessed    Lower Extremity Assessment Lower Extremity Assessment: Generalized weakness(LLE strength and sensitation weaker than RLE. gross 3+/5)       Communication   Communication: Prefers language  other than [place]  Cognition Arousal/Alertness: Awake/alert Behavior During Therapy: Anxious Overall Cognitive Status: Within Functional Limits for tasks assessed                                        General Comments      Exercises     Assessment/Plan    PT Assessment Patient needs continued PT services  PT Problem List Decreased strength       PT Treatment Interventions DME instruction;Gait  training;Stair training;Therapeutic activities;Functional mobility training;Therapeutic exercise    PT Goals (Current goals can be found in the Care Plan section)  Acute Rehab PT Goals Patient Stated Goal: regain use of legs PT Goal Formulation: With patient Time For Goal Achievement: 12/17/18 Potential to Achieve Goals: Good    Frequency Min 3X/week   Barriers to discharge        Co-evaluation PT/OT/SLP Co-Evaluation/Treatment: Yes Reason for Co-Treatment: For patient/therapist safety;To address functional/ADL transfers PT goals addressed during session: Mobility/safety with mobility;Balance;Proper use of DME;Strengthening/ROM OT goals addressed during session: ADL's and self-care       AM-PAC PT "6 Clicks" Mobility  Outcome Measure Help needed turning from your back to your side while in a flat bed without using bedrails?: A Little Help needed moving from lying on your back to sitting on the side of a flat bed without using bedrails?: A Lot Help needed moving to and from a bed to a chair (including a wheelchair)?: A Lot Help needed standing up from a chair using your arms (e.g., wheelchair or bedside chair)?: A Lot Help needed to walk in hospital room?: A Lot Help needed climbing 3-5 steps with a railing? : Total 6 Click Score: 12    End of Session Equipment Utilized During Treatment: Gait belt Activity Tolerance: Patient tolerated treatment well Patient left: in bed;with call bell/phone within reach Nurse Communication: Mobility status PT Visit Diagnosis: Unsteadiness on feet (R26.81)    Time: 0349-1791 PT Time Calculation (min) (ACUTE ONLY): 29 min   Charges:   PT Evaluation $PT Eval Moderate Complexity: 1 Mod          Reinaldo Berber, PT, DPT Acute Rehabilitation Services Pager: [phone] Office: [phone]    Reinaldo Berber 12/03/2018, 10:14 AM

## 2018-12-03 NOTE — Progress Notes (Signed)
Inpatient Rehab Admissions:  Inpatient Rehab Consult received.  I met with pt at the bedside for rehabilitation assessment and to discuss goals and expectations of an inpatient rehab admission.  Pt very interested in the program. With permission, [facility] spoke with pt's daughter, Denman George, to confirm caregiver support and interest. Support confirmed but Denman George would like to speak with the rest of her family prior to making final decision. AC will follow up tomorrow for final decision and will pursue possible admission if pt/family in agreement.   Jhonnie Garner, OTR/L  Rehab Admissions Coordinator  [phone] 12/03/2018 12:56 PM

## 2018-12-04 ENCOUNTER — Other Ambulatory Visit: Payer: Self-pay

## 2018-12-04 ENCOUNTER — Inpatient Hospital Stay (HOSPITAL_COMMUNITY)
Admission: RE | Admit: 2018-12-04 | Discharge: 2018-12-22 | DRG: 560 | Disposition: A | Discharge status: Home / Self Care | Payer: Medicare Other | Source: Intra-hospital | Attending: Physical Medicine & Rehabilitation | Admitting: Physical Medicine & Rehabilitation

## 2018-12-04 ENCOUNTER — Inpatient Hospital Stay (HOSPITAL_COMMUNITY): Payer: Medicare Other

## 2018-12-04 ENCOUNTER — Encounter (HOSPITAL_COMMUNITY): Payer: Self-pay

## 2018-12-04 DIAGNOSIS — R7989 Other specified abnormal findings of blood chemistry: Secondary | ICD-10-CM | POA: Diagnosis not present

## 2018-12-04 DIAGNOSIS — D62 Acute posthemorrhagic anemia: Secondary | ICD-10-CM | POA: Diagnosis present

## 2018-12-04 DIAGNOSIS — E785 Hyperlipidemia, unspecified: Secondary | ICD-10-CM | POA: Diagnosis present

## 2018-12-04 DIAGNOSIS — R21 Rash and other nonspecific skin eruption: Secondary | ICD-10-CM | POA: Diagnosis not present

## 2018-12-04 DIAGNOSIS — G834 Cauda equina syndrome: Secondary | ICD-10-CM

## 2018-12-04 DIAGNOSIS — M48061 Spinal stenosis, lumbar region without neurogenic claudication: Secondary | ICD-10-CM | POA: Diagnosis present

## 2018-12-04 DIAGNOSIS — R000 Tachycardia, unspecified: Secondary | ICD-10-CM | POA: Diagnosis not present

## 2018-12-04 DIAGNOSIS — N179 Acute kidney failure, unspecified: Secondary | ICD-10-CM | POA: Diagnosis present

## 2018-12-04 DIAGNOSIS — R339 Retention of urine, unspecified: Secondary | ICD-10-CM

## 2018-12-04 DIAGNOSIS — Z825 Family history of asthma and other chronic lower respiratory diseases: Secondary | ICD-10-CM

## 2018-12-04 DIAGNOSIS — I129 Hypertensive chronic kidney disease with stage 1 through stage 4 chronic kidney disease, or unspecified chronic kidney disease: Secondary | ICD-10-CM | POA: Diagnosis present

## 2018-12-04 DIAGNOSIS — M792 Neuralgia and neuritis, unspecified: Secondary | ICD-10-CM | POA: Diagnosis present

## 2018-12-04 DIAGNOSIS — M21372 Foot drop, left foot: Secondary | ICD-10-CM | POA: Diagnosis present

## 2018-12-04 DIAGNOSIS — R159 Full incontinence of feces: Secondary | ICD-10-CM

## 2018-12-04 DIAGNOSIS — G8918 Other acute postprocedural pain: Secondary | ICD-10-CM

## 2018-12-04 DIAGNOSIS — N189 Chronic kidney disease, unspecified: Secondary | ICD-10-CM | POA: Diagnosis present

## 2018-12-04 DIAGNOSIS — R262 Difficulty in walking, not elsewhere classified: Secondary | ICD-10-CM | POA: Diagnosis present

## 2018-12-04 DIAGNOSIS — Z4789 Encounter for other orthopedic aftercare: Principal | ICD-10-CM

## 2018-12-04 DIAGNOSIS — G8220 Paraplegia, unspecified: Secondary | ICD-10-CM | POA: Diagnosis present

## 2018-12-04 DIAGNOSIS — I10 Essential (primary) hypertension: Secondary | ICD-10-CM

## 2018-12-04 DIAGNOSIS — N319 Neuromuscular dysfunction of bladder, unspecified: Secondary | ICD-10-CM | POA: Diagnosis not present

## 2018-12-04 DIAGNOSIS — M5410 Radiculopathy, site unspecified: Secondary | ICD-10-CM | POA: Diagnosis present

## 2018-12-04 DIAGNOSIS — M5126 Other intervertebral disc displacement, lumbar region: Secondary | ICD-10-CM

## 2018-12-04 DIAGNOSIS — R0989 Other specified symptoms and signs involving the circulatory and respiratory systems: Secondary | ICD-10-CM

## 2018-12-04 DIAGNOSIS — M4210 Adult osteochondrosis of spine, site unspecified: Secondary | ICD-10-CM | POA: Diagnosis present

## 2018-12-04 DIAGNOSIS — Z8249 Family history of ischemic heart disease and other diseases of the circulatory system: Secondary | ICD-10-CM | POA: Diagnosis not present

## 2018-12-04 DIAGNOSIS — K5900 Constipation, unspecified: Secondary | ICD-10-CM

## 2018-12-04 DIAGNOSIS — K592 Neurogenic bowel, not elsewhere classified: Secondary | ICD-10-CM | POA: Diagnosis not present

## 2018-12-04 LAB — COMPREHENSIVE METABOLIC PANEL
ALT: 18 U/L (ref 0–44)
AST: 73 U/L — ABNORMAL HIGH (ref 15–41)
Albumin: 2.9 g/dL — ABNORMAL LOW (ref 3.5–5.0)
Alkaline Phosphatase: 85 U/L (ref 38–126)
Anion gap: 10 (ref 5–15)
BUN: 41 mg/dL — ABNORMAL HIGH (ref 8–23)
CO2: 24 mmol/L (ref 22–32)
Calcium: 8.6 mg/dL — ABNORMAL LOW (ref 8.9–10.3)
Chloride: 102 mmol/L (ref 98–111)
Creatinine, Ser: 2.25 mg/dL — ABNORMAL HIGH (ref 0.44–1.00)
GFR calc Af Amer: 23 mL/min — ABNORMAL LOW (ref >60)
GFR calc non Af Amer: 20 mL/min — ABNORMAL LOW (ref >60)
Glucose, Bld: 120 mg/dL — ABNORMAL HIGH (ref 70–99)
Potassium: 5.3 mmol/L — ABNORMAL HIGH (ref 3.5–5.1)
Sodium: 136 mmol/L (ref 135–145)
Total Bilirubin: 0.5 mg/dL (ref 0.3–1.2)
Total Protein: 6.2 g/dL — ABNORMAL LOW (ref 6.5–8.1)

## 2018-12-04 LAB — CBC WITH DIFFERENTIAL/PLATELET
Abs Immature Granulocytes: 0.04 10*3/uL (ref 0.00–0.07)
Basophils Absolute: 0 10*3/uL (ref 0.0–0.1)
Basophils Relative: 0 %
Eosinophils Absolute: 0.3 10*3/uL (ref 0.0–0.5)
Eosinophils Relative: 3 %
HCT: 22.2 % — ABNORMAL LOW (ref 36.0–46.0)
Hemoglobin: 7 g/dL — ABNORMAL LOW (ref 12.0–15.0)
Immature Granulocytes: 1 %
Lymphocytes Relative: 11 %
Lymphs Abs: 0.9 10*3/uL (ref 0.7–4.0)
MCH: 30.8 pg (ref 26.0–34.0)
MCHC: 31.5 g/dL (ref 30.0–36.0)
MCV: 97.8 fL (ref 80.0–100.0)
Monocytes Absolute: 0.6 10*3/uL (ref 0.1–1.0)
Monocytes Relative: 7 %
Neutro Abs: 6.1 10*3/uL (ref 1.7–7.7)
Neutrophils Relative %: 78 %
Platelets: 276 10*3/uL (ref 150–400)
RBC: 2.27 MIL/uL — ABNORMAL LOW (ref 3.87–5.11)
RDW: 13.1 % (ref 11.5–15.5)
WBC: 7.9 10*3/uL (ref 4.0–10.5)
nRBC: 0 % (ref 0.0–0.2)

## 2018-12-04 LAB — OCCULT BLOOD X 1 CARD TO LAB, STOOL: Fecal Occult Bld: NEGATIVE

## 2018-12-04 MED ORDER — OXYCODONE-ACETAMINOPHEN 5-325 MG PO TABS
1.0000 | ORAL_TABLET | ORAL | Status: DC | PRN
  Administered 2018-12-06 (×2): 1 via ORAL
  Filled 2018-12-04 (×2): qty 1

## 2018-12-04 MED ORDER — DIPHENHYDRAMINE HCL 12.5 MG/5ML PO ELIX: 12.5000 mg | ORAL_SOLUTION | Freq: Four times a day (QID) | ORAL | Status: DC | PRN

## 2018-12-04 MED ORDER — SENNA 8.6 MG PO TABS
2.0000 | ORAL_TABLET | Freq: Every day | ORAL | Status: DC
  Administered 2018-12-05 – 2018-12-14 (×8): 17.2 mg via ORAL
  Filled 2018-12-04 (×13): qty 2

## 2018-12-04 MED ORDER — SODIUM CHLORIDE 0.9 % IV SOLN
INTRAVENOUS | Status: DC
  Administered 2018-12-04 – 2018-12-05 (×2): via INTRAVENOUS

## 2018-12-04 MED ORDER — PRAVASTATIN SODIUM 40 MG PO TABS
40.0000 mg | ORAL_TABLET | Freq: Every day | ORAL | Status: DC
  Administered 2018-12-04 – 2018-12-21 (×18): 40 mg via ORAL
  Filled 2018-12-04 (×18): qty 1

## 2018-12-04 MED ORDER — GUAIFENESIN-DM 100-10 MG/5ML PO SYRP: 5.0000 mL | ORAL_SOLUTION | Freq: Four times a day (QID) | ORAL | Status: DC | PRN

## 2018-12-04 MED ORDER — FLEET ENEMA 7-19 GM/118ML RE ENEM: 1.0000 | ENEMA | Freq: Once | RECTAL | Status: DC | PRN

## 2018-12-04 MED ORDER — BISACODYL 10 MG RE SUPP: 10.0000 mg | Freq: Every day | RECTAL | Status: DC | PRN

## 2018-12-04 MED ORDER — PHENOL 1.4 % MT LIQD: 1.0000 | OROMUCOSAL | Status: DC | PRN

## 2018-12-04 MED ORDER — PROCHLORPERAZINE EDISYLATE 10 MG/2ML IJ SOLN: 5.0000 mg | Freq: Four times a day (QID) | INTRAMUSCULAR | Status: DC | PRN

## 2018-12-04 MED ORDER — POLYETHYLENE GLYCOL 3350 17 G PO PACK
17.0000 g | PACK | Freq: Every day | ORAL | Status: DC | PRN
  Administered 2018-12-05: 17 g via ORAL
  Filled 2018-12-04: qty 1

## 2018-12-04 MED ORDER — GABAPENTIN 300 MG PO CAPS
300.0000 mg | ORAL_CAPSULE | Freq: Every day | ORAL | Status: DC
  Administered 2018-12-04: 300 mg via ORAL
  Filled 2018-12-04: qty 1

## 2018-12-04 MED ORDER — PROCHLORPERAZINE 25 MG RE SUPP
12.5000 mg | Freq: Four times a day (QID) | RECTAL | Status: DC | PRN
  Filled 2018-12-04: qty 1

## 2018-12-04 MED ORDER — ACETAMINOPHEN 325 MG PO TABS
325.0000 mg | ORAL_TABLET | ORAL | Status: DC | PRN
  Administered 2018-12-05 – 2018-12-08 (×5): 650 mg via ORAL
  Filled 2018-12-04 (×5): qty 2

## 2018-12-04 MED ORDER — ALUM & MAG HYDROXIDE-SIMETH 200-200-20 MG/5ML PO SUSP: 30.0000 mL | ORAL | Status: DC | PRN

## 2018-12-04 MED ORDER — ZOLPIDEM TARTRATE 5 MG PO TABS: 5.0000 mg | ORAL_TABLET | Freq: Every evening | ORAL | Status: DC | PRN

## 2018-12-04 MED ORDER — METOPROLOL TARTRATE 50 MG PO TABS
50.0000 mg | ORAL_TABLET | Freq: Two times a day (BID) | ORAL | Status: DC
  Administered 2018-12-04 – 2018-12-22 (×35): 50 mg via ORAL
  Filled 2018-12-04 (×36): qty 1

## 2018-12-04 MED ORDER — NIFEDIPINE ER OSMOTIC RELEASE 90 MG PO TB24
90.0000 mg | ORAL_TABLET | Freq: Every day | ORAL | Status: DC
  Administered 2018-12-05 – 2018-12-22 (×18): 90 mg via ORAL
  Filled 2018-12-04 (×18): qty 1

## 2018-12-04 MED ORDER — ONDANSETRON HCL 4 MG PO TABS: 4.0000 mg | ORAL_TABLET | Freq: Four times a day (QID) | ORAL | Status: DC | PRN

## 2018-12-04 MED ORDER — MENTHOL 3 MG MT LOZG
1.0000 | LOZENGE | OROMUCOSAL | Status: DC | PRN
  Filled 2018-12-04: qty 9

## 2018-12-04 MED ORDER — BISACODYL 10 MG RE SUPP
10.0000 mg | Freq: Every day | RECTAL | Status: DC
  Administered 2018-12-06 – 2018-12-14 (×6): 10 mg via RECTAL
  Filled 2018-12-04 (×14): qty 1

## 2018-12-04 MED ORDER — PROCHLORPERAZINE MALEATE 5 MG PO TABS: 5.0000 mg | ORAL_TABLET | Freq: Four times a day (QID) | ORAL | Status: DC | PRN

## 2018-12-04 MED ORDER — ONDANSETRON HCL 4 MG/2ML IJ SOLN: 4.0000 mg | Freq: Four times a day (QID) | INTRAMUSCULAR | Status: DC | PRN

## 2018-12-04 MED ORDER — GABAPENTIN 300 MG PO CAPS
300.0000 mg | ORAL_CAPSULE | Freq: Every day | ORAL | Status: DC
  Administered 2018-12-04 – 2018-12-05 (×2): 300 mg via ORAL
  Filled 2018-12-04 (×2): qty 1

## 2018-12-04 MED ORDER — SENNOSIDES-DOCUSATE SODIUM 8.6-50 MG PO TABS: 1.0000 | ORAL_TABLET | Freq: Every evening | ORAL | Status: DC | PRN

## 2018-12-04 MED ORDER — TRAZODONE HCL 50 MG PO TABS: 25.0000 mg | ORAL_TABLET | Freq: Every evening | ORAL | Status: DC | PRN
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
## 2018-12-04 NOTE — Progress Notes (Signed)
Jamse Arn, MD  Physician  Physical Medicine and Rehabilitation  PMR Pre-admission  Signed  Date of Service:  12/03/2018 1:08 PM      Related encounter: ED to Hosp-Admission (Discharged) from 12/02/2018 in [place]         PMR Admission Coordinator Pre-Admission Assessment  Patient: Paula Hartman is an 78 y.o., female MRN: [id number] DOB: 1940/11/13 Height: '4\' 7"'  (139.7 cm) Weight: 54 kg  Insurance Information HMO:     PPO:      PCP:      IPA:      80/20: Yes     OTHER:  PRIMARY: Medicare Part A and B      Policy#: [id number]      Subscriber: Patient CM Name:       Phone#:      Fax#:  Pre-Cert#:       Employer:  Benefits:  Phone #: NA     Name: verified eligibility online via [place] on 12/03/2018 Eff. Date: Part A effective 06/27/2004; Part B effective: 06/27/2004     Deduct: $1,408      Out of Pocket Max: NA      Life Max: NA CIR: Covered per Medicare guidelines once yearly deductible is met.      SNF: days 1-20, 100%, days 21-100, 80%.  Outpatient: 80%     Co-Pay: 20% Home Health: 100%      Co-Pay: 0% DME: 80%     Co-Pay: 20% Providers: Pt's choice SECONDARY: Tricare      Policy#: [id number]      Subscriber: Patient CM Name:       Phone#:      Fax#:  Pre-Cert#:       Employer:  Benefits:  Phone #: [phone]     Name:  Eff. Date:      Deduct:       Out of Pocket Max:       Life Max:  CIR:       SNF:  Outpatient:      Co-Pay:  Home Health:       Co-Pay:  DME:      Co-Pay:   Medicaid Application Date:       Case Manager:  Disability Application Date:       Case Worker:   The "Data Collection Information Summary" for patients in Inpatient Rehabilitation Facilities with attached "Privacy Act [place] Records" was provided and verbally reviewed with: Patient  Emergency Contact Information         Contact Information    Name Relation Home Work Paula Hartman Daughter  [phone]  [phone]      Current Medical History  Patient Admitting Diagnosis: cauda equina syndrome s/p decompression and fusion   History of Present Illness: Pt is a 60 you female with history of severe spinal stenosis and L radiculopathy symptomology. Pt has initially been scheduled for L3-4 TLIF one week out but due to acute onset of loss of bowel and bladder control and increased LLE pain and BLE weakness, pt was emergently taken to ED. MRI showed a substantial increase in the size of her disc herniation and complete occlusion of the spinal cord. Pt was taken emergently for surgery due to cauda equina syndrome. Pt underwent L3-4 decompression and fusion by Dr. Lynann Bologna. Pt was evaluated by therapies with recommendation for CIR due to continued increased assistance for ambulation and ADLs. Pt is to be  admitted to CIR on 12/04/2018.    Patient's medical record from [facility] has been reviewed by the rehabilitation admission coordinator and physician.  Past Medical History      Past Medical History:  Diagnosis Date  . Allergic rhinitis   . Exposure to TB   . HTN (hypertension)   . Hyperlipidemia   . 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  Prior Rehab/Hospitalizations Has the patient had prior rehab or hospitalizations prior to admission? No  Has the patient had major surgery during 100 days prior to admission? Yes             Current Medications  Current Facility-Administered Medications:  .  0.9 %  sodium chloride infusion, 250 mL, Intravenous, Continuous, McKenzie, Kayla J, PA-C .  0.9 % NaCl with KCl 20 mEq/ L  infusion, , Intravenous, Continuous, McKenzie, Kayla J, PA-C .  acetaminophen (TYLENOL) tablet  650 mg, 650 mg, Oral, Q4H PRN, 650 mg at 12/04/18 0450 **OR** acetaminophen (TYLENOL) suppository 650 mg, 650 mg, Rectal, Q4H PRN, McKenzie, Kayla J, PA-C .  alum & mag hydroxide-simeth (MAALOX/MYLANTA) 200-200-20 MG/5ML suspension 30 mL, 30 mL, Oral, Q6H PRN, McKenzie, Kayla J, PA-C .  bisacodyl (DULCOLAX) EC tablet 5 mg, 5 mg, Oral, Daily PRN, McKenzie, Kayla J, PA-C .  diazepam (VALIUM) tablet 5 mg, 5 mg, Oral, Q6H PRN, McKenzie, Kayla J, PA-C .  docusate sodium (COLACE) capsule 100 mg, 100 mg, Oral, BID, McKenzie, Kayla J, PA-C, 100 mg at 12/04/18 0836 .  gabapentin (NEURONTIN) capsule 300 mg, 300 mg, Oral, 6 X Daily, McKenzie, Kayla J, PA-C, 300 mg at 12/04/18 1126 .  menthol-cetylpyridinium (CEPACOL) lozenge 3 mg, 1 lozenge, Oral, PRN **OR** phenol (CHLORASEPTIC) mouth spray 1 spray, 1 spray, Mouth/Throat, PRN, McKenzie, Kayla J, PA-C .  metoprolol tartrate (LOPRESSOR) tablet 50 mg, 50 mg, Oral, BID, McKenzie, Kayla J, PA-C, 50 mg at 12/03/18 0937 .  morphine 2 MG/ML injection 1-2 mg, 1-2 mg, Intravenous, Q2H PRN, McKenzie, Kayla J, PA-C .  NIFEdipine (PROCARDIA XL/NIFEDICAL-XL) 24 hr tablet 90 mg, 90 mg, Oral, Daily, McKenzie, Kayla J, PA-C, 90 mg at 12/04/18 0836 .  ondansetron (ZOFRAN) tablet 4 mg, 4 mg, Oral, Q6H PRN **OR** ondansetron (ZOFRAN) injection 4 mg, 4 mg, Intravenous, Q6H PRN, McKenzie, Kayla J, PA-C .  oxyCODONE-acetaminophen (PERCOCET/ROXICET) 5-325 MG per tablet 1-2 tablet, 1-2 tablet, Oral, Q4H PRN, McKenzie, Kayla J, PA-C, 1 tablet at 12/03/18 2107 .  pravastatin (PRAVACHOL) tablet 40 mg, 40 mg, Oral, QHS, McKenzie, Lennie Muckle, PA-C, 40 mg at 12/03/18 2107 .  senna-docusate (Senokot-S) tablet 1 tablet, 1 tablet, Oral, QHS PRN, McKenzie, Kayla J, PA-C .  sodium chloride flush (NS) 0.9 % injection 3 mL, 3 mL, Intravenous, Q12H, McKenzie, Kayla J, PA-C, 3 mL at 12/04/18 0842 .  sodium chloride flush (NS) 0.9 % injection 3 mL, 3 mL, Intravenous, PRN, McKenzie, Kayla J, PA-C .   sodium phosphate (FLEET) 7-19 GM/118ML enema 1 enema, 1 enema, Rectal, Once PRN, McKenzie, Kayla J, PA-C .  zolpidem (AMBIEN) tablet 5 mg, 5 mg, Oral, QHS PRN, McKenzie, Lennie Muckle, PA-C  Patients Current Diet:     Diet Order  Diet regular Room service appropriate? No; Fluid consistency: Thin  Diet effective now               Precautions / Restrictions Precautions Precautions: Back, Fall Precaution Booklet Issued: Yes (comment) Precaution Comments: began instruction in back precautions Spinal Brace: Thoracolumbosacral orthotic, Applied in sitting position Restrictions Weight Bearing Restrictions: No   Has the patient had 2 or more falls or a fall with injury in the past year? No  Prior Activity Level Community (5-7x/wk): very active prior to COVID; Independent in ADLs, Mod I with ambulation (cane use for community ambulation); drove PTA and went to [facility] for water aerobics   Prior Functional Level Self Care: Did the patient need help bathing, dressing, using the toilet or eating? Independent  Indoor Mobility: Did the patient need assistance with walking from room to room (with or without device)? Independent  Stairs: Did the patient need assistance with internal or external stairs (with or without device)? Independent  Functional Cognition: Did the patient need help planning regular tasks such as shopping or remembering to take medications? Independent  Home Assistive Devices / Equipment Home Assistive Devices/Equipment: Dentures (specify type), Eyeglasses, Walker (specify type) Home Equipment: None  Prior Device Use: Indicate devices/aids used by the patient prior to current illness, exacerbation or injury? single point cane for community ambulation; no AD while in house; does have access to RW at home if needed  Current Functional Level Cognition  Overall Cognitive Status: Within Functional Limits for tasks assessed Orientation Level:  Oriented X4    Extremity Assessment (includes Sensation/Coordination)  Upper Extremity Assessment: Overall WFL for tasks assessed  Lower Extremity Assessment: Generalized weakness(LLE strength and sensitation weaker than RLE. gross 3+/5)    ADLs  Overall ADL's : Needs assistance/impaired Eating/Feeding: Independent, Bed level Grooming: Set up, Sitting Upper Body Bathing: Minimal assistance, Sitting Lower Body Bathing: Total assistance, +2 for physical assistance, Sit to/from stand Upper Body Dressing : Minimal assistance, Sitting, Maximal assistance Upper Body Dressing Details (indicate cue type and reason): max for brace Lower Body Dressing: Total assistance, Sit to/from stand, +2 for physical assistance Toilet Transfer: Minimal assistance, +2 for physical assistance, BSC, RW Toileting- Clothing Manipulation and Hygiene: Total assistance, +2 for physical assistance, Sit to/from stand Functional mobility during ADLs: (can take steps along EOB with walker and 2 person min assist)    Mobility  Overal bed mobility: Needs Assistance Bed Mobility: Rolling, Sidelying to Sit Rolling: Min assist Sidelying to sit: Min assist General bed mobility comments: instructed in log roll technique, heavy reliance on rail, assist for LEs    Transfers  Overall transfer level: Needs assistance Equipment used: Rolling walker (2 wheeled) Transfers: Sit to/from Stand, Stand Pivot Transfers Sit to Stand: +2 physical assistance, Min assist Stand pivot transfers: +2 physical assistance, Min assist General transfer comment: assist to rise and steady, pt unable to feel her L LE    Ambulation / Gait / Stairs / Wheelchair Mobility  Ambulation/Gait General Gait Details: unable at this time     Posture / Balance Balance Overall balance assessment: Needs assistance Sitting balance-Leahy Scale: Fair Standing balance support: Bilateral upper extremity supported Standing balance-Leahy Scale: Poor     Special needs/care consideration BiPAP/CPAP : no CPM : no Continuous Drip IV : no Dialysis : no       Days : no Life Vest : no Oxygen : on RA Special Bed : no Trach Size : no Wound Vac (area) : no  Location : no Skin : surgical incision to back                Bowel mgmt: incontinent from cauda equina syndrome; last BM: 12/03/2018 Bladder mgmt: catheter  Diabetic mgmt: no Behavioral consideration : no Chemo/radiation : no   Previous Home Environment (from acute therapy documentation) Living Arrangements: Children Available Help at Discharge: Family, Available 24 hours/day(daughter) Type of Home: House Home Layout: Two level, Full bath on main level(can stay in son in laws office on first floor) Alternate Level Stairs-Number of Steps: flight Home Access: Stairs to enter Entrance Stairs-Rails: Right Entrance Stairs-Number of Steps: 3 Bathroom Shower/Tub: Chiropodist: Standard Home Care Services: No Additional Comments: reports MD was going to order RW, 3 in 1 and w/c prior to her scheduled back sx  Discharge Living Setting Plans for Discharge Living Setting: Patient's home, Lives with (comment)(daughter and son in law) Type of Home at Discharge: House Discharge Home Layout: Two level Alternate Level Stairs-Rails: Right Alternate Level Stairs-Number of Steps: 12 Discharge Home Access: Level entry Discharge Bathroom Shower/Tub: Tub/shower unit Discharge Bathroom Toilet: Standard Discharge Bathroom Accessibility: Yes How Accessible: Accessible via walker Does the patient have any problems obtaining your medications?: No  Social/Family/Support Systems Patient Roles: Other (Comment)(lives with family ) Contact Information: daughter: Addison Bailey cell: [phone]; home: [phone] Anticipated Caregiver: daugther  Anticipated Caregiver's Contact Information: see above Ability/Limitations of Caregiver: Min A Caregiver Availability: 24/7  Discharge Plan Discussed with Primary Caregiver: Yes Is Caregiver In Agreement with Plan?: Yes Does Caregiver/Family have Issues with Lodging/Transportation while Pt is in Rehab?: No  Goals/Additional Needs Patient/Family Goal for Rehab: PT/OT: Supervision; SLP: NA Expected length of stay: 7-10 days Cultural Considerations: Catholic  Dietary Needs: regular diet, thin lqiuids Equipment Needs: TBD Special Service Needs: SCI team Pt/Family Agrees to Admission and willing to participate: Yes Program Orientation Provided & Reviewed with Pt/Caregiver Including Roles  & Responsibilities: Yes(pt and daughter Denman George)  Barriers to Discharge: Home environment access/layout  Barriers to Discharge Comments: bedroom 2nd floor; family able to get bed downstairs but would prefer her to be able to navigate stairs (has full bath on 1st level as needed)  Decrease burden of Care through IP rehab admission: NA  Possible need for SNF placement upon discharge: Not anticipated; pt has 24/7 A at home from her daughter who is willing and able to assist as needed.   Patient Condition: I have reviewed medical records from [facility], spoken with RN, and patient and daughter. I met with patient at the bedside for inpatient rehabilitation assessment.  Patient will benefit from ongoing PT and OT, can actively participate in 3 hours of therapy a day 5 days of the week, and can make measurable gains during the admission.  Patient will also benefit from the coordinated team approach during an Inpatient Acute Rehabilitation admission.  The patient will receive intensive therapy as well as Rehabilitation physician, nursing, social worker, and care management interventions.  Due to bladder management, bowel management, safety, skin/wound care, disease management, medication administration, pain management and patient education the patient requires 24 hour a day rehabilitation nursing.  The patient is  currently Min A x2 for transfers (some side steps along bed) and Min A to Total A x 2 for basic ADLs.  Discharge setting and therapy post discharge at home with home health is anticipated.  Patient has agreed to participate in the Acute Inpatient Rehabilitation Program and will admit 12/04/2018.  Preadmission Screen  Completed By:  Jhonnie Garner, 12/04/2018 2:37 PM ______________________________________________________________________   Discussed status with Dr. Posey Pronto on 12/04/2018 at 11:58AM and received approval for admission today. cauda equina syndrome s/p decompression and fusion   Admission Coordinator:  Jhonnie Garner, OT, time 11:58AM/Date 12/04/2018   Assessment/Plan: Diagnosis: Cauda equina status post decompression  1. Does the need for close, 24 hr/day Medical supervision in concert with the patient's rehab needs make it unreasonable for this patient to be served in a less intensive setting? Potentially 2. Co-Morbidities requiring supervision/potential complications: CKD, HTN, HLD 3. Due to bladder management, bowel management, safety, skin/wound care, disease management, pain management and patient education, does the patient require 24 hr/day rehab nursing? Yes 4. Does the patient require coordinated care of a physician, rehab nurse, PT (1-2 hrs/day, 5 days/week) and OT (1-2 hrs/day, 5 days/week) to address physical and functional deficits in the context of the above medical diagnosis(es)? Potentially Addressing deficits in the following areas: balance, endurance, locomotion, strength, transferring, bathing, dressing, toileting and psychosocial support 5. Can the patient actively participate in an intensive therapy program of at least 3 hrs of therapy 5 days a week? Yes 6. The potential for patient to make measurable gains while on inpatient rehab is excellent 7. Anticipated functional outcomes upon discharge from inpatients are: modified independent and supervision PT, modified  independent and supervision OT, n/a SLP 8. Estimated rehab length of stay to reach the above functional goals is: 18-23 days. 9. Anticipated D/C setting: Home 10. Anticipated post D/C treatments: HH therapy and Home excercise program 11. Overall Rehab/Functional Prognosis: excellent and good  MD Signature: Delice Lesch, MD, ABPMR        Revision History Date/Time User Provider Type Action  12/04/2018 2:49 PM Jamse Arn, MD Physician Sign  12/04/2018 2:38 PM Jhonnie Garner, [facility] Admission Coordinator Share  12/04/2018 2:14 PM Jhonnie Garner, [facility] Admission Coordinator Share  View Details Report

## 2018-12-04 NOTE — Progress Notes (Signed)
Report given to CIR nurse. Awaiting bed availability to transfer pt.

## 2018-12-04 NOTE — Progress Notes (Signed)
Pt c/o L shoulder pain. Denies any other symptoms. No change in ROM to LUE. Observed swelling at deltoid muscle area. Pt states this problem stated today and denies any recent injury. Total assessment and VS noted. Given heat pad to area. Will reassess.

## 2018-12-04 NOTE — Progress Notes (Signed)
    Patient continues to report resolution of leg pain Has not been able to void, catheter placed   Physical Exam: Vitals:   12/03/18 2317 12/04/18 0424  BP: (!) 110/51 (!) 109/46  Pulse: 80 78  Resp: 18 16  Temp: 99.5 F (37.5 C) 98.4 F (36.9 C)  SpO2: 90% 90%    Patient appears comfortable Dressing in place 4/5 strength to left KE and 3/5 EHL, and 2/5 DF    POD #2 s/p decompression and fusion for cauda equina syndrome with resolved leg pain and improved strength  - pending transfer to CIR (if daughter is in agreement) - PT/OT - will follow neurologic recovery - patient again reminded that recovery will likely be slow

## 2018-12-04 NOTE — Progress Notes (Signed)
Physical Therapy Treatment Patient Details Name: Paula Hartman MRN: [id number] DOB: 23-Jan-1941 Today's Date: 12/04/2018    History of Present Illness Pt is a 78 year old woman admitted for emergent decompression and fusion L3-4 on 12/02/18. Pt was scheduled for a TLIF next week, but experienced loss of bowel and bladder control, severe L LE pain and B LE weakness due to cauda equina syndrome. PMH: HTN, renal disease.    PT Comments    Patient doing well with therapy today, demonstrating progress with activity OOB and level of assistance needed to stand. Worked with side stepping several laps along bed, patient ready to attempt ambulation with close chair follow next visit. Cont to strongly rec CIR.     Follow Up Recommendations  CIR     Equipment Recommendations  (TBD)    Recommendations for Other Services       Precautions / Restrictions Precautions Precautions: Back;Fall Precaution Booklet Issued: Yes (comment) Precaution Comments: began instruction in back precautions Required Braces or Orthoses: Spinal Brace Spinal Brace: Thoracolumbosacral orthotic;Applied in sitting position Restrictions Weight Bearing Restrictions: No    Mobility  Bed Mobility Overal bed mobility: Needs Assistance Bed Mobility: Rolling;Sidelying to Sit Rolling: Min assist Sidelying to sit: Min assist       General bed mobility comments: instructed in log roll technique, heavy reliance on rail, assist for LEs  Transfers Overall transfer level: Needs assistance Equipment used: Rolling walker (2 wheeled) Transfers: Sit to/from [employer] Transfers Sit to Stand: +2 physical assistance;Min assist Stand pivot transfers: +2 physical assistance;Min assist       General transfer comment: assist to rise and steady, pt unable to feel her L LE  Ambulation/Gait             General Gait Details: unable at this time    Stairs             Wheelchair Mobility    Modified  [place] (Stroke Patients Only)       Balance Overall balance assessment: Needs assistance   Sitting balance-Leahy Scale: Fair     Standing balance support: Bilateral upper extremity supported Standing balance-Leahy Scale: Poor                              Cognition Arousal/Alertness: Awake/alert Behavior During Therapy: Anxious Overall Cognitive Status: Within Functional Limits for tasks assessed                                        Exercises      General Comments        Pertinent Vitals/Pain      Home Living                      Prior Function            PT Goals (current goals can now be found in the care plan section) Acute Rehab PT Goals Patient Stated Goal: regain use of legs PT Goal Formulation: With patient Time For Goal Achievement: 12/17/18 Potential to Achieve Goals: Good    Frequency    Min 3X/week      PT Plan      Co-evaluation PT/OT/SLP Co-Evaluation/Treatment: Yes            AM-PAC PT "6 Clicks" Mobility   Outcome Measure  Help needed turning from  your back to your side while in a flat bed without using bedrails?: A Little Help needed moving from lying on your back to sitting on the side of a flat bed without using bedrails?: A Lot Help needed moving to and from a bed to a chair (including a wheelchair)?: A Lot Help needed standing up from a chair using your arms (e.g., wheelchair or bedside chair)?: A Lot Help needed to walk in hospital room?: A Lot Help needed climbing 3-5 steps with a railing? : Total 6 Click Score: 12    End of Session Equipment Utilized During Treatment: Gait belt Activity Tolerance: Patient tolerated treatment well Patient left: in bed;with call bell/phone within reach Nurse Communication: Mobility status PT Visit Diagnosis: Unsteadiness on feet (R26.81)     Time: 0940-1000 PT Time Calculation (min) (ACUTE ONLY): 20 min  Charges:  $Gait Training: 8-22  mins                    Reinaldo Berber, PT, DPT Acute Rehabilitation Services Pager: [phone] Office: [place] 12/04/2018, 10:36 AM
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
## 2018-12-04 NOTE — Progress Notes (Signed)
Inpatient Rehabilitation-Admissions Coordinator   Noted DC order placed in chart and DC summary added by primary service. Will accept pt to CIR today.   Please call if questions.   Jhonnie Garner, OTR/L  Rehab Admissions Coordinator  [phone] 12/04/2018 1:43 PM

## 2018-12-04 NOTE — Progress Notes (Signed)
Patient with AKI and ABLA--will repeat CBC/BMET in am. Start IVF for hydration. Order UA/UCS. Order stool guaiac

## 2018-12-04 NOTE — Discharge Summary (Signed)
Patient ID: Paula Hartman MRN: [id number] DOB/AGE: Aug 23, 1940 78 y.o.  Admit date: 12/02/2018 Discharge date: 12/04/2018  Admission Diagnoses:  Active Problems:   Radiculopathy Cauda Equina   Discharge Diagnoses:  Same  Past Medical History:  Diagnosis Date  . Allergic rhinitis   . Exposure to TB   . HTN (hypertension)   . Hyperlipidemia   . 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 female who was admitted 12/02/2018 for operative treatment of cauda equina. Patient has severe unremitting pain that affects sleep, daily activities, and work/hobbies. After pre-op clearance the patient was taken to the operating room on 12/02/2018 and underwent  Procedure(s): LEFT LUMBAR 3-4 TRANSFORAMINAL LUMBAR INTERBODY FUSION (TLIF) WITH INSTRUMENTATION AND ALLOGRAFT.    Patient was given perioperative antibiotics:  Anti-infectives (From admission, onward)   Start     Dose/Rate Route Frequency Ordered Stop   12/03/18 0600  ceFAZolin (ANCEF) IVPB 2g/100 mL premix     2 g 200 mL/hr over 30 Minutes Intravenous On call to O.R. 12/02/18 1320 12/02/18 1747   12/03/18 0500  ceFAZolin (ANCEF) IVPB 2g/100 mL premix     2 g 200 mL/hr over 30 Minutes Intravenous Every 8 hours 12/02/18 2243 12/03/18 1330       Patient was given sequential compression devices, early ambulation to prevent DVT.  Patient benefited maximally from hospital stay and there were no complications.    Recent vital signs:  Patient Vitals for the past 24 hrs:  BP Temp Temp src Pulse Resp SpO2  12/04/18 1213 (!) 141/53 98.4 F (36.9 C) Oral 89 18 94 %  12/04/18 1042 (!) 131/57 98.2 F (36.8 C) Oral 75 16  96 %  12/04/18 0732 (!) 116/51 97.8 F (36.6 C) Oral 70 (!) 8 96 %  12/04/18 0424 (!) 109/46 98.4 F (36.9 C) Oral 78 16 90 %  12/03/18 2317 (!) 110/51 99.5 F (37.5 C) Oral 80 18 90 %  12/03/18 2012 (!) 96/53 98.9 F (37.2 C) Oral 67 18 95 %  12/03/18 1530 (!) 100/54 99.2 F (37.3 C) Oral 72 19 94 %     Discharge Medications:   Allergies as of 12/04/2018   No Known Allergies     Medication List    TAKE these medications   diazepam 5 MG tablet Commonly known as: VALIUM Take 1 tablet (5 mg total) by mouth every 6 (six) hours as needed for muscle spasms.   gabapentin 300 MG capsule Commonly known as: NEURONTIN Take 300 mg by mouth 6 (six) times daily.   metoprolol tartrate 50 MG tablet Commonly known as: LOPRESSOR Take 50 mg by mouth 2 (two) times daily.   NIFEdipine 90 MG 24 hr tablet Commonly known as: PROCARDIA XL/NIFEDICAL-XL Take 90 mg by mouth daily.   oxyCODONE-acetaminophen 5-325 MG tablet Commonly known as: PERCOCET/ROXICET Take 1-2 tablets by mouth every 4 (four) hours as needed for moderate pain or severe pain.   pravastatin 40 MG tablet Commonly known as: PRAVACHOL Take 40 mg by mouth at bedtime.       Diagnostic Studies: Dg Lumbar Spine 2-3 Views  Result Date: 12/02/2018 CLINICAL DATA:  Left Lumbar 3-4 Transforaminal Lumbar Interbody Fusion (TLIF) w/ Instrumentation  and Allograft. EXAM: LUMBAR SPINE - 2-3 VIEW; DG C-ARM 61-120 MIN COMPARISON:  Preoperative MRI earlier this day. FINDINGS: Two fluoroscopic spot images obtained in the operating room in frontal and lateral projections demonstrate posterior rod and transpedicular screw fusion at L3-L4 with interbody spacer. Fluoroscopy time 1 minutes 52 seconds. IMPRESSION: Procedural fluoroscopy L3-L4 fusion. Electronically Signed   By: Keith Rake M.D.   On: 12/02/2018 21:35   Mr Lumbar Spine Wo Contrast  Result Date: 12/02/2018 CLINICAL DATA:  Severe low back pain and right leg pain. Numbness and  tingling in the right leg. EXAM: MRI LUMBAR SPINE WITHOUT CONTRAST TECHNIQUE: Multiplanar, multisequence MR imaging of the lumbar spine was performed. No intravenous contrast was administered. COMPARISON:  MRI dated 10/26/2018 FINDINGS: Segmentation:  Standard. Alignment:  Straightening of the normal lumbar lordosis. Vertebrae:  No fracture, evidence of discitis, or bone lesion. Conus medullaris and cauda equina: Conus extends to the L1-2 level. Conus appears normal. Paraspinal and other soft tissues: Cholelithiasis. Otherwise negative. Disc levels: T12-L1: Negative. L1-2: Small central subligamentous disc protrusion without neural impingement, unchanged. L2-3: Broad-based disc bulge with a central subligamentous disc protrusion slightly asymmetric to the right with compression of the thecal sac, unchanged and without focal neural impingement. L3-4: Large central disc extrusion extending superiorly and inferiorly obliterating the thecal sac appearing even more extensive than on the prior exam. This traps all of the nerves at this level. No foraminal stenosis. L4-5: Broad-based disc bulge with central disc protrusion slightly asymmetric to the left compressing the thecal sac and the left lateral recess, unchanged since the prior study. Slight hypertrophy of the ligamentum flavum and facet joints. L5-S1: Tiny broad-based disc bulge with no neural impingement. IMPRESSION: 1. Large soft disc extrusion at L3-4 severely compressing the thecal sac even more so than on the prior study. This should affect the L4 and more distal nerve roots bilaterally. 2. Stable soft disc protrusions at L2-3 and L4-5 as described. Electronically Signed   By: Lorriane Shire M.D.   On: 12/02/2018 10:45   Dg Lumbar Spine 1 View  Result Date: 12/02/2018 CLINICAL DATA:  Elective surgery. Left lumbar 3-4 transforaminal lumbar interbody fusion. EXAM: LUMBAR SPINE - 1 VIEW COMPARISON:  MRI earlier this day. FINDINGS: Spinal numbering as on prior  lumbar MRI. Single portable cross-table lateral view obtained in the operating room. Surgical instruments localize posterior to L2 and L3 spinous processes. IMPRESSION: Intraoperative radiograph with surgical instruments posterior to L2 and L3. Electronically Signed   By: Keith Rake M.D.   On: 12/02/2018 21:33   Dg C-arm 1-60 Min  Result Date: 12/02/2018 CLINICAL DATA:  Left Lumbar 3-4 Transforaminal Lumbar Interbody Fusion (TLIF) w/ Instrumentation and Allograft. EXAM: LUMBAR SPINE - 2-3 VIEW; DG C-ARM 61-120 MIN COMPARISON:  Preoperative MRI earlier this day. FINDINGS: Two fluoroscopic spot images obtained in the operating room in frontal and lateral projections demonstrate posterior rod and transpedicular screw fusion at L3-L4 with interbody spacer. Fluoroscopy time 1 minutes 52 seconds. IMPRESSION: Procedural fluoroscopy L3-L4 fusion. Electronically Signed   By: Keith Rake M.D.   On: 12/02/2018 21:35    Disposition: Discharge disposition: [facility] Not Defined      POD #2 s/pdecompression and fusion for cauda equina syndromewith resolved leg pain and improved strength  - pending transfer to CIR - PT/OT - will follow neurologic recovery - patient again reminded that recovery will likely be slow -D/C today to CIR -F/U in office 2 weeks   Signed: Lonn Georgia  J Alexsis Kathman 12/04/2018, 1:03 PM

## 2018-12-04 NOTE — Progress Notes (Signed)
Pt transferred to CIR bed 17 with all belongings. Dentures in pt mouth.

## 2018-12-04 NOTE — Progress Notes (Signed)
Pt arrived to unit, alert and oriented, able to make needs known, temp 100.1, no c/o pain, pt noted with left foot drop with numbness noted to same left lower ext. Pt was unable to void after surgery and foley was placed yesterday. Pt has had BM today but was hard and constipation was an issue prior to surgery as well.

## 2018-12-04 NOTE — Progress Notes (Signed)
Inpatient Rehabilitation-Admissions Coordinator   [facility] has met with pt at the bedside. Pt and family still wanting to pursue CIR at this time. AC has a bed to offer pt today. Awaiting medical clearance from Dr. Lynann Bologna for possible admit.   Will follow up once clearance provided.   Bedside RN aware.   Please call if questions.   Jhonnie Garner, OTR/L  Rehab Admissions Coordinator  [phone] 12/04/2018 10:45 AM

## 2018-12-04 NOTE — Plan of Care (Signed)
  Problem: Education: Goal: Ability to verbalize activity precautions or restrictions will improve Outcome: Progressing   Problem: Activity: Goal: Ability to avoid complications of mobility impairment will improve Outcome: Progressing   

## 2018-12-05 ENCOUNTER — Inpatient Hospital Stay (HOSPITAL_COMMUNITY): Payer: Medicare Other | Admitting: Physical Therapy

## 2018-12-05 ENCOUNTER — Inpatient Hospital Stay (HOSPITAL_COMMUNITY): Payer: Medicare Other | Admitting: Occupational Therapy

## 2018-12-05 DIAGNOSIS — N179 Acute kidney failure, unspecified: Secondary | ICD-10-CM | POA: Insufficient documentation

## 2018-12-05 DIAGNOSIS — D62 Acute posthemorrhagic anemia: Secondary | ICD-10-CM

## 2018-12-05 DIAGNOSIS — R000 Tachycardia, unspecified: Secondary | ICD-10-CM

## 2018-12-05 DIAGNOSIS — I10 Essential (primary) hypertension: Secondary | ICD-10-CM

## 2018-12-05 DIAGNOSIS — N189 Chronic kidney disease, unspecified: Secondary | ICD-10-CM | POA: Insufficient documentation

## 2018-12-05 DIAGNOSIS — R339 Retention of urine, unspecified: Secondary | ICD-10-CM

## 2018-12-05 DIAGNOSIS — G834 Cauda equina syndrome: Secondary | ICD-10-CM

## 2018-12-05 LAB — BASIC METABOLIC PANEL
Anion gap: 12 (ref 5–15)
BUN: 35 mg/dL — ABNORMAL HIGH (ref 8–23)
CO2: 21 mmol/L — ABNORMAL LOW (ref 22–32)
Calcium: 8.4 mg/dL — ABNORMAL LOW (ref 8.9–10.3)
Chloride: 106 mmol/L (ref 98–111)
Creatinine, Ser: 1.88 mg/dL — ABNORMAL HIGH (ref 0.44–1.00)
GFR calc Af Amer: 29 mL/min — ABNORMAL LOW (ref >60)
GFR calc non Af Amer: 25 mL/min — ABNORMAL LOW (ref >60)
Glucose, Bld: 104 mg/dL — ABNORMAL HIGH (ref 70–99)
Potassium: 4.9 mmol/L (ref 3.5–5.1)
Sodium: 139 mmol/L (ref 135–145)

## 2018-12-05 LAB — URINALYSIS, ROUTINE W REFLEX MICROSCOPIC
Bilirubin Urine: NEGATIVE
Glucose, UA: NEGATIVE mg/dL
Ketones, ur: NEGATIVE mg/dL
Nitrite: NEGATIVE
Protein, ur: NEGATIVE mg/dL
Specific Gravity, Urine: 1.003 — ABNORMAL LOW (ref 1.005–1.030)
pH: 6 (ref 5.0–8.0)

## 2018-12-05 LAB — HEMOGLOBIN AND HEMATOCRIT, BLOOD
HCT: 28 % — ABNORMAL LOW (ref 36.0–46.0)
Hemoglobin: 9 g/dL — ABNORMAL LOW (ref 12.0–15.0)

## 2018-12-05 LAB — CBC
HCT: 21.6 % — ABNORMAL LOW (ref 36.0–46.0)
Hemoglobin: 6.8 g/dL — CL (ref 12.0–15.0)
MCH: 30.9 pg (ref 26.0–34.0)
MCHC: 31.5 g/dL (ref 30.0–36.0)
MCV: 98.2 fL (ref 80.0–100.0)
Platelets: 273 10*3/uL (ref 150–400)
RBC: 2.2 MIL/uL — ABNORMAL LOW (ref 3.87–5.11)
RDW: 13 % (ref 11.5–15.5)
WBC: 6.8 10*3/uL (ref 4.0–10.5)
nRBC: 0 % (ref 0.0–0.2)

## 2018-12-05 LAB — DIRECT ANTIGLOBULIN TEST (NOT AT ARMC)
DAT, IgG: NEGATIVE
DAT, complement: NEGATIVE

## 2018-12-05 LAB — PREPARE RBC (CROSSMATCH)

## 2018-12-05 MED ORDER — GABAPENTIN 300 MG PO CAPS
300.0000 mg | ORAL_CAPSULE | Freq: Three times a day (TID) | ORAL | Status: DC
  Administered 2018-12-05 – 2018-12-10 (×9): 300 mg via ORAL
  Filled 2018-12-05 (×10): qty 1

## 2018-12-05 MED ORDER — SODIUM CHLORIDE 0.9% IV SOLUTION: Freq: Once | INTRAVENOUS | Status: DC

## 2018-12-05 NOTE — Progress Notes (Signed)
[facility] PHYSICAL MEDICINE & REHABILITATION PROGRESS NOTE  Subjective/Complaints: Patient seen sitting up at the edge of her bed working with therapy this morning.  She states she slept well overnight.  Per therapies, patient complains of back pain.  Recently received pain medications.  Later informed by nursing regarding critical value of hemoglobin.  She wants to go home.  Overnight also noted to have AKI, started on IVF.  ROS: + Back pain.  Denies CP, shortness of breath, nausea, vomiting, diarrhea.  Objective: Vital Signs: Blood pressure (!) 125/56, pulse 85, temperature 99.9 F (37.7 C), resp. rate 17, height 4\' 7"  (1.397 m), weight 56.2 kg, SpO2 93 %. Dg Abd 1 View  Result Date: 12/04/2018 CLINICAL DATA:  Constipation EXAM: ABDOMEN - 1 VIEW COMPARISON:  None. FINDINGS: Scattered large and small bowel gas is noted. No free air is seen. No significant retained fecal material is noted. Postsurgical changes in the lumbar spine are noted. No acute bony abnormality is seen. IMPRESSION: No evidence of constipation. Electronically Signed   By: Inez Catalina M.D.   On: 12/04/2018 22:20   Recent Labs    12/04/18 1705 12/05/18 0613  WBC 7.9 6.8  HGB 7.0* 6.8*  HCT 22.2* 21.6*  PLT 276 273   Recent Labs    12/04/18 1705 12/05/18 0613  NA 136 139  K 5.3* 4.9  CL 102 106  CO2 24 21*  GLUCOSE 120* 104*  BUN 41* 35*  CREATININE 2.25* 1.88*  CALCIUM 8.6* 8.4*    Physical Exam: BP (!) 125/56 (BP Location: Right Arm)   Pulse 85   Temp 99.9 F (37.7 C)   Resp 17   Ht 4\' 7"  (1.397 m)   Wt 56.2 kg   SpO2 93%   BMI 28.80 kg/m  Constitutional: No distress . Vital signs reviewed. HENT: Normocephalic.  Atraumatic. Eyes: EOMI. No discharge. Cardiovascular: No JVD. Respiratory: Normal effort. GI: Non-distended. Musc: No edema or tenderness in extremities. Neurological: Alert and oriented Motor: B/l UE 5/5 proximal to distal, stable RLE: Hip flexion, knee extension 4-/5, ankle  dorsiflexion 3+/5  LLE: HF, KE 3+/5, ADF 2 /5  Sensation diminished to light touch left lower extremity Skin: Back incision with dressing C/D/I Psychiatric: She has a normal mood and affect. Her behavior is normal.   Assessment/Plan: 1. Functional deficits secondary to cauda equina syndrome which require 3+ hours per day of interdisciplinary therapy in a comprehensive inpatient rehab setting.  Physiatrist is providing close team supervision and 24 hour management of active medical problems listed below.  Physiatrist and rehab team continue to assess barriers to discharge/monitor patient progress toward functional and medical goals  Care Tool:  Bathing              Bathing assist       Upper Body Dressing/Undressing Upper body dressing        Upper body assist      Lower Body Dressing/Undressing Lower body dressing      What is the patient wearing?: Underwear/pull up     Lower body assist Assist for lower body dressing: Maximal Assistance - Patient 25 - 49%     Toileting Toileting    Toileting assist Assist for toileting: Maximal Assistance - Patient 25 - 49%     Transfers Chair/bed transfer  Transfers assist           Locomotion Ambulation   Ambulation assist              Walk  10 feet activity   Assist           Walk 50 feet activity   Assist           Walk 150 feet activity   Assist           Walk 10 feet on uneven surface  activity   Assist           Wheelchair     Assist               Wheelchair 50 feet with 2 turns activity    Assist            Wheelchair 150 feet activity     Assist            Medical Problem List and Plan: 1.  Gait abnormality, weakness, numbness secondary to cauda equina syndrome.  Begin CIR evaluations 2.  Antithrombotics: -DVT/anticoagulation:  Mechanical: Sequential compression devices, below knee Bilateral lower extremities              -antiplatelet therapy: N/A 3. Pain Management: Oxycodone prn.    Gabapentin decreased to 300 3 times daily on 7/11 4. Mood: LCSW to follow for evaluation and support.              -antipsychotic agents: N/A 5. Neuropsych: This patient is capable of making decisions on her  own behalf. 6. Skin/Wound Care: Monitor wound daily for healing.  7. Fluids/Electrolytes/Nutrition: Monitor I/O.   8. HTN: Monitor BP bid--continue Nifedipine daily with lopressor bid.    Monitor with increased mobility 9. Tachycardia: asymptomatic--monitor HR  tid.    Monitor with increased mobility 10. Urinary retention:   Discontinue Foley   Voiding trial   UA equivocal, urine culture pending  11.  Bowel incontinence: KUB reviewed, unremarkable   Continue bowel meds 12.  AKI  Creatinine 1.88 on 7/11, labs ordered for tomorrow  Fluid bolus given 13.  Acute blood loss anemia  Hemoglobin 6.8 on 7/11, labs ordered for tomorrow  Transfuse 1 unit PRBC   LOS: 1 days A FACE TO FACE EVALUATION WAS PERFORMED  Pearla Mckinny Lorie Phenix 12/05/2018, 8:55 AM

## 2018-12-05 NOTE — Significant Event (Signed)
CRITICAL VALUE ALERT  Critical Value:  Hemoglobin 6.8  Date & Time Notied:  12/05/18 0815   Provider Notified: MD Posey Pronto    Orders Received/Actions taken: New orders for transfusion
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
## 2018-12-05 NOTE — Progress Notes (Signed)
Upon first round at change of shift, patient complaining of pain to Rt arm at IV site. Area appears swollen and IV leaking. NIght shift RN removed IV and tylenol administered to patient.
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
## 2018-12-06 DIAGNOSIS — R0989 Other specified symptoms and signs involving the circulatory and respiratory systems: Secondary | ICD-10-CM

## 2018-12-06 LAB — URINE CULTURE: Culture: NO GROWTH

## 2018-12-06 LAB — BASIC METABOLIC PANEL
Anion gap: 8 (ref 5–15)
BUN: 26 mg/dL — ABNORMAL HIGH (ref 8–23)
CO2: 24 mmol/L (ref 22–32)
Calcium: 9.1 mg/dL (ref 8.9–10.3)
Chloride: 108 mmol/L (ref 98–111)
Creatinine, Ser: 1.48 mg/dL — ABNORMAL HIGH (ref 0.44–1.00)
GFR calc Af Amer: 39 mL/min — ABNORMAL LOW (ref >60)
GFR calc non Af Amer: 33 mL/min — ABNORMAL LOW (ref >60)
Glucose, Bld: 110 mg/dL — ABNORMAL HIGH (ref 70–99)
Potassium: 4.6 mmol/L (ref 3.5–5.1)
Sodium: 140 mmol/L (ref 135–145)

## 2018-12-06 LAB — TYPE AND SCREEN
ABO/RH(D): O POS
Antibody Screen: NEGATIVE
Unit division: 0

## 2018-12-06 LAB — CBC WITH DIFFERENTIAL/PLATELET
Abs Immature Granulocytes: 0.04 10*3/uL (ref 0.00–0.07)
Basophils Absolute: 0 10*3/uL (ref 0.0–0.1)
Basophils Relative: 1 %
Eosinophils Absolute: 0.4 10*3/uL (ref 0.0–0.5)
Eosinophils Relative: 5 %
HCT: 30.2 % — ABNORMAL LOW (ref 36.0–46.0)
Hemoglobin: 9.5 g/dL — ABNORMAL LOW (ref 12.0–15.0)
Immature Granulocytes: 1 %
Lymphocytes Relative: 22 %
Lymphs Abs: 1.6 10*3/uL (ref 0.7–4.0)
MCH: 29.7 pg (ref 26.0–34.0)
MCHC: 31.5 g/dL (ref 30.0–36.0)
MCV: 94.4 fL (ref 80.0–100.0)
Monocytes Absolute: 0.6 10*3/uL (ref 0.1–1.0)
Monocytes Relative: 8 %
Neutro Abs: 4.7 10*3/uL (ref 1.7–7.7)
Neutrophils Relative %: 63 %
Platelets: 330 10*3/uL (ref 150–400)
RBC: 3.2 MIL/uL — ABNORMAL LOW (ref 3.87–5.11)
RDW: 15 % (ref 11.5–15.5)
WBC: 7.3 10*3/uL (ref 4.0–10.5)
nRBC: 0 % (ref 0.0–0.2)

## 2018-12-06 LAB — BPAM RBC
Blood Product Expiration Date: 202008072359
ISSUE DATE / TIME: 202007111243
Unit Type and Rh: 5100

## 2018-12-06 LAB — OCCULT BLOOD X 1 CARD TO LAB, STOOL: Fecal Occult Bld: NEGATIVE

## 2018-12-06 NOTE — Progress Notes (Signed)
+/-   sleep. C/O abd.fullness. BS + X 4 quads. PRN miralax given at 2145. No voids, I & O Cath at 2250=600cc's, at 0430, I & O cath=600cc's. Reports LLE numbness, foot drop. Denies need for PRN pain meds. Patrici Ranks A

## 2018-12-06 NOTE — Plan of Care (Signed)
  Problem: Consults Goal: RH GENERAL PATIENT EDUCATION Description: See Patient Education module for education specifics. Outcome: Progressing Goal: Skin Care Protocol Initiated - if Braden Score 18 or less Description: If consults are not indicated, leave blank or document N/A Outcome: Progressing   Problem: RH BOWEL ELIMINATION Goal: RH STG MANAGE BOWEL WITH ASSISTANCE Description: STG Manage Bowel with mod I Assistance. Outcome: Progressing   Problem: RH SKIN INTEGRITY Goal: RH STG SKIN FREE OF INFECTION/BREAKDOWN Outcome: Progressing Goal: RH STG MAINTAIN SKIN INTEGRITY WITH ASSISTANCE Description: STG Maintain Skin Integrity With mod I Assistance. Outcome: Progressing   Problem: RH SAFETY Goal: RH STG ADHERE TO SAFETY PRECAUTIONS W/ASSISTANCE/DEVICE Description: STG Adhere to Safety Precautions With Assistance/Device. Outcome: Progressing   Problem: RH PAIN MANAGEMENT Goal: RH STG PAIN MANAGED AT OR BELOW PT'S PAIN GOAL Outcome: Progressing   Problem: RH KNOWLEDGE DEFICIT GENERAL Goal: RH STG INCREASE KNOWLEDGE OF SELF CARE AFTER HOSPITALIZATION Outcome: Progressing   Problem: Consults Goal: RH SPINAL CORD INJURY PATIENT EDUCATION Description:  See Patient Education module for education specifics.  Outcome: Progressing Goal: Skin Care Protocol Initiated - if Braden Score 18 or less Description: If consults are not indicated, leave blank or document N/A Outcome: Progressing Goal: Nutrition Consult-if indicated Outcome: Progressing   Problem: SCI BOWEL ELIMINATION Goal: RH STG MANAGE BOWEL WITH ASSISTANCE Description: STG Manage Bowel with mod I Assistance. Outcome: Progressing Goal: RH STG SCI MANAGE BOWEL PROGRAM W/ASSIST OR AS APPROPRIATE Description: STG SCI Manage bowel program w/assist or as appropriate. Outcome: Progressing   Problem: SCI BLADDER ELIMINATION Goal: RH STG MANAGE BLADDER WITH ASSISTANCE Description: STG Manage Bladder With mod I  Assistance Outcome: Progressing   Problem: RH SKIN INTEGRITY Goal: RH STG SKIN FREE OF INFECTION/BREAKDOWN Outcome: Progressing Goal: RH STG MAINTAIN SKIN INTEGRITY WITH ASSISTANCE Description: STG Maintain Skin Integrity With mod I Assistance. Outcome: Progressing   Problem: RH SAFETY Goal: RH STG ADHERE TO SAFETY PRECAUTIONS W/ASSISTANCE/DEVICE Description: STG Adhere to Safety Precautions With Assistance/Device. Outcome: Progressing   Problem: RH PAIN MANAGEMENT Goal: RH STG PAIN MANAGED AT OR BELOW PT'S PAIN GOAL Description: Pain scale <4/10 Outcome: Progressing   Problem: RH KNOWLEDGE DEFICIT SCI Goal: RH STG INCREASE KNOWLEDGE OF SELF CARE AFTER SCI Outcome: Progressing

## 2018-12-06 NOTE — Progress Notes (Signed)
[facility] PHYSICAL MEDICINE & REHABILITATION PROGRESS NOTE  Subjective/Complaints: Patient seen sitting up in bed this morning.  She states she slept well overnight.  She states that a good first day of therapies yesterday.  ROS: Denies CP, shortness of breath, nausea, vomiting, diarrhea.  Objective: Vital Signs: Blood pressure (!) 147/50, pulse 77, temperature 99 F (37.2 C), temperature source Oral, resp. rate 17, height 4\' 7"  (1.397 m), weight 56.2 kg, SpO2 97 %. Dg Abd 1 View  Result Date: 12/04/2018 CLINICAL DATA:  Constipation EXAM: ABDOMEN - 1 VIEW COMPARISON:  None. FINDINGS: Scattered large and small bowel gas is noted. No free air is seen. No significant retained fecal material is noted. Postsurgical changes in the lumbar spine are noted. No acute bony abnormality is seen. IMPRESSION: No evidence of constipation. Electronically Signed   By: Inez Catalina M.D.   On: 12/04/2018 22:20   Recent Labs    12/05/18 0613 12/05/18 1812 12/06/18 0719  WBC 6.8  --  7.3  HGB 6.8* 9.0* 9.5*  HCT 21.6* 28.0* 30.2*  PLT 273  --  330   Recent Labs    12/05/18 0613 12/06/18 0719  NA 139 140  K 4.9 4.6  CL 106 108  CO2 21* 24  GLUCOSE 104* 110*  BUN 35* 26*  CREATININE 1.88* 1.48*  CALCIUM 8.4* 9.1    Physical Exam: BP (!) 147/50   Pulse 77   Temp 99 F (37.2 C) (Oral)   Resp 17   Ht 4\' 7"  (1.397 m)   Wt 56.2 kg   SpO2 97%   BMI 28.80 kg/m  Constitutional: No distress . Vital signs reviewed. HENT: Normocephalic.  Atraumatic. Eyes: EOMI.  No discharge. Cardiovascular: No JVD. Respiratory: Normal effort. GI: Non-distended. Musc: No edema or tenderness in extremities. Neurological: Alert and oriented Motor: B/l UE 5/5 proximal to distal, stable RLE: Hip flexion, knee extension 4-/5, ankle dorsiflexion 3+/5, stable LLE: HF, KE 3+/5, ADF 1/5  Sensation diminished to light touch left lower extremity Skin: Back incision with dressing C/D/I Psychiatric: She has a normal  mood and affect. Her behavior is normal.   Assessment/Plan: 1. Functional deficits secondary to cauda equina syndrome which require 3+ hours per day of interdisciplinary therapy in a comprehensive inpatient rehab setting.  Physiatrist is providing close team supervision and 24 hour management of active medical problems listed below.  Physiatrist and rehab team continue to assess barriers to discharge/monitor patient progress toward functional and medical goals  Care Tool:  Bathing  Bathing activity did not occur: Refused           Bathing assist       Upper Body Dressing/Undressing Upper body dressing   What is the patient wearing?: Hospital gown only    Upper body assist Assist Level: Minimal Assistance - Patient > 75%    Lower Body Dressing/Undressing Lower body dressing      What is the patient wearing?: Incontinence brief     Lower body assist Assist for lower body dressing: Maximal Assistance - Patient 25 - 49%     Toileting Toileting Toileting Activity did not occur (Clothing management and hygiene only): N/A (no void or bm)(no bm, I & O cath)  Toileting assist Assist for toileting: Maximal Assistance - Patient 25 - 49%     Transfers Chair/bed transfer  Transfers assist     Chair/bed transfer assist level: Maximal Assistance - Patient 25 - 49%     Locomotion Ambulation   Ambulation assist  Assist level: Total Assistance - Patient < 25% Assistive device: Walker-rolling Max distance: 5'   Walk 10 feet activity   Assist  Walk 10 feet activity did not occur: Safety/medical concerns        Walk 50 feet activity   Assist Walk 50 feet with 2 turns activity did not occur: Safety/medical concerns         Walk 150 feet activity   Assist Walk 150 feet activity did not occur: Safety/medical concerns         Walk 10 feet on uneven surface  activity   Assist Walk 10 feet on uneven surfaces activity did not occur:  Safety/medical concerns         Wheelchair     Assist Will patient use wheelchair at discharge?: Yes Type of Wheelchair: Manual    Wheelchair assist level: Supervision/Verbal cueing Max wheelchair distance: 150'    Wheelchair 50 feet with 2 turns activity    Assist        Assist Level: Supervision/Verbal cueing   Wheelchair 150 feet activity     Assist     Assist Level: Supervision/Verbal cueing      Medical Problem List and Plan: 1.  Gait abnormality, weakness, numbness secondary to cauda equina syndrome.  Continue CIR 2.  Antithrombotics: -DVT/anticoagulation:  Mechanical: Sequential compression devices, below knee Bilateral lower extremities             -antiplatelet therapy: N/A 3. Pain Management: Oxycodone prn.    Gabapentin decreased to 300 3 times daily on 7/11 given AKI as well as this being more acceptable dosing, tolerating at present 4. Mood: LCSW to follow for evaluation and support.              -antipsychotic agents: N/A 5. Neuropsych: This patient is capable of making decisions on her  own behalf. 6. Skin/Wound Care: Monitor wound daily for healing.  7. Fluids/Electrolytes/Nutrition: Monitor I/O.   8. HTN: Monitor BP bid--continue Nifedipine daily with lopressor bid.    Labile on 7/12  Monitor with increased mobility 9. Tachycardia: asymptomatic--monitor HR  tid.    Heart rate within normal is on 7/12  Monitor with increased mobility 10. Urinary retention:   Discontinue Foley   Voiding trial   UA equivocal, urine culture remains pending  11.  Bowel incontinence: KUB reviewed, unremarkable   Continue bowel meds 12.  AKI  Creatinine 1.48 on 7/12, labs ordered for tomorrow  Fluid bolus given 13.  Acute blood loss anemia  Hemoglobin 9.5 on 7/12 after transfusion of 1 unit PRBC on 7/11  LOS: 2 days A FACE TO FACE EVALUATION WAS PERFORMED  [name] Lorie Phenix 12/06/2018, 9:15 AM

## 2018-12-06 NOTE — Progress Notes (Signed)
This afternoon ABD pad on back incision appears shredded and completely detached from patient. No drainage noted on dressing or patient. Incision with steris- clean and dry. New ABD pad and tape applied.

## 2018-12-07 ENCOUNTER — Inpatient Hospital Stay (HOSPITAL_COMMUNITY): Payer: Medicare Other | Admitting: Physical Therapy

## 2018-12-07 ENCOUNTER — Inpatient Hospital Stay (HOSPITAL_COMMUNITY): Admission: RE | Admit: 2018-12-07 | Payer: Medicare Other | Source: Ambulatory Visit

## 2018-12-07 ENCOUNTER — Other Ambulatory Visit (HOSPITAL_COMMUNITY): Payer: Medicare Other

## 2018-12-07 ENCOUNTER — Inpatient Hospital Stay (HOSPITAL_COMMUNITY): Payer: Medicare Other | Admitting: Occupational Therapy

## 2018-12-07 LAB — BASIC METABOLIC PANEL
Anion gap: 9 (ref 5–15)
BUN: 27 mg/dL — ABNORMAL HIGH (ref 8–23)
CO2: 24 mmol/L (ref 22–32)
Calcium: 9.4 mg/dL (ref 8.9–10.3)
Chloride: 107 mmol/L (ref 98–111)
Creatinine, Ser: 1.43 mg/dL — ABNORMAL HIGH (ref 0.44–1.00)
GFR calc Af Amer: 40 mL/min — ABNORMAL LOW (ref >60)
GFR calc non Af Amer: 35 mL/min — ABNORMAL LOW (ref >60)
Glucose, Bld: 111 mg/dL — ABNORMAL HIGH (ref 70–99)
Potassium: 4.4 mmol/L (ref 3.5–5.1)
Sodium: 140 mmol/L (ref 135–145)

## 2018-12-07 LAB — CBC
HCT: 32.2 % — ABNORMAL LOW (ref 36.0–46.0)
Hemoglobin: 10 g/dL — ABNORMAL LOW (ref 12.0–15.0)
MCH: 29.5 pg (ref 26.0–34.0)
MCHC: 31.1 g/dL (ref 30.0–36.0)
MCV: 95 fL (ref 80.0–100.0)
Platelets: 411 10*3/uL — ABNORMAL HIGH (ref 150–400)
RBC: 3.39 MIL/uL — ABNORMAL LOW (ref 3.87–5.11)
RDW: 14.3 % (ref 11.5–15.5)
WBC: 7.6 10*3/uL (ref 4.0–10.5)
nRBC: 0 % (ref 0.0–0.2)

## 2018-12-07 NOTE — Progress Notes (Signed)
Occupational Therapy Session Note  Patient Details  Name: Durene Dodge MRN: [id number] Date of Birth: Nov 14, 1940  Today's Date: 12/07/2018 OT Individual Time: 1050-1202 OT Individual Time Calculation (min): 72 min    Short Term Goals: Week 1:  OT Short Term Goal 1 (Week 1): Pt will perform toilet transfer at mod A consistently. OT Short Term Goal 2 (Week 1): Pt will perform LB dressing at mod A overall. OT Short Term Goal 3 (Week 1): Pt will perform toileting at mod A overall.  Skilled Therapeutic Interventions/Progress Updates:    Pt completed transfer from supine to sit EOB with min assist.  She was able to only state 1/3 back precautions when asked.  She sat EOB with max assist to donn TLSO.  Once applied she completed squat pivot transfer to the wheelchair with mod assist.  She was able to then perform wheelchair mobility with supervision down to the dayroom.  Had pt transfer to the Nustep with mod assist to attempt use, however she demonstrated moderated difficulty coordinating functional use on several attempts and could not maintain her left heel down on the foot support.  Had pt transfer back to the wheelchair and then transfer over to the therapy mat with min assist.  Worked on sit to stand and standing balance with therapist helping to support her in the front.  Decreased trunk and hip extension noted with pt needing mod assist for sit to stand and mod facilitation to maintain standing.  Increased bilateral knee hyperextension present with all standing trials for up to 1 1/2 mins.  Next had pt transfer back to the wheelchair with min assist and therapist took her down to the ortho gym where she completed 15 mins on the UE ergonometer for BUE strengthening and endurance.  Resistance was set on level 6 for each set of 7.5 mins.  She was able to maintain speed of 24-29 RPMs with 1 set peddling forward and 1 set peddling backwards.  Returned to room at end of session and per pt's choice,  she transferred back to the bed with min assist squat pivot.  Sit to supine with min assist to bring the LEs into the bed.  Pt left with bed alarm in place and call button and phone in reach.   Therapy Documentation Precautions:  Precautions Precautions: Back, Fall Precaution Comments: reviewed spinal precautions Required Braces or Orthoses: Spinal Brace Spinal Brace: Thoracolumbosacral orthotic Restrictions Weight Bearing Restrictions: No  Pain: Pain Assessment Pain Scale: 0-10 Pain Score: 6  Pain Type: Surgical pain Pain Location: Back Pain Orientation: Lower Pain Descriptors / Indicators: Aching Pain Frequency: Intermittent Pain Onset: On-going Patients Stated Pain Goal: 3 Pain Intervention(s): Medication (See eMAR)(tylenol given) Multiple Pain Sites: No ADL: See Care Tool Section for some details of ADLs  Therapy/Group: Individual Therapy  Anastasha Ortez OTR/L 12/07/2018, 12:44 PM

## 2018-12-07 NOTE — Plan of Care (Signed)
  Problem: Consults Goal: RH GENERAL PATIENT EDUCATION Description: See Patient Education module for education specifics. Outcome: Progressing Goal: Skin Care Protocol Initiated - if Braden Score 18 or less Description: If consults are not indicated, leave blank or document N/A Outcome: Progressing   Problem: RH BOWEL ELIMINATION Goal: RH STG MANAGE BOWEL WITH ASSISTANCE Description: STG Manage Bowel with mod I Assistance. Outcome: Progressing   Problem: RH BLADDER ELIMINATION Goal: RH STG MANAGE BLADDER WITH ASSISTANCE Description: STG Manage Bladder With mod I Assistance Outcome: Progressing   Problem: RH SKIN INTEGRITY Goal: RH STG SKIN FREE OF INFECTION/BREAKDOWN Outcome: Progressing Goal: RH STG MAINTAIN SKIN INTEGRITY WITH ASSISTANCE Description: STG Maintain Skin Integrity With mod I Assistance. Outcome: Progressing   Problem: RH SAFETY Goal: RH STG ADHERE TO SAFETY PRECAUTIONS W/ASSISTANCE/DEVICE Description: STG Adhere to Safety Precautions With Assistance/Device. Outcome: Progressing   Problem: RH PAIN MANAGEMENT Goal: RH STG PAIN MANAGED AT OR BELOW PT'S PAIN GOAL Outcome: Progressing   Problem: RH KNOWLEDGE DEFICIT GENERAL Goal: RH STG INCREASE KNOWLEDGE OF SELF CARE AFTER HOSPITALIZATION Outcome: Progressing   Problem: Consults Goal: RH SPINAL CORD INJURY PATIENT EDUCATION Description:  See Patient Education module for education specifics.  Outcome: Progressing Goal: Skin Care Protocol Initiated - if Braden Score 18 or less Description: If consults are not indicated, leave blank or document N/A Outcome: Progressing Goal: Nutrition Consult-if indicated Outcome: Progressing   Problem: SCI BOWEL ELIMINATION Goal: RH STG MANAGE BOWEL WITH ASSISTANCE Description: STG Manage Bowel with mod I Assistance. Outcome: Progressing Goal: RH STG SCI MANAGE BOWEL PROGRAM W/ASSIST OR AS APPROPRIATE Description: STG SCI Manage bowel program w/assist or as  appropriate. Outcome: Progressing   Problem: SCI BLADDER ELIMINATION Goal: RH STG MANAGE BLADDER WITH ASSISTANCE Description: STG Manage Bladder With mod I Assistance Outcome: Progressing   Problem: RH SKIN INTEGRITY Goal: RH STG SKIN FREE OF INFECTION/BREAKDOWN Outcome: Progressing Goal: RH STG MAINTAIN SKIN INTEGRITY WITH ASSISTANCE Description: STG Maintain Skin Integrity With mod I Assistance. Outcome: Progressing   Problem: RH SAFETY Goal: RH STG ADHERE TO SAFETY PRECAUTIONS W/ASSISTANCE/DEVICE Description: STG Adhere to Safety Precautions With Assistance/Device. Outcome: Progressing   Problem: RH PAIN MANAGEMENT Goal: RH STG PAIN MANAGED AT OR BELOW PT'S PAIN GOAL Description: Pain scale <4/10 Outcome: Progressing   Problem: RH KNOWLEDGE DEFICIT SCI Goal: RH STG INCREASE KNOWLEDGE OF SELF CARE AFTER SCI Outcome: Progressing

## 2018-12-07 NOTE — Progress Notes (Signed)
[place] PHYSICAL MEDICINE & REHABILITATION PROGRESS NOTE  Subjective/Complaints: Still feels weak, frustrated by bowel incontinence. Legs feel tingly  ROS: Patient denies fever, rash, sore throat, blurred vision, nausea, vomiting, diarrhea, cough, shortness of breath or chest pain,  headache, or mood change.    Objective: Vital Signs: Blood pressure (!) 152/55, pulse 68, temperature 98.2 F (36.8 C), temperature source Oral, resp. rate 16, height 4\' 7"  (1.397 m), weight 56.2 kg, SpO2 98 %. No results found. Recent Labs    12/06/18 0719 12/07/18 0631  WBC 7.3 7.6  HGB 9.5* 10.0*  HCT 30.2* 32.2*  PLT 330 411*   Recent Labs    12/06/18 0719 12/07/18 0631  NA 140 140  K 4.6 4.4  CL 108 107  CO2 24 24  GLUCOSE 110* 111*  BUN 26* 27*  CREATININE 1.48* 1.43*  CALCIUM 9.1 9.4    Physical Exam: BP (!) 152/55 (BP Location: Right Arm)   Pulse 68   Temp 98.2 F (36.8 C) (Oral)   Resp 16   Ht 4\' 7"  (1.397 m)   Wt 56.2 kg   SpO2 98%   BMI 28.80 kg/m  Constitutional: No distress . Vital signs reviewed. HEENT: EOMI, oral membranes moist Neck: supple Cardiovascular: RRR without murmur. No JVD    Respiratory: CTA Bilaterally without wheezes or rales. Normal effort    GI: BS +, non-tender, non-distended  Musc: No edema or tenderness in extremities. Neurological: Alert and oriented Motor: B/l UE 5/5 proximal to distal, stable RLE: Hip flexion, knee extension 4-/5, ankle dorsiflexion 3+/5, stable LLE: HF, KE 3+/5, ADF 1-2/5  Sensation diminished to light touch bilateral LE Skin: Back incision CDI Psychiatric: pleasant.   Assessment/Plan: 1. Functional deficits secondary to cauda equina syndrome which require 3+ hours per day of interdisciplinary therapy in a comprehensive inpatient rehab setting.  Physiatrist is providing close team supervision and 24 hour management of active medical problems listed below.  Physiatrist and rehab team continue to assess barriers to  discharge/monitor patient progress toward functional and medical goals  Care Tool:  Bathing  Bathing activity did not occur: Refused Body parts bathed by patient: Right arm, Left arm, Front perineal area, Chest, Abdomen, Face   Body parts bathed by helper: Buttocks, Right upper leg, Left lower leg, Right lower leg, Left upper leg     Bathing assist Assist Level: Minimal Assistance - Patient > 75%     Upper Body Dressing/Undressing Upper body dressing   What is the patient wearing?: Hospital gown only    Upper body assist Assist Level: Minimal Assistance - Patient > 75%    Lower Body Dressing/Undressing Lower body dressing      What is the patient wearing?: Incontinence brief     Lower body assist Assist for lower body dressing: Maximal Assistance - Patient 25 - 49%     Toileting Toileting Toileting Activity did not occur (Clothing management and hygiene only): N/A (no void or bm)(i&o cath)  Toileting assist Assist for toileting: Moderate Assistance - Patient 50 - 74%     Transfers Chair/bed transfer  Transfers assist     Chair/bed transfer assist level: Moderate Assistance - Patient 50 - 74%     Locomotion Ambulation   Ambulation assist      Assist level: Total Assistance - Patient < 25% Assistive device: Walker-rolling Max distance: 5'   Walk 10 feet activity   Assist  Walk 10 feet activity did not occur: Safety/medical concerns  Walk 50 feet activity   Assist Walk 50 feet with 2 turns activity did not occur: Safety/medical concerns         Walk 150 feet activity   Assist Walk 150 feet activity did not occur: Safety/medical concerns         Walk 10 feet on uneven surface  activity   Assist Walk 10 feet on uneven surfaces activity did not occur: Safety/medical concerns         Wheelchair     Assist Will patient use wheelchair at discharge?: Yes Type of Wheelchair: Manual    Wheelchair assist level:  Supervision/Verbal cueing Max wheelchair distance: 150'    Wheelchair 50 feet with 2 turns activity    Assist        Assist Level: Supervision/Verbal cueing   Wheelchair 150 feet activity     Assist     Assist Level: Supervision/Verbal cueing      Medical Problem List and Plan: 1.  Gait abnormality, weakness, numbness secondary to cauda equina syndrome.  Continue CIR PT, OT 2.  Antithrombotics: -DVT/anticoagulation:  Mechanical: Sequential compression devices, below knee Bilateral lower extremities             -antiplatelet therapy: N/A 3. Pain Management: Oxycodone prn.    Gabapentin decreased to 300 3 times daily on 7/11 given AKI as well as this being more acceptable dosing, tolerating at present  -told her that we couldn't eliminate her numbness 4. Mood: LCSW to follow for evaluation and support.              -antipsychotic agents: N/A 5. Neuropsych: This patient is capable of making decisions on her  own behalf. 6. Skin/Wound Care: Monitor wound daily for healing.  7. Fluids/Electrolytes/Nutrition: Monitor I/O.   8. HTN: Monitor BP bid--continue Nifedipine daily with lopressor bid.    Remains borderline    9. Tachycardia: asymptomatic--monitor HR  tid.    Heart rate within normal is on 7/13  Monitor with increased mobility 10. Urinary retention:   continueVoiding trial   UA equivocal, urine culture negative  11.  Bowel incontinence: seems to be improving  -observe for patterns  Continue bowel meds 12.  AKI  Creatinine 1.48 on 7/12, 1.43 7/13, trending down  encourage PO fluids 13.  Acute blood loss anemia  Hemoglobin 10 on 7.13 after transfusion of 1 unit PRBC on 7/11  LOS: 3 days A FACE TO FACE EVALUATION WAS PERFORMED  Meredith Staggers 12/07/2018, 10:28 AM

## 2018-12-07 NOTE — Progress Notes (Addendum)
Physical Therapy Session Note  Patient Details  Name: Paula Hartman MRN: [id number] Date of Birth: [birth year]  Today's Date: 12/07/2018 PT Individual Time: 5364-6803 and 1300-1330 PT Individual Time Calculation (min): 69 min and 30 min   Short Term Goals: Week 1:  PT Short Term Goal 1 (Week 1): Pt will complete least restrictive transfers with min A PT Short Term Goal 2 (Week 1): Pt will demonstrate recall of 3/3 back precautions PT Short Term Goal 3 (Week 1): Pt will perform bed mobility with min A consistently  Skilled Therapeutic Interventions/Progress Updates:   pt performs supine to sit with min A for log roll technique.  Squat pivot transfers throughout session with assist for LE placement due to sensory deficits, lifting assist and assist with wt shifts.  Pt performs seated balance edge of mat for donning socks with supervision.  Standing balance, tolerance with mod A with wt shifts, alt UE lifts, pre gait stepping with max A.  Attempt mini squats but pt unable to perform safely without buckling.  kinetron performed 3 x 30 reps for bilat LE strengthening.  W/c mobility throughout unit and with obstacle negotiation with supervision.  Ramp training in w/c with supervision to ascend ramp, min A for control when descending ramp.  Pt left in bed with all needs at hand, alarm set.  Session 2: no c/o pain.  Pt performs HEP with use of written and visual cues for supine and seated therex.  Pt performs 3 x 10 heel slides, hip abd/add, adductor squeeze, LAQ with adductor squeeze, HS curl.  AAROM with tactile cues for bilat ankle PF/DF.  Pt left in bed with all needs at hand, alarm set.  Therapy Documentation Precautions:  Precautions Precautions: Back, Fall Precaution Comments: reviewed spinal precautions Required Braces or Orthoses: Spinal Brace Spinal Brace: Thoracolumbosacral orthotic, Applied in sitting position Restrictions Weight Bearing Restrictions: No Pain: Pt with no c/o pain,  states she rec'd pain meds prior to session   Therapy/Group: Individual Therapy  Venice Marcucci 12/07/2018, 9:26 AM

## 2018-12-07 NOTE — IPOC Note (Signed)
Overall Plan of Care [facility]) Patient Details Name: Paula Hartman MRN: [id number] DOB: Dec 18, 1940  Admitting Diagnosis: <principal problem not specified>  Hospital Problems: Active Problems:   Cauda equina syndrome (HCC)   Acute blood loss anemia   AKI (acute kidney injury) ([place])   Benign essential HTN   Labile blood pressure     Functional Problem List: Nursing Bladder, Bowel, Endurance, Motor, Pain  PT Balance, Endurance, Motor, Pain, Safety, Sensory  OT Balance, Behavior, Cognition, Endurance, Motor, Pain, Safety  SLP    TR         Basic ADL's: OT Grooming, Bathing, Dressing, Toileting     Advanced  ADL's: OT       Transfers: PT Bed Mobility, Bed to Chair, Car, Furniture, Floor  OT Toilet, Metallurgist: PT Ambulation, Emergency planning/management officer, Stairs     Additional Impairments: OT None  SLP        TR      Anticipated Outcomes Item Anticipated Outcome  Self Feeding n/a  Swallowing      Basic self-care  supervision w/c level  Toileting  supervision   Bathroom Transfers supervision  Bowel/Bladder  to be continent x2 at time at time of discharge  Transfers  Supervision  Locomotion  Supervision at w/c level  Communication     Cognition     Pain  less than 3/10  Safety/Judgment  to remain fall free while in rehab   Therapy Plan: PT Intensity: Minimum of 1-2 x/day ,45 to 90 minutes PT Frequency: 5 out of 7 days PT Duration Estimated Length of Stay: 14-17 days OT Intensity: Minimum of 1-2 x/day, 45 to 90 minutes OT Frequency: 5 out of 7 days OT Duration/Estimated Length of Stay: 14- 17 days     Due to the current state of emergency, patients may not be receiving their 3-hours of Medicare-mandated therapy.   Team Interventions: Nursing Interventions Patient/Family Education, Disease Management/Prevention, Discharge Planning, Bladder Management, Bowel Management, Pain Management  PT interventions Ambulation/gait training,  Training and development officer, Community reintegration, Discharge planning, Disease management/prevention, DME/adaptive equipment instruction, Functional electrical stimulation, Functional mobility training, Neuromuscular re-education, Pain management, Patient/family education, Psychosocial support, Splinting/orthotics, Stair training, Therapeutic Activities, Therapeutic Exercise, UE/LE Strength taining/ROM, UE/LE Coordination activities, Wheelchair propulsion/positioning  OT Interventions Balance/vestibular training, Self Care/advanced ADL retraining, Functional mobility training, Community reintegration, Wheelchair propulsion/positioning, Discharge planning, Pain management, Therapeutic Activities, Patient/family education, Therapeutic Exercise, DME/adaptive equipment instruction, Psychosocial support, UE/LE Strength taining/ROM  SLP Interventions    TR Interventions    SW/CM Interventions Discharge Planning, Psychosocial Support, Patient/Family Education   Barriers to Discharge MD  Medical stability  Nursing Incontinence    PT Home environment access/layout, Medical stability    OT Other (comments) none known at this time  SLP      SW       Team Discharge Planning: Destination: PT-Home ,OT- Home , SLP-  Projected Follow-up: PT-Home health PT, OT-  Home health OT, 24 hour supervision/assistance, SLP-  Projected Equipment Needs: PT-To be determined, OT- To be determined, SLP-  Equipment Details: PT-TBD pending progress, OT-  Patient/family involved in discharge planning: PT- Patient,  OT-Patient, SLP-   MD ELOS: 14-17 days Medical Rehab Prognosis:  Excellent Assessment: The patient has been admitted for CIR therapies with the diagnosis of cauda equina syndrome. The team will be addressing functional mobility, strength, stamina, balance, safety, adaptive techniques and equipment, self-care, bowel and bladder mgt, patient and caregiver education, NMR, pain control, community reentry. Goals  have been  set at supervision with self-care and mobility at w/c level.   Due to the current state of emergency, patients may not be receiving their 3 hours per day of Medicare-mandated therapy.    Meredith Staggers, MD, FAAPMR      See Team Conference Notes for weekly updates to the plan of care

## 2018-12-07 NOTE — Progress Notes (Signed)
Slept good. No void, at 2045, I & O cath=600cc's. At 0100, voided 100cc's continently, I & O cath=700cc's. No BM this shift, will give supp.per orders in AM.  Complained about dressing on back itching, changed to foam dressing. Incision looks good, steris in place, no drainage or redness.  Left foot drop. Denied need for pain meds. Refused scheduled 2000 dose of  Neurontin. "makes my leg more numb."Paula Hartman

## 2018-12-07 NOTE — Progress Notes (Signed)
Inpatient Rehabilitation  Patient information reviewed and entered into eRehab system by Wretha Laris M. Elias Dennington, M.A., CCC/SLP, PPS Coordinator.  Information including medical coding, functional ability and quality indicators will be reviewed and updated through discharge.    

## 2018-12-08 ENCOUNTER — Inpatient Hospital Stay (HOSPITAL_COMMUNITY): Payer: Medicare Other

## 2018-12-08 ENCOUNTER — Inpatient Hospital Stay (HOSPITAL_COMMUNITY): Payer: Medicare Other | Admitting: Occupational Therapy

## 2018-12-08 MED ORDER — ACETAMINOPHEN 325 MG PO TABS
650.0000 mg | ORAL_TABLET | Freq: Three times a day (TID) | ORAL | Status: DC
  Administered 2018-12-08 – 2018-12-22 (×53): 650 mg via ORAL
  Filled 2018-12-08 (×56): qty 2

## 2018-12-08 MED FILL — Heparin Sodium (Porcine) Inj 1000 Unit/ML: INTRAMUSCULAR | Qty: 30 | Status: AC

## 2018-12-08 MED FILL — Sodium Chloride IV Soln 0.9%: INTRAVENOUS | Qty: 1000 | Status: AC

## 2018-12-08 NOTE — Progress Notes (Signed)
[place] PHYSICAL MEDICINE & REHABILITATION PROGRESS NOTE  Subjective/Complaints: Up in bed. Legs still "numb". Pain seems controlled. Emptied bowels and bladder yesterday  ROS: Patient denies fever, rash, sore throat, blurred vision, nausea, vomiting, diarrhea, cough, shortness of breath or chest pain, joint or back pain, headache, or mood change.   .    Objective: Vital Signs: Blood pressure (!) 159/46, pulse 62, temperature 98.5 F (36.9 C), temperature source Oral, resp. rate 16, height 4\' 7"  (1.397 m), weight 56.2 kg, SpO2 99 %. No results found. Recent Labs    12/06/18 0719 12/07/18 0631  WBC 7.3 7.6  HGB 9.5* 10.0*  HCT 30.2* 32.2*  PLT 330 411*   Recent Labs    12/06/18 0719 12/07/18 0631  NA 140 140  K 4.6 4.4  CL 108 107  CO2 24 24  GLUCOSE 110* 111*  BUN 26* 27*  CREATININE 1.48* 1.43*  CALCIUM 9.1 9.4    Physical Exam: BP (!) 159/46 (BP Location: Left Arm)   Pulse 62   Temp 98.5 F (36.9 C) (Oral)   Resp 16   Ht 4\' 7"  (1.397 m)   Wt 56.2 kg   SpO2 99%   BMI 28.80 kg/m  Constitutional: No distress . Vital signs reviewed. HEENT: EOMI, oral membranes moist Neck: supple Cardiovascular: RRR without murmur. No JVD    Respiratory: CTA Bilaterally without wheezes or rales. Normal effort    GI: BS +, non-tender, non-distended  Musc: No edema or tenderness in extremities. Neurological: Alert and oriented Motor: B/l UE 5/5 proximal to distal, stable RLE: Hip flexion, knee extension 4-/5, ankle dorsiflexion 3+/5, stable LLE: HF, KE 3+/5, ADF 1-2/5  Sensation diminished to light touch bilateral LE Skin: Back incision CDI Psychiatric: pleasant.   Assessment/Plan: 1. Functional deficits secondary to cauda equina syndrome which require 3+ hours per day of interdisciplinary therapy in a comprehensive inpatient rehab setting.  Physiatrist is providing close team supervision and 24 hour management of active medical problems listed below.  Physiatrist  and rehab team continue to assess barriers to discharge/monitor patient progress toward functional and medical goals  Care Tool:  Bathing  Bathing activity did not occur: Refused Body parts bathed by patient: Right arm, Left arm, Front perineal area, Chest, Abdomen, Face   Body parts bathed by helper: Buttocks, Right upper leg, Left lower leg, Right lower leg, Left upper leg     Bathing assist Assist Level: Minimal Assistance - Patient > 75%     Upper Body Dressing/Undressing Upper body dressing   What is the patient wearing?: Hospital gown only    Upper body assist Assist Level: Minimal Assistance - Patient > 75%    Lower Body Dressing/Undressing Lower body dressing      What is the patient wearing?: Incontinence brief     Lower body assist Assist for lower body dressing: Maximal Assistance - Patient 25 - 49%     Toileting Toileting Toileting Activity did not occur (Clothing management and hygiene only): N/A (no void or bm)(i&o cath)  Toileting assist Assist for toileting: Moderate Assistance - Patient 50 - 74%     Transfers Chair/bed transfer  Transfers assist     Chair/bed transfer assist level: Moderate Assistance - Patient 50 - 74%     Locomotion Ambulation   Ambulation assist      Assist level: Total Assistance - Patient < 25% Assistive device: Walker-rolling Max distance: 5'   Walk 10 feet activity   Assist  Walk 10 feet activity  did not occur: Safety/medical concerns        Walk 50 feet activity   Assist Walk 50 feet with 2 turns activity did not occur: Safety/medical concerns         Walk 150 feet activity   Assist Walk 150 feet activity did not occur: Safety/medical concerns         Walk 10 feet on uneven surface  activity   Assist Walk 10 feet on uneven surfaces activity did not occur: Safety/medical concerns         Wheelchair     Assist Will patient use wheelchair at discharge?: Yes Type of Wheelchair:  Manual    Wheelchair assist level: Supervision/Verbal cueing Max wheelchair distance: 150'    Wheelchair 50 feet with 2 turns activity    Assist        Assist Level: Supervision/Verbal cueing   Wheelchair 150 feet activity     Assist     Assist Level: Supervision/Verbal cueing      Medical Problem List and Plan: 1.  Gait abnormality, weakness, numbness secondary to cauda equina syndrome.  Continue CIR PT, OT  --Interdisciplinary Team Conference today   2.  Antithrombotics: -DVT/anticoagulation:  Mechanical: Sequential compression devices, below knee Bilateral lower extremities             -antiplatelet therapy: N/A 3. Pain Management: Oxycodone prn.    Gabapentin decreased to 300 3 times daily on 7/11 given AKI as well as this being more acceptable dosing, tolerating at present  -discussed potential for improvement of sensory loss 4. Mood: LCSW to follow for evaluation and support.              -antipsychotic agents: N/A 5. Neuropsych: This patient is capable of making decisions on her  own behalf. 6. Skin/Wound Care: Monitor wound daily for healing.  7. Fluids/Electrolytes/Nutrition: Monitor I/O.   8. HTN: Monitor BP bid--continue Nifedipine daily with lopressor bid.    Remains borderline    9. Tachycardia: asymptomatic--monitor HR  tid.    Heart rate within normal is on 7/14  Monitor with increased mobility 10. Urinary retention:   continueVoiding trial   UA equivocal, urine culture negative   -seems to be emptying bladder more completely 11.  Bowel incontinence: seems to be improving  -observe for patterns  Continue bowel meds 12.  AKI  Creatinine 1.48 on 7/12, 1.43 7/13, trending down  Continue to encourage PO fluids 13.  Acute blood loss anemia  Hemoglobin 10 on 7.13 after transfusion of 1 unit PRBC on 7/11  LOS: 4 days A FACE TO FACE EVALUATION WAS PERFORMED  Meredith Staggers 12/08/2018, 9:16 AM

## 2018-12-08 NOTE — Progress Notes (Signed)
Physical Therapy Session Note  Patient Details  Name: Paula Hartman MRN: [id number] Date of Birth: [birth year]  Today's Date: 12/08/2018 PT Individual Time: 0802-0900 and 1401-1500 PT Individual Time Calculation (min): 58 min and 59 minutes   Short Term Goals: Week 1:  PT Short Term Goal 1 (Week 1): Pt will complete least restrictive transfers with min A PT Short Term Goal 2 (Week 1): Pt will demonstrate recall of 3/3 back precautions PT Short Term Goal 3 (Week 1): Pt will perform bed mobility with min A consistently  Skilled Therapeutic Interventions/Progress Updates:    Session 1: Pt supine in bed upon PT arrival, agreeable to therapy tx and denies pain. Pt transferred to sitting EOB with min assist and performed squat pivot to w/c with mod assist. Pt propelled w/c to the sink and brushed teeth, brushed hair with supervision. Pt propelled w/c to the gym x 150 ft with supervision using B UEs. Squat pivot to the mat with mod assist. Pt performed sit<>stand with RW and min assist, pt worked on standing balance and pre-gait to perform stepping in place with each LE, min-mod assist. Pt worked on static standing balance without UE support, knee hyperextension noted bilaterally. Pt worked on knee control to perform x 5 mini squats without UE support, cues for eccentric control. Pt transferred to supine with min assist, in supine pt performed LE therex for strengthening and neuro re-ed, 2 x 10 each with cues for techniques: bridges (with facilitation, unable to fully clear hips), SAQ 3# ankle weight, hip abduction with theraband, straight leg raises (active assisted needed on L), heel slides and PNF D2 extension against manual resistance. Pt transferred to sitting with min assist and cues for back precautions. Pt worked on stance control/knee control while performing toe taps on aerobic step, UE support on RW and therapist blocking knee hyperextension on stance leg. Pt worked on LE strength and knee  control to perform x 3 sit<>stands without AD and mod assist, in standing emphasis on maintaining slight knee flexion bilaterally to limit knee hyperextension. Pt transferred back to w/c with mod assist and propelled back to room, left seated in w/c with needs in reach and chair alarm set.    Session 2:  Pt supine in bed upon PT arrival, agreeable to therapy tx and denies pain at rest. Pt transferred to sitting EOB and performed squat pivot mod assist to w/c. Pt transferred to the therapy mat with mod assist squat pivot. Therapist donned L GRAFO and donned R ace wrap for DF assist, pt worked on pre-gait and standing with RW and min assist. Pt performed stand pivot to the w/c with RW and mod assist, cues for techniques and foot placement. Pt ambulated x 16 ft and x 8 ft with RW and mod assist +2 for w/c follow for safety, therapist providing cues for foot placement and RW management, therapist blocking R knee hyperextension, pt with bilateral genu recurvatum during stance. Stand pivot back to mat with RW and mod assist, cues for techniques. Using mirror for visual feedback worked on maintaining slight knee flexion in static standing without UE support, mod assist with max cues for techniques. Pt worked on Conservation officer, nature with RW and Geologist, engineering for feedback, therapist blocking knee hyperextension during stance. Stand pivot back to w/c with RW and mod assist, transported back to room. Pt transferred to bed squat pivot with min assist and transferred to supine. Pt left with needs in reach and bed alarm set.   Therapy Documentation Precautions:  Precautions Precautions: Back, Fall Precaution Comments: reviewed spinal precautions Required Braces or Orthoses: Spinal Brace Spinal Brace: Thoracolumbosacral orthotic Restrictions Weight Bearing Restrictions: No    Therapy/Group: Individual Therapy  Netta Corrigan, PT, DPT 12/08/2018, 7:53 AM

## 2018-12-08 NOTE — Plan of Care (Signed)
  Problem: Consults Goal: RH GENERAL PATIENT EDUCATION Description: See Patient Education module for education specifics. Outcome: Progressing Goal: Skin Care Protocol Initiated - if Braden Score 18 or less Description: If consults are not indicated, leave blank or document N/A Outcome: Progressing   Problem: RH BOWEL ELIMINATION Goal: RH STG MANAGE BOWEL WITH ASSISTANCE Description: STG Manage Bowel with mod I Assistance. Outcome: Progressing   Problem: RH BLADDER ELIMINATION Goal: RH STG MANAGE BLADDER WITH ASSISTANCE Description: STG Manage Bladder With mod I Assistance Outcome: Progressing   Problem: RH SKIN INTEGRITY Goal: RH STG SKIN FREE OF INFECTION/BREAKDOWN Outcome: Progressing Goal: RH STG MAINTAIN SKIN INTEGRITY WITH ASSISTANCE Description: STG Maintain Skin Integrity With mod I Assistance. Outcome: Progressing   Problem: RH SAFETY Goal: RH STG ADHERE TO SAFETY PRECAUTIONS W/ASSISTANCE/DEVICE Description: STG Adhere to Safety Precautions With Assistance/Device. Outcome: Progressing   Problem: RH PAIN MANAGEMENT Goal: RH STG PAIN MANAGED AT OR BELOW PT'S PAIN GOAL Outcome: Progressing   Problem: RH KNOWLEDGE DEFICIT GENERAL Goal: RH STG INCREASE KNOWLEDGE OF SELF CARE AFTER HOSPITALIZATION Outcome: Progressing   Problem: Consults Goal: RH SPINAL CORD INJURY PATIENT EDUCATION Description:  See Patient Education module for education specifics.  Outcome: Progressing Goal: Skin Care Protocol Initiated - if Braden Score 18 or less Description: If consults are not indicated, leave blank or document N/A Outcome: Progressing Goal: Nutrition Consult-if indicated Outcome: Progressing   Problem: SCI BOWEL ELIMINATION Goal: RH STG MANAGE BOWEL WITH ASSISTANCE Description: STG Manage Bowel with mod I Assistance. Outcome: Progressing Goal: RH STG SCI MANAGE BOWEL PROGRAM W/ASSIST OR AS APPROPRIATE Description: STG SCI Manage bowel program w/assist or as  appropriate. Outcome: Progressing   Problem: SCI BLADDER ELIMINATION Goal: RH STG MANAGE BLADDER WITH ASSISTANCE Description: STG Manage Bladder With mod I Assistance Outcome: Progressing   Problem: RH SKIN INTEGRITY Goal: RH STG SKIN FREE OF INFECTION/BREAKDOWN Outcome: Progressing Goal: RH STG MAINTAIN SKIN INTEGRITY WITH ASSISTANCE Description: STG Maintain Skin Integrity With mod I Assistance. Outcome: Progressing   Problem: RH SAFETY Goal: RH STG ADHERE TO SAFETY PRECAUTIONS W/ASSISTANCE/DEVICE Description: STG Adhere to Safety Precautions With Assistance/Device. Outcome: Progressing   Problem: RH PAIN MANAGEMENT Goal: RH STG PAIN MANAGED AT OR BELOW PT'S PAIN GOAL Description: Pain scale <4/10 Outcome: Progressing   Problem: RH KNOWLEDGE DEFICIT SCI Goal: RH STG INCREASE KNOWLEDGE OF SELF CARE AFTER SCI Outcome: Progressing

## 2018-12-08 NOTE — Progress Notes (Signed)
Occupational Therapy Session Note  Patient Details  Name: Paula Hartman MRN: [id number] Date of Birth: 1941-01-12  Today's Date: 12/08/2018 OT Individual Time: 0945-1100 OT Individual Time Calculation (min): 75 min    Short Term Goals: Week 1:  OT Short Term Goal 1 (Week 1): Pt will perform toilet transfer at mod A consistently. OT Short Term Goal 2 (Week 1): Pt will perform LB dressing at mod A overall. OT Short Term Goal 3 (Week 1): Pt will perform toileting at mod A overall. Week 2:     Skilled Therapeutic Interventions/Progress Updates:    1:1 Self care retraining at shower level. Pt transferred into w/c with min A and then transferred into shower stall to [facility] with min A. Sit to stand to removed LB clothing with A. Then doffed shirt and TLSO. Pt able to bathe in sitting position with setup. Donned shirt and TLSO in the shower and transferred back into w/c. Pt again still requires A to pull up pants while standing with UE support.  Pt propelled to the gym and transferred to the mat with min guard- continued to practiced hand placement for optimal transfer method. On mat in supine performed LB and core strengthening and coordination activities. Transitioned to sit to stands with facilitation for knee control with transitional movements.   Pt transferred into recliner with min A and left resting with feet up.   Therapy Documentation Precautions:  Precautions Precautions: Back, Fall Precaution Comments: reviewed spinal precautions Required Braces or Orthoses: Spinal Brace Spinal Brace: Thoracolumbosacral orthotic Restrictions Weight Bearing Restrictions: No General:   Vital Signs: Therapy Vitals Temp: 98.4 F (36.9 C) Temp Source: Oral Pulse Rate: 64 Resp: 17 BP: (!) 112/95 Patient Position (if appropriate): Lying Oxygen Therapy SpO2: 97 % O2 Device: Room Air Pain:  no c/o pain   Therapy/Group: Individual Therapy  Willeen Cass [facility] 12/08/2018, 3:20 PM

## 2018-12-09 ENCOUNTER — Inpatient Hospital Stay (HOSPITAL_COMMUNITY): Payer: Medicare Other | Admitting: Occupational Therapy

## 2018-12-09 ENCOUNTER — Inpatient Hospital Stay (HOSPITAL_COMMUNITY): Payer: Medicare Other

## 2018-12-09 ENCOUNTER — Inpatient Hospital Stay (HOSPITAL_COMMUNITY): Payer: Medicare Other | Admitting: Physical Therapy

## 2018-12-09 LAB — OCCULT BLOOD X 1 CARD TO LAB, STOOL
Fecal Occult Bld: NEGATIVE
Fecal Occult Bld: NEGATIVE

## 2018-12-09 NOTE — Patient Care Conference (Signed)
Inpatient RehabilitationTeam Conference and Plan of Care Update Date: 12/08/2018   Time: 2:05 PM    Patient Name: Paula Hartman      Medical Record Number: [id number]  Date of Birth: Oct 31, 1940 Sex: Female         Room/Bed: 4W17C/4W17C-01 Payor Info: Payor: MEDICARE / Plan: MEDICARE PART A AND B / Product Type: *No Product type* /    Admitting Diagnosis: 1. SCI Team  Other NTSC dysf; Cauda equine syndrome; 15-17days  Admit Date/Time:  12/04/2018  4:42 PM Admission Comments: No comment available   Primary Diagnosis:  <principal problem not specified> Principal Problem: <principal problem not specified>  Patient Active Problem List   Diagnosis Date Noted  . Labile blood pressure   . Acute blood loss anemia   . AKI (acute kidney injury) ([place])   . Benign essential HTN   . Cauda equina syndrome ([place]) 12/04/2018  . Lumbar disc herniation   . Incontinence of feces   . Urinary retention   . Sinus tachycardia   . Neuropathic pain   . Postoperative pain   . Essential hypertension   . Dyslipidemia   . Radiculopathy 12/02/2018  . ESSENTIAL HYPERTENSION 12/28/2009  . Allergic rhinitis 12/28/2009  . 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 RENAL INSUFFICIENCY, CHRONIC 12/28/2009    Expected Discharge Date: Expected Discharge Date: 12/22/18  Team Members Present: Physician leading conference: Dr. Alger Simons Social Worker Present: Alfonse Alpers, LCSW Nurse Present: Mohammed Kindle, RN PT Present: Excell Seltzer, PT OT Present: Amy Rounds, OT SLP Present: Weston Anna, SLP PPS Coordinator present : Gunnar Fusi, SLP     Current Status/Progress Goal Weekly Team Focus  Medical   cauda equina syndrome s/p decompression. beginning to empy bowels and bladder.pain better. still a lof sensory loss in legs.  improve functional mobility,  bowel and bladder mgt, education   Bowel/Bladder   Continent of bowel and bladder. PVR q4-6 hours.  Remain continent of bowel and bladder.  Continue tolieting patient.  Assess tolieting needs qshift.   Swallow/Nutrition/ Hydration             ADL's   setup for bathing in seated position in shower, transfers min A , sit to stands with ADL with min A, standing min A with UE support, A needed to pull up pants in standing position  supervision overall  transfers to/ from BSC/ toileting, clothing management, family education,   Mobility   mod A transfers and mobility  min A gait with PT only, supervision transfers and w/c mobility  LE strength and coordination, transfers   Communication             Safety/Cognition/ Behavioral Observations            Pain   7/10 mid/lower back pain. Tylenol 650mg  given as ordered  2/10  assess pain q shift. Medicate PRN as ordered   Skin   Surgical incision to lower back. Steri strips in place with foam dressing. Dressing intact dry no drainage or s/s of infection noted.  Remain intact, free from infection and breakdown  assess surgical incision qshift/prn    Rehab Goals Patient on target to meet rehab goals: Yes Rehab Goals Revised: none *See Care Plan and progress notes for long and short-term goals.     Barriers to Discharge  Current Status/Progress Possible Resolutions Date Resolved   Physician    Medical stability               Nursing  PT  Home environment access/layout;Medical stability                 OT Other (comments)  none known at this time             SLP                SW                Discharge Planning/Teaching Needs:  Pt plans to return to her dtr's home where she was living prior to admission.  She has a flight of stairs to her bedroom, so she knows she may need to stay downstairs for a while.  Pt's dtr can attend family education prior to d/c, as needed.   Team Discussion:  Pt with cauda equina syndrome, sensory deficits, and some pain.  Pt is mainly bothered by numbness.  Dr. Naaman Plummer is watching kidney function and anemia.  Her bowels and  bladder are working.  Pt is refusing bowel program, but did stool on her own.  Pt has not required cathing.  Pt now has scheduled tylenol.  Pt is mod A for squat pivot txs and sit to stand.  Her LLE buckles when up and pt will need a ramp, as she will not be able to do stairs.  Pt's goals are supervision at w/c level.  Revisions to Treatment Plan:  none    Continued Need for Acute Rehabilitation Level of Care: The patient requires daily medical management by a physician with specialized training in physical medicine and rehabilitation for the following conditions: Daily direction of a multidisciplinary physical rehabilitation program to ensure safe treatment while eliciting the highest outcome that is of practical value to the patient.: Yes Daily medical management of patient stability for increased activity during participation in an intensive rehabilitation regime.: Yes Daily analysis of laboratory values and/or radiology reports with any subsequent need for medication adjustment of medical intervention for : Post surgical problems;Neurological problems;Urological problems   I attest that I was present, lead the team conference, and concur with the assessment and plan of the team.Team conference was held via web/ teleconference due to [place] - 19.   Amado Andal, Silvestre Mesi 12/09/2018, 9:53 AM

## 2018-12-09 NOTE — Progress Notes (Signed)
Occupational Therapy Session Note  Patient Details  Name: Paula Hartman MRN: [id number] Date of Birth: 01/19/1941  Today's Date: 12/09/2018 OT Individual Time: 0951-1100 OT Individual Time Calculation (min): 69 min    Short Term Goals: Week 1:  OT Short Term Goal 1 (Week 1): Pt will perform toilet transfer at mod A consistently. OT Short Term Goal 2 (Week 1): Pt will perform LB dressing at mod A overall. OT Short Term Goal 3 (Week 1): Pt will perform toileting at mod A overall.  Skilled Therapeutic Interventions/Progress Updates:    Pt received supine worried about amount of therapy today. Discussed rest breaks and importance of letting therapist know fatigue levels. Pt completed bed mobility to EOB with (S) good use of log rolling technique. Pt donned TLSO with min A. Pt completed stand pivot transfer with min A to w/c, with cueing for hand placement. Pt completed another SPT into shower onto bari BSC with min A. Pt doffed pants sit <> stand with min A. Pt then doffed TlSO and shirt with (S). Pt washed UB, LB and hair from seated position with (S). Pt donned TLSO and shirt with (S) following shower. Pt completed stand pivot transfer to w/c with min A. From w/c pt donned underwear and pants with min A for standing balance support only. Pt reported her L leg gives her more trouble with lifting than R, so encouraged her to thread that leg first in pants. Pt required as much as mod A for dynamic standing balance support when removing 1 UE from sink to pull pants up. Pt completed oral care and grooming tasks at sink with set up assistance. Pt propelled w/c to therapy gym with min cueing for technique. Pt transferred to therapy mat with mod A required during several small steps to the mat, with leg advancement required. Pt completed blocked practice of sit <> stand with focus on quad control to avoid hyperextension. Pt also completed BUE strengthening circuit holding a 1 kg medicine ball. Pt then was  positioned in front of one parallel bar and she completed mini squats with half ROM to avoid hyperextension compensation. Pt returned to her room and was left sitting up in the recliner with all needs met.   Therapy Documentation Precautions:  Precautions Precautions: Back, Fall Precaution Comments: reviewed spinal precautions Required Braces or Orthoses: Spinal Brace Spinal Brace: Thoracolumbosacral orthotic Restrictions Weight Bearing Restrictions: No   Therapy/Group: Individual Therapy  Curtis Sites 12/09/2018, 7:15 AM
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
## 2018-12-09 NOTE — Progress Notes (Signed)
Inpatient Rehabilitation Center Individual Statement of Services  Patient Name:  Paula Hartman  Date:  12/09/2018  Welcome to the [facility].  Our goal is to provide you with an individualized program based on your diagnosis and situation, designed to meet your specific needs.  With this comprehensive rehabilitation program, you will be expected to participate in at least 3 hours of rehabilitation therapies Monday-Friday, with modified therapy programming on the weekends.  Your rehabilitation program will include the following services:  Physical Therapy (PT), Occupational Therapy (OT), 24 hour per day rehabilitation nursing, Case Management (Social Worker), Rehabilitation Medicine, Nutrition Services and Pharmacy Services  Weekly team conferences will be held on Tuesdays to discuss your progress.  Your Social Worker will talk with you frequently to get your input and to update you on team discussions.  Team conferences with you and your family in attendance may also be held.  Expected length of stay:  14 to 17 days  Overall anticipated outcome:  Supervision at wheelchair level with minimal assistance for car transfers  Depending on your progress and recovery, your program may change. Your Social Worker will coordinate services and will keep you informed of any changes. Your Social Worker's name and contact numbers are listed  below.  The following services may also be recommended but are not provided by the [facility] will be made to provide these services after discharge if needed.  Arrangements include referral to agencies that provide these services.  Your insurance has been verified to be:  Medicare and Tricare Your primary doctor is:  Dr. Lujean Amel  Pertinent information will be shared with your doctor and your insurance  company.  Social Worker:  Alfonse Alpers, LCSW  [phone] or ([phone]  Information discussed with and copy given to patient by: Trey Sailors, 12/09/2018, 12:49 PM

## 2018-12-09 NOTE — Progress Notes (Signed)
.   Physical Therapy Session Note  Patient Details  Name: Paula Hartman MRN: [id number] Date of Birth: [birth year]  Today's Date: 12/09/2018 PT Individual Time: 2171-1654 PT Individual Time Calculation (min): 55 min   Short Term Goals: Week 1:  PT Short Term Goal 1 (Week 1): Pt will complete least restrictive transfers with min A PT Short Term Goal 2 (Week 1): Pt will demonstrate recall of 3/3 back precautions PT Short Term Goal 3 (Week 1): Pt will perform bed mobility with min A consistently Week 2:     Skilled Therapeutic Interventions/Progress Updates:   Pt received sitting in WC and agreeable to PT. Donning shoes sitting in WC with min assist for safety. WC mobility 2 x 143f with supervision assist and min cues for safety in tight spaces of room.    Squat pivot transfer to nustep with min assist and min cues for set up and sequencing. Nustep reciprocal movement training x 5 min  + 3 min level 5 with cues for improved hip and knee alignment on the L   [facility] for safety. BLE maintained in flexed position. Gait training with RW x 6 ft with RW only, severe GR in the LLE. PT applied LAFO and performed gait training x 143fand 1538fith mod assist from PT and RW. Mild improvement in GR with AFO as wells cues for decreased step length.   Following gait training, pt reports severe fatigue and requesting to return to room.Pt returned to room and performed stand pivot transfer to bed with RW and min assist for safety. PT assisted pt to doff shoes and LAFO. Sit>supine completed with supervision assist, and left supine in bed with call bell in reach and all needs met.         Therapy Documentation Precautions:  Precautions Precautions: Back, Fall Precaution Comments: reviewed spinal precautions Required Braces or Orthoses: Spinal Brace Spinal Brace: Thoracolumbosacral orthotic Restrictions Weight Bearing Restrictions:  No Pain: denies   Therapy/Group: Individual Therapy  AusLorie Phenix15/2020, 11:35 PM

## 2018-12-09 NOTE — Progress Notes (Signed)
[place] PHYSICAL MEDICINE & REHABILITATION PROGRESS NOTE  Subjective/Complaints: Feels generally sore in back and legs. Took tylenol last night which help, but was a little restless last night as a result  ROS: Patient denies fever, rash, sore throat, blurred vision, nausea, vomiting, diarrhea, cough, shortness of breath or chest pain,   headache, or mood change.  .    Objective: Vital Signs: Blood pressure (!) 152/58, pulse 61, temperature 98 F (36.7 C), temperature source Oral, resp. rate 18, height 4\' 7"  (1.397 m), weight 56.3 kg, SpO2 99 %. No results found. Recent Labs    12/07/18 0631  WBC 7.6  HGB 10.0*  HCT 32.2*  PLT 411*   Recent Labs    12/07/18 0631  NA 140  K 4.4  CL 107  CO2 24  GLUCOSE 111*  BUN 27*  CREATININE 1.43*  CALCIUM 9.4    Physical Exam: BP (!) 152/58 (BP Location: Left Arm)   Pulse 61   Temp 98 F (36.7 C) (Oral)   Resp 18   Ht 4\' 7"  (1.397 m)   Wt 56.3 kg   SpO2 99%   BMI 28.86 kg/m  Constitutional: No distress . Vital signs reviewed. HEENT: EOMI, oral membranes moist Neck: supple Cardiovascular: RRR without murmur. No JVD    Respiratory: CTA Bilaterally without wheezes or rales. Normal effort    GI: BS +, non-tender, non-distended  Musc: No edema or tenderness in extremities. Neurological: Alert and oriented Motor: B/l UE 5/5 proximal to distal, stable RLE: Hip flexion, knee extension 4-/5, ankle dorsiflexion 3+/5, stable LLE: HF, KE 3+/5, ADF 1-2/5  Sensation diminished to light touch bilateral LE--stable Skin: Back incision CDI, glued Psychiatric: pleasant.   Assessment/Plan: 1. Functional deficits secondary to cauda equina syndrome which require 3+ hours per day of interdisciplinary therapy in a comprehensive inpatient rehab setting.  Physiatrist is providing close team supervision and 24 hour management of active medical problems listed below.  Physiatrist and rehab team continue to assess barriers to  discharge/monitor patient progress toward functional and medical goals  Care Tool:  Bathing  Bathing activity did not occur: Refused Body parts bathed by patient: Right arm, Left arm, Front perineal area, Chest, Abdomen, Face, Left upper leg, Right lower leg, Left lower leg, Buttocks, Right upper leg   Body parts bathed by helper: Buttocks, Right upper leg, Left lower leg, Right lower leg, Left upper leg     Bathing assist Assist Level: Set up assist     Upper Body Dressing/Undressing Upper body dressing   What is the patient wearing?: Pull over shirt, Orthosis    Upper body assist Assist Level: Set up assist    Lower Body Dressing/Undressing Lower body dressing      What is the patient wearing?: Underwear/pull up, Pants     Lower body assist Assist for lower body dressing: Minimal Assistance - Patient > 75%     Toileting Toileting Toileting Activity did not occur (Clothing management and hygiene only): N/A (no void or bm)(i&o cath)  Toileting assist Assist for toileting: Moderate Assistance - Patient 50 - 74%     Transfers Chair/bed transfer  Transfers assist     Chair/bed transfer assist level: Minimal Assistance - Patient > 75%     Locomotion Ambulation   Ambulation assist      Assist level: 2 helpers(mod assist +2 for w/c follow) Assistive device: Walker-rolling Max distance: 16 ft   Walk 10 feet activity   Assist  Walk 10 feet activity  did not occur: Safety/medical concerns  Assist level: 2 helpers Assistive device: Walker-rolling   Walk 50 feet activity   Assist Walk 50 feet with 2 turns activity did not occur: Safety/medical concerns         Walk 150 feet activity   Assist Walk 150 feet activity did not occur: Safety/medical concerns         Walk 10 feet on uneven surface  activity   Assist Walk 10 feet on uneven surfaces activity did not occur: Safety/medical concerns         Wheelchair     Assist Will patient  use wheelchair at discharge?: Yes Type of Wheelchair: Manual    Wheelchair assist level: Supervision/Verbal cueing Max wheelchair distance: 150'    Wheelchair 50 feet with 2 turns activity    Assist        Assist Level: Supervision/Verbal cueing   Wheelchair 150 feet activity     Assist     Assist Level: Supervision/Verbal cueing      Medical Problem List and Plan: 1.  Gait abnormality, weakness, numbness secondary to cauda equina syndrome.  Continue CIR PT, OT      2.  Antithrombotics: -DVT/anticoagulation:  Mechanical: Sequential compression devices, below knee Bilateral lower extremities             -antiplatelet therapy: N/A 3. Pain Management: Oxycodone prn.    Gabapentin decreased to 300 3 times daily on 7/11 given AKI as well as this being more acceptable dosing   -encourage use of tylenol, heat  -have reviewed potential for improvement of sensory loss 4. Mood: LCSW to follow for evaluation and support.              -antipsychotic agents: N/A 5. Neuropsych: This patient is capable of making decisions on her  own behalf. 6. Skin/Wound Care: Monitor wound daily for healing.  7. Fluids/Electrolytes/Nutrition: Monitor I/O.   8. HTN: Monitor BP bid--continue Nifedipine daily with lopressor bid.    Fair to borderline    9. Tachycardia: asymptomatic--monitor HR  tid.    Heart rate within normal is on 7/15  Monitor with increased mobility 10. Urinary retention:   continueVoiding trial   UA equivocal, urine culture negative   -  emptying bladder more completely 11.  Bowel incontinence: seems to be improving  -observe for patterns  Continue bowel meds 12.  AKI  Creatinine 1.48 on 7/12, 1.43 7/13, trending down  Continue to encourage PO fluids--recheck next week 13.  Acute blood loss anemia  Hemoglobin 10 on 7.13 after transfusion of 1 unit PRBC on 7/11  LOS: 5 days A FACE TO FACE EVALUATION WAS PERFORMED  Meredith Staggers 12/09/2018, 8:58 AM

## 2018-12-09 NOTE — Progress Notes (Signed)
Occupational Therapy Session Note  Patient Details  Name: Paula Hartman MRN: [id number] Date of Birth: [birth year]  Today's Date: 12/09/2018 OT Individual Time: 1300-1415 OT Individual Time Calculation (min): 75 min    Short Term Goals: Week 1:  OT Short Term Goal 1 (Week 1): Pt will perform toilet transfer at mod A consistently. OT Short Term Goal 2 (Week 1): Pt will perform LB dressing at mod A overall. OT Short Term Goal 3 (Week 1): Pt will perform toileting at mod A overall.  Skilled Therapeutic Interventions/Progress Updates:    Pt seen for OT session focusing on functional transfers and standing balance/endurance and LE control. Pt in supine upon arrival, agreeable to tx session. Complaints of overall soreness from earlier therapy sessions, however, denied need for intervention. Throughout session, completed log rolling/bed mobility mod I to come into sitting EOB. TLSO donned with assist. She compelted stand pivot transfer with RW, min A to power into standing and min-mod A for pivot portion with assist to manage RW.  She self propelled w/c throughout unit with supervision, VCs for effective w/c propulsion techniques.  IN ADL apartment, completed w/<> low soft surface couch via squat pivot with min A with VCs for hand placement and technique.  Education/demonstration provided regarding tub transfer bench and transfer technique. Pt return demonstrated with min A squat pivot transfer to tub bench and completed remainder of transfer with supervision. She voiced beleiving this would work at home.  She returned to w/c and taken to therapy gym. Completed dynamic standing activity standing at RW, tapping foot on target for R and L LE. Pt with increased control R>L to hit target. Noted to hyperextend L LE, manual assist provided by therapist to facilitate neutral positioning at knee. Completed x 5 [name] with rest breaks provided btwn trials. She then donned socks and shoes and trialed activity  again with improved control due to increased proprioceptive input. She returned to supine on mat. Completed the following exercises. UE exercises completed using #2 dowel rod.   "Clam Shells" x12 on R and L  x2 sets of 12 chest press  x2 sets of 12 overhead press  x2 sets fo 12 bicep curls  VCs provided for proper form/technique to ensure maintaining of spinal pre-cautions.  Pt returned to room at end of session, left sitting up in w/c at end of session with all needs in reach.     Therapy Documentation Precautions:  Precautions Precautions: Back, Fall Precaution Comments: reviewed spinal precautions Required Braces or Orthoses: Spinal Brace Spinal Brace: Thoracolumbosacral orthotic Restrictions Weight Bearing Restrictions: No   Therapy/Group: Individual Therapy  Geordie Nooney L 12/09/2018, 7:06 AM

## 2018-12-09 NOTE — Progress Notes (Signed)
Occupational Therapy Session Note  Patient Details  Name: Paula Hartman MRN: [id number] Date of Birth: 1941-03-23  Today's Date: 12/09/2018 OT Individual Time: 4782-9562 OT Individual Time Calculation (min): 27 min    Short Term Goals: Week 1:  OT Short Term Goal 1 (Week 1): Pt will perform toilet transfer at mod A consistently. OT Short Term Goal 2 (Week 1): Pt will perform LB dressing at mod A overall. OT Short Term Goal 3 (Week 1): Pt will perform toileting at mod A overall.  Skilled Therapeutic Interventions/Progress Updates:    1:1. Pt received in bed agreeale to OT but has back pain. Rn unable to deliver meducation as it is too early. Pt agreeable to work on footwear. OT demo shoe funnel to don L shoe and demo as well with AFO. Pt able to don L shoe without AFO, but declines trialing with AFO. Pt wants purchasing info for shoe funnel and OT provides handout. Exited session with pt seated in w/c, call light in reach and exit alarm on  Therapy Documentation Precautions:  Precautions Precautions: Back, Fall Precaution Comments: reviewed spinal precautions Required Braces or Orthoses: Spinal Brace Spinal Brace: Thoracolumbosacral orthotic Restrictions Weight Bearing Restrictions: No General:   Vital Signs: Therapy Vitals Temp: 98.2 F (36.8 C) Pulse Rate: 62 Resp: 17 BP: (!) 122/56 Patient Position (if appropriate): Lying Oxygen Therapy SpO2: 99 % O2 Device: Room Air Pain:   ADL:   Vision   Perception    Praxis   Exercises:   Other Treatments:     Therapy/Group: Individual Therapy  Tonny Branch 12/09/2018, 3:15 PM

## 2018-12-09 NOTE — Progress Notes (Signed)
Social Work Patient ID: Paula Hartman, female   DOB: 03-20-1941, 78 y.o.   MRN: [id number]   CSW met with pt and later called her dtr to update them on team conference discussion and targeted d/c date of 12-22-18.  Pt and dtr would like for pt to be able to go upstairs to her room, but explained that pt would not be able to do stairs.  Also, told her that therapy team recommended a ramp for pt.  Dtr asked if her husband could carry pt up the stairs, but CSW reminded her of pt's back precautions and what would happen if there was an emergency and he wasn't available.  She had not thought of that.  Dtr will talk with her husband about the above and CSW will call dtr tomorrow to f/u with her.  CSW will continue to follow and assist as needed.

## 2018-12-10 ENCOUNTER — Inpatient Hospital Stay (HOSPITAL_COMMUNITY): Payer: Medicare Other | Admitting: Physical Therapy

## 2018-12-10 ENCOUNTER — Inpatient Hospital Stay (HOSPITAL_COMMUNITY): Payer: Medicare Other

## 2018-12-10 MED ORDER — MUSCLE RUB 10-15 % EX CREA
TOPICAL_CREAM | Freq: Three times a day (TID) | CUTANEOUS | Status: DC
  Administered 2018-12-10 – 2018-12-21 (×32): via TOPICAL
  Filled 2018-12-10: qty 85

## 2018-12-10 MED ORDER — TROLAMINE SALICYLATE 10 % EX CREA: TOPICAL_CREAM | Freq: Three times a day (TID) | CUTANEOUS | Status: DC

## 2018-12-10 NOTE — Progress Notes (Signed)
Physical Therapy Session Note  Patient Details  Name: Paula Hartman MRN: [id number] Date of Birth: 1940/09/14  Today's Date: 12/10/2018 PT Individual Time: 9211-9417 PT Individual Time Calculation (min): 56 min   Short Term Goals: Week 1:  PT Short Term Goal 1 (Week 1): Pt will complete least restrictive transfers with min A PT Short Term Goal 2 (Week 1): Pt will demonstrate recall of 3/3 back precautions PT Short Term Goal 3 (Week 1): Pt will perform bed mobility with min A consistently  Skilled Therapeutic Interventions/Progress Updates:   Pt received supine in bed and agreeable to therapy session but reports feeling "sore" from AM PT session - RN notified and present for medication administration. Supine>sit, HOB slightly elevated and using bedrails, with supervision and pt demonstrating proper logroll technique without cuing. Donned TLOS sitting EOB with min assist. Donned B LE shoes and L LE GRAFO max assist for time management. Performed seated B LE long arc quads 2x10 repetitions each with cuing for slow movement to increase quad activation. Squat pivot transfers with min assist for pivoting hips and guarding L knee to/from w/c throughout session. Performed B UE w/c propulsion ~13ft with supervision to therapy gym. Pt educated on w/c parts including brakes and how to don/dofff leg rests.Repeated minisquats from EOM without UE support and min/mod assist for lifting/lowering and L knee control focusing on B LE quad muscle activation and knee control to prevent hyperextension with pt noted to have significant B LE knee  hyperextension and hip abductor weakness with knees adducted during transfer. Sit>supine via reverse logroll technique with min assist for L LE management. Performed 2x10 repetitions of supine B LE hip adductor exercise against level 1 theraband with cuing for decreased ankle movement compensation. Supine>sit via logroll technique with mod assist for L LE management and trunk  upright and cuing for sequencing. Performed repeated minisquats, no UE support, with min/mod assist and increased focus on L LE weight bearing, knee alignment, and quad muscle activation for knee control to prevent hyperextension with pt demonstrating improvement in form compared to 1st set - mirror feedback for improved form. Ambulated ~35ft using RW x2 with min assist primarily for R LE knee control guarding knee buckling in stance and knee hyperextension during terminal stance phase immediately prior to initiating swing - trialed 2nd walk without L LE GRAFO with pt demonstrating decreased hyperextension and no greater knee buckling - will continue to assess appropriate AFO. Transported back to room in w/c. Squat pivot w/c>toilet using grab bars with min assist for pivoting hips. Pt left sitting on toilet with needs in reach and pt able to verbalize understanding of calling nursing staff for assistance - nursing staff notified of pt's position.   Therapy Documentation Precautions:  Precautions Precautions: Back, Fall Precaution Comments: reviewed spinal precautions Required Braces or Orthoses: Spinal Brace Spinal Brace: Thoracolumbosacral orthotic Restrictions Weight Bearing Restrictions: No  Pain:  Other than generalized soreness from participating in earlier therapy session no complaints of pain.   Therapy/Group: Individual Therapy  Tawana Scale, PT, DPT 12/10/2018, 3:25 PM

## 2018-12-10 NOTE — Progress Notes (Signed)
[place] PHYSICAL MEDICINE & REHABILITATION PROGRESS NOTE  Subjective/Complaints: No changes. Still "sore" but managing.   ROS: Patient denies fever, rash, sore throat, blurred vision, nausea, vomiting, diarrhea, cough, shortness of breath or chest pain,   headache, or mood change.     Objective: Vital Signs: Blood pressure (!) 133/53, pulse 61, temperature 98 F (36.7 C), resp. rate 20, height 4\' 7"  (1.397 m), weight 56.3 kg, SpO2 100 %. No results found. No results for input(s): WBC, HGB, HCT, PLT in the last 72 hours. No results for input(s): NA, K, CL, CO2, GLUCOSE, BUN, CREATININE, CALCIUM in the last 72 hours.  Physical Exam: BP (!) 133/53 (BP Location: Left Arm)   Pulse 61   Temp 98 F (36.7 C)   Resp 20   Ht 4\' 7"  (1.397 m)   Wt 56.3 kg   SpO2 100%   BMI 28.86 kg/m  Constitutional: No distress . Vital signs reviewed. HEENT: EOMI, oral membranes moist Neck: supple Cardiovascular: RRR without murmur. No JVD    Respiratory: CTA Bilaterally without wheezes or rales. Normal effort    GI: BS +, non-tender, non-distended  Musc: No edema or tenderness in extremities. Neurological: Alert and oriented Motor: B/l UE 5/5 proximal to distal, stable RLE: Hip flexion, knee extension 4-/5, ankle dorsiflexion 3+/5, stable LLE: HF, KE 3+/5, ADF 1-2/5  Sensation diminished to light touch bilateral LE--stable Skin: Back incision CDI, glued Psychiatric: pleasant.   Assessment/Plan: 1. Functional deficits secondary to cauda equina syndrome which require 3+ hours per day of interdisciplinary therapy in a comprehensive inpatient rehab setting.  Physiatrist is providing close team supervision and 24 hour management of active medical problems listed below.  Physiatrist and rehab team continue to assess barriers to discharge/monitor patient progress toward functional and medical goals  Care Tool:  Bathing  Bathing activity did not occur: Refused Body parts bathed by patient:  Right arm, Left arm, Front perineal area, Chest, Abdomen, Face, Left upper leg, Right lower leg, Left lower leg, Buttocks, Right upper leg   Body parts bathed by helper: Buttocks, Right upper leg, Left lower leg, Right lower leg, Left upper leg     Bathing assist Assist Level: Supervision/Verbal cueing     Upper Body Dressing/Undressing Upper body dressing   What is the patient wearing?: Pull over shirt, Orthosis    Upper body assist Assist Level: Supervision/Verbal cueing    Lower Body Dressing/Undressing Lower body dressing      What is the patient wearing?: Underwear/pull up, Pants     Lower body assist Assist for lower body dressing: Minimal Assistance - Patient > 75%     Toileting Toileting Toileting Activity did not occur (Clothing management and hygiene only): N/A (no void or bm)(i&o cath)  Toileting assist Assist for toileting: Moderate Assistance - Patient 50 - 74%     Transfers Chair/bed transfer  Transfers assist     Chair/bed transfer assist level: Minimal Assistance - Patient > 75%     Locomotion Ambulation   Ambulation assist      Assist level: 2 helpers(mod assist +2 for w/c follow) Assistive device: Walker-rolling Max distance: 16 ft   Walk 10 feet activity   Assist  Walk 10 feet activity did not occur: Safety/medical concerns  Assist level: 2 helpers Assistive device: Walker-rolling   Walk 50 feet activity   Assist Walk 50 feet with 2 turns activity did not occur: Safety/medical concerns         Walk 150 feet activity  Assist Walk 150 feet activity did not occur: Safety/medical concerns         Walk 10 feet on uneven surface  activity   Assist Walk 10 feet on uneven surfaces activity did not occur: Safety/medical concerns         Wheelchair     Assist Will patient use wheelchair at discharge?: Yes Type of Wheelchair: Manual    Wheelchair assist level: Supervision/Verbal cueing Max wheelchair distance:  150'    Wheelchair 50 feet with 2 turns activity    Assist        Assist Level: Supervision/Verbal cueing   Wheelchair 150 feet activity     Assist     Assist Level: Supervision/Verbal cueing      Medical Problem List and Plan: 1.  Gait abnormality, weakness, numbness secondary to cauda equina syndrome.  Continue CIR PT, OT   wc goals  2.  Antithrombotics: -DVT/anticoagulation:  Mechanical: Sequential compression devices, below knee Bilateral lower extremities             -antiplatelet therapy: N/A 3. Pain Management: Oxycodone prn.    Gabapentin decreased to 300 3 times daily on 7/11 given AKI as well as this being more acceptable dosing   - prn tylenol, heat    4. Mood: LCSW to follow for evaluation and support.              -antipsychotic agents: N/A 5. Neuropsych: This patient is capable of making decisions on her  own behalf. 6. Skin/Wound Care: Monitor wound daily for healing.  7. Fluids/Electrolytes/Nutrition: Monitor I/O.   8. HTN: Monitor BP bid--continue Nifedipine daily with lopressor bid.    Fair control 7/16    9. Tachycardia: asymptomatic--monitor HR  tid.    Heart rate within normal is on 7/16  Monitor with increased mobility 10. Urinary retention:   continueVoiding trial   UA equivocal, urine culture negative   -  emptying bladder more completely 11.  Bowel incontinence: seems to be improving  -observe for patterns  Continue bowel meds 12.  AKI  Creatinine 1.48 on 7/12, 1.43 7/13, trending down  Continue to encourage PO fluids--recheck next week 13.  Acute blood loss anemia  Hemoglobin 10 on 7.13 after transfusion of 1 unit PRBC on 7/11  LOS: 6 days A FACE TO FACE EVALUATION WAS PERFORMED  Meredith Staggers 12/10/2018, 10:11 AM

## 2018-12-10 NOTE — Progress Notes (Signed)
Physical Therapy Session Note  Patient Details  Name: Paula Hartman MRN: [id number] Date of Birth: Jun 30, 1940  Today's Date: 12/10/2018 PT Individual Time: 4695-0722 PT Individual Time Calculation (min): 70 min   Short Term Goals: Week 1:  PT Short Term Goal 1 (Week 1): Pt will complete least restrictive transfers with min A PT Short Term Goal 2 (Week 1): Pt will demonstrate recall of 3/3 back precautions PT Short Term Goal 3 (Week 1): Pt will perform bed mobility with min A consistently  Skilled Therapeutic Interventions/Progress Updates:   Pt in supine and agreeable to therapy, requesting to toilet. Pain 7/10 in back and LEs, RN aware and providing medication. Supine>sit w/ supervision and min assist to don TLSO. Total assist to don shoes and LAFO. Min assist stand pivot to w/c. Mod assist toilet transfer w/ min assist for LE garment management. Pericare w/o assist. Pt self-propelled w/c to/from therapy gym.   Worked on Personnel officer w/ RW and LAFO this session. Ambulated 40' x4 w/ min assist overall. Pt w/ L hyperextension in stance, ~10% of the time. Verbal cues for gait pattern, min manual assist for lateral weight shifting. Needed most assist to maintain upright as knees buckling intermittently (L>R), min assist to correct. Heavy UE use on RW throughout.   NuStep 3 min x3 @ level 1 for LE strengthening and reciprocal movement pattern. Pt w/ moderate increase in work of breathing on NuStep, prompting low resistance and short bouts.   Pt self-propelled w/c back to room. Ended session in w/c, all needs in reach.    Therapy Documentation Precautions:  Precautions Precautions: Back, Fall Precaution Comments: reviewed spinal precautions Required Braces or Orthoses: Spinal Brace Spinal Brace: Thoracolumbosacral orthotic Restrictions Weight Bearing Restrictions: No  Therapy/Group: Individual Therapy  Christabell Loseke K Breaker Springer 12/10/2018, 11:59 AM

## 2018-12-10 NOTE — Progress Notes (Signed)
Orthopedic Tech Progress Note Patient Details:  Paula Hartman [birth year] [id number]  Patient ID: [name], female   DOB: Sep 01, 1940, 78 y.o.   MRN: [id number]   [name] 12/10/2018, 12:04 PMCalled [employer] for left AFO brace.

## 2018-12-10 NOTE — Progress Notes (Signed)
Occupational Therapy Session Note  Patient Details  Name: Paula Hartman MRN: [id number] Date of Birth: 26-Nov-1940  Today's Date: 12/10/2018 OT Individual Time: 9476-5465 OT Individual Time Calculation (min): 75 min    Short Term Goals: Week 1:  OT Short Term Goal 1 (Week 1): Pt will perform toilet transfer at mod A consistently. OT Short Term Goal 2 (Week 1): Pt will perform LB dressing at mod A overall. OT Short Term Goal 3 (Week 1): Pt will perform toileting at mod A overall.  Skilled Therapeutic Interventions/Progress Updates:    1:1. Pt recived in bed with no c/o pain. tp completes supine>sitting EOB with S and dons TLSO prior to ambualting with MIN A into bathroom. Pt completes bathing at shower level with supervision seated leaning B directions to wash buttocks. Pt dons UB clothing and TLSO with VC for donning brace without twisting. Pt dons LB clothign with MIN A for standing balance while advancing pant past hips. Pt brushes teeth/hair with MOD I seated in w/c. OT selects differed shower equipment as bariatric BSC hurts pt bottom. Pt able to don shoe with AFO and shoe funnel bystanding to slide foot into shoe with CGA, however during 2 trials foot slipped back out of shoe (question if me needs larger shoe size d/t wide feet). Pt completes standing hamstring curl, ab/adduct, marches and seated LAQ with CGA for standing exercises and instructional cues to not allow L knee to hyperextend. Exited sesion with pt seated in bed,exit alarm on and call ligt inreach  Therapy Documentation Precautions:  Precautions Precautions: Back, Fall Precaution Comments: reviewed spinal precautions Required Braces or Orthoses: Spinal Brace Spinal Brace: Thoracolumbosacral orthotic Restrictions Weight Bearing Restrictions: No General:   Vital Signs:  Pain:   ADL:   Vision   Perception    Praxis   Exercises:   Other Treatments:     Therapy/Group: Individual Therapy  Tonny Branch 12/10/2018, 8:44 AM

## 2018-12-11 ENCOUNTER — Inpatient Hospital Stay (HOSPITAL_COMMUNITY): Payer: Medicare Other | Admitting: Physical Therapy

## 2018-12-11 ENCOUNTER — Inpatient Hospital Stay (HOSPITAL_COMMUNITY): Payer: Medicare Other | Admitting: *Deleted

## 2018-12-11 ENCOUNTER — Inpatient Hospital Stay (HOSPITAL_COMMUNITY): Payer: Medicare Other | Admitting: Occupational Therapy

## 2018-12-11 LAB — OCCULT BLOOD X 1 CARD TO LAB, STOOL: Fecal Occult Bld: NEGATIVE

## 2018-12-11 MED ORDER — OXYCODONE-ACETAMINOPHEN 5-325 MG PO TABS
1.0000 | ORAL_TABLET | Freq: Four times a day (QID) | ORAL | Status: DC | PRN
  Administered 2018-12-13 – 2018-12-21 (×7): 1 via ORAL
  Filled 2018-12-11 (×8): qty 1

## 2018-12-11 MED ORDER — MUSCLE RUB 10-15 % EX CREA: 1.0000 "application " | TOPICAL_CREAM | Freq: Three times a day (TID) | CUTANEOUS | 0 refills | Status: AC

## 2018-12-11 MED ORDER — PREGABALIN 25 MG PO CAPS: 25.0000 mg | ORAL_CAPSULE | Freq: Every day | ORAL | Status: DC

## 2018-12-11 MED ORDER — PREGABALIN 25 MG PO CAPS
25.0000 mg | ORAL_CAPSULE | Freq: Every day | ORAL | Status: DC
  Administered 2018-12-11 – 2018-12-21 (×11): 25 mg via ORAL
  Filled 2018-12-11 (×11): qty 1

## 2018-12-11 NOTE — Progress Notes (Signed)
Occupational Therapy Session Note  Patient Details  Name: Paula Hartman MRN: [id number] Date of Birth: Jul 06, 1940  Today's Date: 12/11/2018 OT Individual Time: 2671-2458 OT Individual Time Calculation (min): 70 min    Short Term Goals: Week 1:  OT Short Term Goal 1 (Week 1): Pt will perform toilet transfer at mod A consistently. OT Short Term Goal 2 (Week 1): Pt will perform LB dressing at mod A overall. OT Short Term Goal 3 (Week 1): Pt will perform toileting at mod A overall.  Skilled Therapeutic Interventions/Progress Updates:    Pt greeted in bathroom transferred to commode with nurse tech. Pt needed assistance with managing clothing. Pt was able to void bladder but no BM. Pt was able to complete toileting with set-up A. Pt completed stand-pivot back to wc, then from wc to tub bench with min A and use of grab bars. Pt able to wash buttocks in shower using leaning method. Pt also able to achieve figure 4 position to was feet as well. Dressing completed in wc with pt able to thread B LEs into pant legs using figure 4 position. Pt then completed sit<>stand with min A and min A for balance when pulling pants up over hips. Grooming tasks completed at the sink. OT provided pt with TLSO and needed min A.  Pt brought to therapy gym and worked on sit<>stand, standing balance/endurance, and LB strengthening. OT issued walker bag to patient and practiced reaching into bag to collect clothes pin, then reaching outside base of support to place on line. LB strengthening and coordination with seated toe taps on small cones. Progressed to standing toe taps on cones with Min A for balance and increased knee hyperextension with fatigue. Pt returned to room and completed stand-pivot back to bed with CGA. Pt left semi-reclined in bed with bed alarm on and needs met.    Therapy Documentation Precautions:  Precautions Precautions: Back, Fall Precaution Comments: reviewed spinal precautions Required Braces  or Orthoses: Spinal Brace Spinal Brace: Thoracolumbosacral orthotic Restrictions Weight Bearing Restrictions: No Pain: Pain Assessment Pain Scale: Faces Faces Pain Scale: Hurts a little bit Pain Type: Acute pain;Surgical pain Pain Location: Back Pain Orientation: Lower Pain Descriptors / Indicators: Aching Pain Onset: On-going Patients Stated Pain Goal: 3 Pain Intervention(s): Repositioned   Therapy/Group: Individual Therapy  Valma Cava 12/11/2018, 11:36 AM

## 2018-12-11 NOTE — Progress Notes (Signed)
Called [facility] for Orthopedic Tech Progress Note Patient Details:  Paula Hartman Jul 02, 1940 [id number]  Patient ID: Paula Hartman, female   DOB: 04-Jan-1941, 78 y.o.   MRN: [id number]   Paula Hartman 12/11/2018, 1:01 PMCalled [facility] for left Prafo boot.

## 2018-12-11 NOTE — Progress Notes (Signed)
[place] PHYSICAL MEDICINE & REHABILITATION PROGRESS NOTE  Subjective/Complaints: Didn't sleep well because of "numbness" in legs. Making progress with therapies  ROS: Patient denies fever, rash, sore throat, blurred vision, nausea, vomiting, diarrhea, cough, shortness of breath or chest pain, joint or back pain, headache, or mood change.      Objective: Vital Signs: Blood pressure 135/65, pulse 62, temperature 97.7 F (36.5 C), temperature source Oral, resp. rate 18, height 4\' 7"  (1.397 m), weight 56.3 kg, SpO2 100 %. No results found. No results for input(s): WBC, HGB, HCT, PLT in the last 72 hours. No results for input(s): NA, K, CL, CO2, GLUCOSE, BUN, CREATININE, CALCIUM in the last 72 hours.  Physical Exam: BP 135/65 (BP Location: Right Arm)   Pulse 62   Temp 97.7 F (36.5 C) (Oral)   Resp 18   Ht 4\' 7"  (1.397 m)   Wt 56.3 kg   SpO2 100%   BMI 28.86 kg/m  Constitutional: No distress . Vital signs reviewed. HEENT: EOMI, oral membranes moist Neck: supple Cardiovascular: RRR without murmur. No JVD    Respiratory: CTA Bilaterally without wheezes or rales. Normal effort    GI: BS +, non-tender, non-distended  Musc: No edema or tenderness in extremities. Neurological: Alert and oriented Motor: B/l UE 5/5 proximal to distal, stable RLE: Hip flexion, knee extension 4-/5, ankle dorsiflexion 3+/5. LLE: HF, KE 3+/5, ADF ADF 1+ to 2/5. Whips right leg during swing phase. Steppage gait bilateral LE's Sensation diminished to light touch bilateral LE--stable Skin: Back incision CDI, glued--stable in appearance Psychiatric: pleasant.   Assessment/Plan: 1. Functional deficits secondary to cauda equina syndrome which require 3+ hours per day of interdisciplinary therapy in a comprehensive inpatient rehab setting.  Physiatrist is providing close team supervision and 24 hour management of active medical problems listed below.  Physiatrist and rehab team continue to assess barriers  to discharge/monitor patient progress toward functional and medical goals  Care Tool:  Bathing  Bathing activity did not occur: Refused Body parts bathed by patient: Right arm, Left arm, Front perineal area, Chest, Abdomen, Face, Left upper leg, Right lower leg, Left lower leg, Buttocks, Right upper leg   Body parts bathed by helper: Buttocks, Right upper leg, Left lower leg, Right lower leg, Left upper leg     Bathing assist Assist Level: Supervision/Verbal cueing     Upper Body Dressing/Undressing Upper body dressing   What is the patient wearing?: Pull over shirt, Orthosis    Upper body assist Assist Level: Supervision/Verbal cueing    Lower Body Dressing/Undressing Lower body dressing      What is the patient wearing?: Underwear/pull up, Pants     Lower body assist Assist for lower body dressing: Minimal Assistance - Patient > 75%     Toileting Toileting Toileting Activity did not occur (Clothing management and hygiene only): N/A (no void or bm)(i&o cath)  Toileting assist Assist for toileting: Moderate Assistance - Patient 50 - 74%     Transfers Chair/bed transfer  Transfers assist     Chair/bed transfer assist level: Minimal Assistance - Patient > 75%     Locomotion Ambulation   Ambulation assist      Assist level: Minimal Assistance - Patient > 75% Assistive device: Walker-rolling Max distance: 71ft   Walk 10 feet activity   Assist  Walk 10 feet activity did not occur: Safety/medical concerns  Assist level: Minimal Assistance - Patient > 75% Assistive device: Walker-rolling   Walk 50 feet activity   Assist  Walk 50 feet with 2 turns activity did not occur: Safety/medical concerns         Walk 150 feet activity   Assist Walk 150 feet activity did not occur: Safety/medical concerns         Walk 10 feet on uneven surface  activity   Assist Walk 10 feet on uneven surfaces activity did not occur: Safety/medical concerns          Wheelchair     Assist Will patient use wheelchair at discharge?: Yes Type of Wheelchair: Manual    Wheelchair assist level: Set up assist, Supervision/Verbal cueing Max wheelchair distance: 152ft    Wheelchair 50 feet with 2 turns activity    Assist        Assist Level: Set up assist, Supervision/Verbal cueing   Wheelchair 150 feet activity     Assist     Assist Level: Supervision/Verbal cueing      Medical Problem List and Plan: 1.  Gait abnormality, weakness, numbness secondary to cauda equina syndrome.  Continue CIR PT, OT   wc goals, short dx gait goals  -ordered PRAFO LLE to help support foot in bed  -will need bilateral custom AFO's for gait 2.  Antithrombotics: -DVT/anticoagulation:  Mechanical: Sequential compression devices, below knee Bilateral lower extremities             -antiplatelet therapy: N/A 3. Pain Management: Oxycodone prn.     -pt is describing tingling. It does in fact appear to be painful, or at least keeping her up at night. Told her that gabapentin was being rxed for these symptoms. She asked if we could try something different.   -will initiate lyrica 25mg  qhs beginning tonight  - prn tylenol, heat    4. Mood: LCSW to follow for evaluation and support.              -antipsychotic agents: N/A 5. Neuropsych: This patient is capable of making decisions on her  own behalf. 6. Skin/Wound Care: Monitor wound daily for healing.  7. Fluids/Electrolytes/Nutrition: Monitor I/O.   8. HTN: Monitor BP bid--continue Nifedipine daily with lopressor bid.    Fair control 7/17    9. Tachycardia:    Heart rate within normal is on 7/17   10. Urinary retention:    UA equivocal, urine culture negative   -  emptying bladder more completely 11.  Bowel incontinence: seems to be improving  -observe for patterns  Continue bowel meds 12.  AKI  Creatinine 1.48 on 7/12, 1.43 7/13, trending down  Continue to encourage PO fluids--recheck next  week 13.  Acute blood loss anemia  Hemoglobin 10 on 7.13 after transfusion of 1 unit PRBC on 7/11  LOS: 7 days A FACE TO FACE EVALUATION WAS PERFORMED  Meredith Staggers 12/11/2018, 9:38 AM

## 2018-12-11 NOTE — Progress Notes (Signed)
Physical Therapy Session Note  Patient Details  Name: Paula Hartman MRN: [id number] Date of Birth: [birth year]  Today's Date: 12/11/2018 PT Individual Time: 1415-1515 PT Individual Time Calculation (min): 60 min   Short Term Goals: Week 1:  PT Short Term Goal 1 (Week 1): Pt will complete least restrictive transfers with min A PT Short Term Goal 2 (Week 1): Pt will demonstrate recall of 3/3 back precautions PT Short Term Goal 3 (Week 1): Pt will perform bed mobility with min A consistently  Skilled Therapeutic Interventions/Progress Updates:   Pt in supine and agreeable to therapy, denies pain. Supine>sit w/ supervision. Min assist to don TLSO. Min assist stand pivot to w/c. Pt self-propelled w/c to/from therapy gym to work on UE endurance/strengthening. Worked on BLE and core strengthening this session. See details below. Brief rest breaks in between sets 2/2 fatigue, no increase in pain. Kinetron @ 10 cm/sec to work on posterior chain musculature x5 min. Returned to room and pt requesting toileting assistance. Stand pivot transfer to toilet w/ min assist. Ended session toileting and in care of RN, all needs met and in reach. Missed 15 min of skilled PT 2/2 toileting.   BLE exercises: -TA activation, 3x5 -supine bridge (very slight lift), 3x5 -adduction squeezes, 3x10 -abduction w/ beige theraband resistance, 3x10  -knee to chest, 3x10 -sidelying hip abduction, active assisted, 3x10 -sidelying hip clamshell, active assisted, 3x10   Therapy Documentation Precautions:  Precautions Precautions: Back, Fall Precaution Comments: reviewed spinal precautions Required Braces or Orthoses: Spinal Brace Spinal Brace: Thoracolumbosacral orthotic Restrictions Weight Bearing Restrictions: No Vital Signs: Therapy Vitals Temp: 98.3 F (36.8 C) Pulse Rate: 67 Resp: 18 BP: (!) 124/47 Patient Position (if appropriate): Lying Oxygen Therapy SpO2: 99 % O2 Device: Room Air  Therapy/Group:  Individual Therapy  [name] Clent Demark 12/11/2018, 3:25 PM
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
## 2018-12-11 NOTE — Plan of Care (Signed)
  Problem: Consults Goal: RH GENERAL PATIENT EDUCATION Description: See Patient Education module for education specifics. Outcome: Progressing Goal: Skin Care Protocol Initiated - if Braden Score 18 or less Description: If consults are not indicated, leave blank or document N/A Outcome: Progressing   Problem: RH BOWEL ELIMINATION Goal: RH STG MANAGE BOWEL WITH ASSISTANCE Description: STG Manage Bowel with mod I Assistance. Outcome: Progressing   Problem: RH BLADDER ELIMINATION Goal: RH STG MANAGE BLADDER WITH ASSISTANCE Description: STG Manage Bladder With mod I Assistance Outcome: Progressing   Problem: RH SKIN INTEGRITY Goal: RH STG SKIN FREE OF INFECTION/BREAKDOWN Outcome: Progressing Goal: RH STG MAINTAIN SKIN INTEGRITY WITH ASSISTANCE Description: STG Maintain Skin Integrity With mod I Assistance. Outcome: Progressing   Problem: RH SAFETY Goal: RH STG ADHERE TO SAFETY PRECAUTIONS W/ASSISTANCE/DEVICE Description: STG Adhere to Safety Precautions With mod I Assistance/Device. Outcome: Progressing   Problem: RH PAIN MANAGEMENT Goal: RH STG PAIN MANAGED AT OR BELOW PT'S PAIN GOAL Outcome: Progressing   Problem: RH KNOWLEDGE DEFICIT GENERAL Goal: RH STG INCREASE KNOWLEDGE OF SELF CARE AFTER HOSPITALIZATION Outcome: Progressing   Problem: Consults Goal: RH SPINAL CORD INJURY PATIENT EDUCATION Description:  See Patient Education module for education specifics.  Outcome: Progressing Goal: Skin Care Protocol Initiated - if Braden Score 18 or less Description: If consults are not indicated, leave blank or document N/A Outcome: Progressing Goal: Nutrition Consult-if indicated Outcome: Progressing   Problem: SCI BOWEL ELIMINATION Goal: RH STG MANAGE BOWEL WITH ASSISTANCE Description: STG Manage Bowel with mod I Assistance. Outcome: Progressing Goal: RH STG SCI MANAGE BOWEL PROGRAM W/ASSIST OR AS APPROPRIATE Description: STG SCI Manage bowel program w/assist or as  appropriate. Outcome: Progressing   Problem: SCI BLADDER ELIMINATION Goal: RH STG MANAGE BLADDER WITH ASSISTANCE Description: STG Manage Bladder With mod I Assistance Outcome: Progressing   Problem: RH SKIN INTEGRITY Goal: RH STG SKIN FREE OF INFECTION/BREAKDOWN Outcome: Progressing Goal: RH STG MAINTAIN SKIN INTEGRITY WITH ASSISTANCE Description: STG Maintain Skin Integrity With mod I Assistance. Outcome: Progressing   Problem: RH SAFETY Goal: RH STG ADHERE TO SAFETY PRECAUTIONS W/ASSISTANCE/DEVICE Description: STG Adhere to Safety Precautions With Assistance/Device. Outcome: Progressing   Problem: RH PAIN MANAGEMENT Goal: RH STG PAIN MANAGED AT OR BELOW PT'S PAIN GOAL Description: Pain scale <4/10 Outcome: Progressing   Problem: RH KNOWLEDGE DEFICIT SCI Goal: RH STG INCREASE KNOWLEDGE OF SELF CARE AFTER SCI Outcome: Progressing

## 2018-12-11 NOTE — Progress Notes (Signed)
Physical Therapy Session Note  Patient Details  Name: Paula Hartman MRN: [id number] Date of Birth: Aug 03, 1940  Today's Date: 12/11/2018 PT Individual Time: 0805-0901 PT Individual Time Calculation (min): 56 min   Short Term Goals: Week 1:  PT Short Term Goal 1 (Week 1): Pt will complete least restrictive transfers with min A PT Short Term Goal 2 (Week 1): Pt will demonstrate recall of 3/3 back precautions PT Short Term Goal 3 (Week 1): Pt will perform bed mobility with min A consistently  Skilled Therapeutic Interventions/Progress Updates:    Pt received supine in bed with orthotist, Meagan, present for AFO consult and pt agreeable to therapy session. Supine>sit, HOB partially elevated and using bedrails, with supervision. Donned TLSO sitting on EOB with min assist. Donned B LE shoes max assist for time management. Squat pivot EOB>w/c with min assist for pivoting hips. Transported to/from gym in w/c. Ambulated 7ft using RW with min assist primarily for L LE knee control to guard buckling during intial stance and block hyperextension during terminal stance (not wearing AFO) - orthotist and MD present to assess gait mechanics. PT, Orthotist, and patient discussed AFO needs to address guarding knee buckling during initial stance, ankle DF during swing, and blocking knee hyperextension during terminal stance. Ambulated 74ft using RW with min assist again primarily for L LE knee control with cuing for increased ankle DF and foot clearance during stance. Ambulated 40ft x2 using RW and wearing posterior leaf spring AFO on L LE to assist with improved ankle DF during swing phase with min assist primarily for guarding L knee buckling and knee hyperextension - pt demonstrating improving knee control with no significant buckling or hyperextension noted - manual facilitation and tactile cuing for improved L hip extension during stance with trunk upright. Performed repeated sit<>stands w/c<>no UE support with  mirror feedback and cuing for improved LE alignment, anterior trunk lean, and hip/knee extension to come to standing. Transported back to room in w/c. Stand pivot w/c>toilet using grab bars with min assist and pt able to perform LB clothing management, no UE support, with min assist for balance. Pt left sitting on toilet with needs in reach, educated to call nursing staff for assistance, and nursing staff notified of pt's position.  Therapy Documentation Precautions:  Precautions Precautions: Back, Fall Precaution Comments: reviewed spinal precautions Required Braces or Orthoses: Spinal Brace Spinal Brace: Thoracolumbosacral orthotic Restrictions Weight Bearing Restrictions: No  Pain:  Reports pain in low back during sit<>stand exercise therefore discontinued further repetitions and pt reports diminishing pain with seated rest break.   Therapy/Group: Individual Therapy  Tawana Scale, PT, DPT 12/11/2018, 7:48 AM

## 2018-12-12 ENCOUNTER — Inpatient Hospital Stay (HOSPITAL_COMMUNITY): Payer: Medicare Other | Admitting: Occupational Therapy

## 2018-12-12 DIAGNOSIS — N319 Neuromuscular dysfunction of bladder, unspecified: Secondary | ICD-10-CM

## 2018-12-12 DIAGNOSIS — K592 Neurogenic bowel, not elsewhere classified: Secondary | ICD-10-CM

## 2018-12-12 NOTE — Plan of Care (Signed)
  Problem: Consults Goal: RH SPINAL CORD INJURY PATIENT EDUCATION Description:  See Patient Education module for education specifics.  Outcome: Progressing   Problem: SCI BOWEL ELIMINATION Goal: RH STG MANAGE BOWEL WITH ASSISTANCE Description: STG Manage Bowel with mod I Assistance. Outcome: Progressing Goal: RH STG SCI MANAGE BOWEL PROGRAM W/ASSIST OR AS APPROPRIATE Description: STG SCI Manage bowel program with mod I assist or as appropriate. Outcome: Progressing   Problem: SCI BLADDER ELIMINATION Goal: RH STG MANAGE BLADDER WITH ASSISTANCE Description: STG Manage Bladder With mod I Assistance Outcome: Progressing   Problem: RH SKIN INTEGRITY Goal: RH STG MAINTAIN SKIN INTEGRITY WITH ASSISTANCE Description: STG Maintain Skin Integrity With mod I Assistance. Outcome: Progressing   Problem: RH SAFETY Goal: RH STG ADHERE TO SAFETY PRECAUTIONS W/ASSISTANCE/DEVICE Description: STG Adhere to Safety Precautions With cues and reminder Assistance/Device. Outcome: Progressing   Problem: RH PAIN MANAGEMENT Goal: RH STG PAIN MANAGED AT OR BELOW PT'S PAIN GOAL Description: Pain scale <4/10 Outcome: Progressing

## 2018-12-12 NOTE — Progress Notes (Signed)
Occupational Therapy Session Note  Patient Details  Name: Paula Hartman MRN: [id number] Date of Birth: July 19, 1940  Today's Date: 12/12/2018 OT Individual Time: 1020-1045 OT Individual Time Calculation (min): 25 min    Short Term Goals: Week 1:  OT Short Term Goal 1 (Week 1): Pt will perform toilet transfer at mod A consistently. OT Short Term Goal 2 (Week 1): Pt will perform LB dressing at mod A overall. OT Short Term Goal 3 (Week 1): Pt will perform toileting at mod A overall.      Skilled Therapeutic Interventions/Progress Updates:    Pt seen this session to focus on toilet transfers, toileting and LB dressing. Pt able to sit to EOB with S using back precautions.  Donned TLSO with min A and stood to RW to stand pivot with min A to w/c to transfer to toilet using bars.  Pt stood with bar and pulled pants down and up with min A.  L knee hyperextending significantly and pt unable to feel it due to lack of sensation.  She tends to move quickly to get things "done fast" but it makes her movement patterns more unsafe.   Transferred back to w.c, washed hands and then transferred to recliner. Chair pad alarm on and all needs met.    Therapy Documentation Precautions:  Precautions Precautions: Back, Fall Precaution Comments: reviewed spinal precautions Required Braces or Orthoses: Spinal Brace Spinal Brace: Thoracolumbosacral orthotic Restrictions Weight Bearing Restrictions: No      Pain: Pain Assessment Faces Pain Scale: No hurt   Therapy/Group: Individual Therapy  [place] 12/12/2018, 11:54 AM

## 2018-12-12 NOTE — Progress Notes (Signed)
[place] PHYSICAL MEDICINE & REHABILITATION PROGRESS NOTE  Subjective/Complaints: No issues overnite   ROS: Patient denies fever, rash, sore throat, blurred vision, nausea, vomiting, diarrhea, cough, shortness of breath or chest pain, joint or back pain, headache, or mood change.      Objective: Vital Signs: Blood pressure (!) 146/68, pulse (!) 56, temperature 97.9 F (36.6 C), resp. rate 20, height 4\' 7"  (1.397 m), weight 56.3 kg, SpO2 98 %. No results found. No results for input(s): WBC, HGB, HCT, PLT in the last 72 hours. No results for input(s): NA, K, CL, CO2, GLUCOSE, BUN, CREATININE, CALCIUM in the last 72 hours.  Physical Exam: BP (!) 146/68 (BP Location: Left Arm)   Pulse (!) 56   Temp 97.9 F (36.6 C)   Resp 20   Ht 4\' 7"  (1.397 m)   Wt 56.3 kg   SpO2 98%   BMI 28.86 kg/m  Constitutional: No distress . Vital signs reviewed. HEENT: EOMI, oral membranes moist Neck: supple Cardiovascular: RRR without murmur. No JVD    Respiratory: CTA Bilaterally without wheezes or rales. Normal effort    GI: BS +, non-tender, non-distended  Musc: No edema or tenderness in extremities. Neurological: Alert and oriented Motor: B/l UE 5/5 proximal to distal, stable RLE: Hip flexion, knee extension 4-/5, ankle dorsiflexion 3+/5. LLE: HF, KE 3+/5, ADF ADF 1+ to 2/5. Whips right leg during swing phase. Steppage gait bilateral LE's Sensation diminished to light touch bilateral LE--stable Skin: Back incision CDI, glued--stable in appearance Psychiatric: pleasant.   Assessment/Plan: 1. Functional deficits secondary to cauda equina syndrome which require 3+ hours per day of interdisciplinary therapy in a comprehensive inpatient rehab setting.  Physiatrist is providing close team supervision and 24 hour management of active medical problems listed below.  Physiatrist and rehab team continue to assess barriers to discharge/monitor patient progress toward functional and medical  goals  Care Tool:  Bathing  Bathing activity did not occur: Refused Body parts bathed by patient: Right arm, Left arm, Front perineal area, Chest, Abdomen, Face, Left upper leg, Right lower leg, Left lower leg, Buttocks, Right upper leg   Body parts bathed by helper: Buttocks, Right upper leg, Left lower leg, Right lower leg, Left upper leg     Bathing assist Assist Level: Supervision/Verbal cueing     Upper Body Dressing/Undressing Upper body dressing   What is the patient wearing?: Pull over shirt, Orthosis    Upper body assist Assist Level: Supervision/Verbal cueing    Lower Body Dressing/Undressing Lower body dressing      What is the patient wearing?: Underwear/pull up, Pants     Lower body assist Assist for lower body dressing: Contact Guard/Touching assist     Toileting Toileting Toileting Activity did not occur (Clothing management and hygiene only): N/A (no void or bm)(i&o cath)  Toileting assist Assist for toileting: Minimal Assistance - Patient > 75%     Transfers Chair/bed transfer  Transfers assist     Chair/bed transfer assist level: Minimal Assistance - Patient > 75%     Locomotion Ambulation   Ambulation assist      Assist level: Minimal Assistance - Patient > 75% Assistive device: Walker-rolling Max distance: 68ft   Walk 10 feet activity   Assist  Walk 10 feet activity did not occur: Safety/medical concerns  Assist level: Minimal Assistance - Patient > 75% Assistive device: Walker-rolling   Walk 50 feet activity   Assist Walk 50 feet with 2 turns activity did not occur: Safety/medical concerns  Walk 150 feet activity   Assist Walk 150 feet activity did not occur: Safety/medical concerns         Walk 10 feet on uneven surface  activity   Assist Walk 10 feet on uneven surfaces activity did not occur: Safety/medical concerns         Wheelchair     Assist Will patient use wheelchair at discharge?:  Yes Type of Wheelchair: Manual    Wheelchair assist level: Supervision/Verbal cueing Max wheelchair distance: 150'    Wheelchair 50 feet with 2 turns activity    Assist        Assist Level: Supervision/Verbal cueing   Wheelchair 150 feet activity     Assist     Assist Level: Supervision/Verbal cueing      Medical Problem List and Plan: 1.  Gait abnormality, weakness, numbness secondary to cauda equina syndrome.  Continue CIR PT, OT   wc goals, short dx gait goals  -ordered PRAFO LLE to help support foot in bed  -will need bilateral custom AFO's for gait 2.  Antithrombotics: -DVT/anticoagulation:  Mechanical: Sequential compression devices, below knee Bilateral lower extremities             -antiplatelet therapy: N/A 3. Pain Management: Oxycodone prn.    .   -cont lyrica 25mg  qhs   - prn tylenol, heat    4. Mood: LCSW to follow for evaluation and support.              -antipsychotic agents: N/A 5. Neuropsych: This patient is capable of making decisions on her  own behalf. 6. Skin/Wound Care: Monitor wound daily for healing.  7. Fluids/Electrolytes/Nutrition: Monitor I/O.   8. HTN: Monitor BP bid--continue Nifedipine daily with lopressor bid.    Fair control 7/17    9. Tachycardia:  resolved  Vitals:   12/11/18 2020 12/12/18 0516  BP: (!) 152/48 (!) 146/68  Pulse: 81 (!) 56  Resp: 18 20  Temp: 98 F (36.7 C) 97.9 F (36.6 C)  SpO2: 98% 98%     10. Urinary retention: per pt starting to void    UA equivocal, urine culture negative   -  emptying bladder more completely 11.  Bowel incontinence: seems to be improving  -observe for patterns  Continue bowel meds 12.  AKI  Creatinine 1.48 on 7/12, 1.43 7/13, trending down  Continue to encourage PO fluids--recheck next week 13.  Acute blood loss anemia  Hemoglobin 10 on 7.13 after transfusion of 1 unit PRBC on 7/11  LOS: 8 days A FACE TO FACE EVALUATION WAS PERFORMED  Charlett Blake 12/12/2018, 8:35 AM

## 2018-12-13 ENCOUNTER — Inpatient Hospital Stay (HOSPITAL_COMMUNITY): Payer: Medicare Other

## 2018-12-13 NOTE — Progress Notes (Addendum)
Physical Therapy Session Note  Patient Details  Name: Paula Hartman MRN: [id number] Date of Birth: Jun 21, 1940  Today's Date: 12/13/2018 PT Individual Time: 1500-1600 PT Individual Time Calculation (min): 60 min   Short Term Goals: Week 1:  PT Short Term Goal 1 (Week 1): Pt will complete least restrictive transfers with min A PT Short Term Goal 2 (Week 1): Pt will demonstrate recall of 3/3 back precautions PT Short Term Goal 3 (Week 1): Pt will perform bed mobility with min A consistently      Skilled Therapeutic Interventions/Progress Updates:  Pt resting in bed.  Pain low back rated 7/10, premedicated. Pt initially declined due to back pain, but with encouragement agreed to get OOB.  Supine> sit with supervision, using railing.  Pt stated that her dtr plans to get a hospital bed for her at d/c.  In sitting, pt donned TLSO with mod assist, cues to avoid twisting as she passed it around her body.  Pt doffed non slip socks and donned socks with supervision.  Pt donned bil shoes.  Pt has new, size 6 shoes, but they were too narrow to get her foot into. She used older, wider shoes.   W/c propulsion on level tile with bil UEs, supervision.  Pt locked/unlocked brakes with 1 cue.  neuromuscular re-education via forced use for bil alternating reciprocal movement, usng kInetron from w/c level, at resistance 40 cm/sec x 2 minutes focusing on quadriceps, at 50 cm/sec x 2 minutes.    Gait training iwht ACE L knee for stability, x 25' with min assist, mod assist briefly as L knee buckled.  L knee hyperextends even with ACE.    Pt reported she needed to urinate.  Min assist for toilet transfer.  Pt continent of bladder, and performed peri care.  PT assisted with pulling up clothing.  Hand washing from w/c level, set up.  W/c> bed transfer: pt required cues to avoid twisting as she attempted pushing up with L hand on armrest, and pulling on bed rail with R hand.  Stand pivot with min assist using  RW.  Pt doffed TLSO with supervision.  Sit>  Supine with supervision, pt reminding herself not to twist.  Bed alarm set and needs left at hand.     Therapy Documentation Precautions:  Precautions Precautions: Back, Fall Precaution Comments: reviewed spinal precautions Required Braces or Orthoses: Spinal Brace Spinal Brace: Thoracolumbosacral orthotic Restrictions Weight Bearing Restrictions: No       Therapy/Group: Individual Therapy  Alece Koppel 12/13/2018, 4:14 PM

## 2018-12-13 NOTE — Progress Notes (Signed)
Occupational Therapy Session Note  Patient Details  Name: Paula Hartman MRN: [id number] Date of Birth: 24-May-1941  Today's Date: 12/13/2018 OT Individual Time: 8527-7824 OT Individual Time Calculation (min): 70 min    Short Term Goals: Week 1:  OT Short Term Goal 1 (Week 1): Pt will perform toilet transfer at mod A consistently. OT Short Term Goal 2 (Week 1): Pt will perform LB dressing at mod A overall. OT Short Term Goal 3 (Week 1): Pt will perform toileting at mod A overall.  Skilled Therapeutic Interventions/Progress Updates:    Pt received supine with no c/o pain. Pt completed bed mobility with good use of log rolling technique with (S). Pt donned shoes EOB with min A initially, and then (S) when encouraged to use shoe funnel. Min A to don TLSO seated. Pt completed stand pivot transfer to w/c with min A. Oral care completed at sink with set up assist from seated level. Pt propelled w/c to therapy gym with mod I. Pt required min cueing for w/c set up when transferring to mat. Pt stood with RW and completed overhead reaching task with facilitation required at posterior knee to prevent hyperextension. Pt completed resistive knee extension and flexion with yellow resistive band to strengthen BLE. Pt completed mini squats with BUE support and blocking at L knee, with little hyperextension occurring. Pt then completed activity with focus on standing balance with knee stabilization, with no UE support on RW. Min-mod A for balance support. Pt returned to room and transitioned back to bed. Pt was left supine with all needs met, bed alarm set.   Therapy Documentation Precautions:  Precautions Precautions: Back, Fall Precaution Comments: reviewed spinal precautions Required Braces or Orthoses: Spinal Brace Spinal Brace: Thoracolumbosacral orthotic Restrictions Weight Bearing Restrictions: No   Therapy/Group: Individual Therapy  Curtis Sites 12/13/2018, 7:12 AM

## 2018-12-13 NOTE — Progress Notes (Signed)
[place] PHYSICAL MEDICINE & REHABILITATION PROGRESS NOTE  Subjective/Complaints: No issues overnite , no bowel issues today  ROS: Patient deniesCP, SOB, N/V/D  Objective: Vital Signs: Blood pressure (!) 159/53, pulse (!) 52, temperature 97.6 F (36.4 C), resp. rate 16, height 4\' 7"  (1.397 m), weight 56.3 kg, SpO2 100 %. No results found. No results for input(s): WBC, HGB, HCT, PLT in the last 72 hours. No results for input(s): NA, K, CL, CO2, GLUCOSE, BUN, CREATININE, CALCIUM in the last 72 hours.  Physical Exam: BP (!) 159/53 (BP Location: Right Arm)   Pulse (!) 52   Temp 97.6 F (36.4 C)   Resp 16   Ht 4\' 7"  (1.397 m)   Wt 56.3 kg   SpO2 100%   BMI 28.86 kg/m  Constitutional: No distress . Vital signs reviewed. HEENT: EOMI, oral membranes moist Neck: supple Cardiovascular: RRR without murmur. No JVD    Respiratory: CTA Bilaterally without wheezes or rales. Normal effort    GI: BS +, non-tender, non-distended  Musc: No edema or tenderness in extremities. Neurological: Alert and oriented Motor: B/l UE 5/5 proximal to distal, stable RLE: Hip flexion, knee extension 4-/5, ankle dorsiflexion 3+/5. LLE: HF, KE 3+/5, ADF ADF 1+ to 2/5. Whips right leg during swing phase. Steppage gait bilateral LE's Sensation diminished to light touch bilateral LE--stable Skin: Back incision CDI, glued--stable in appearance Psychiatric: pleasant.   Assessment/Plan: 1. Functional deficits secondary to cauda equina syndrome which require 3+ hours per day of interdisciplinary therapy in a comprehensive inpatient rehab setting.  Physiatrist is providing close team supervision and 24 hour management of active medical problems listed below.  Physiatrist and rehab team continue to assess barriers to discharge/monitor patient progress toward functional and medical goals  Care Tool:  Bathing  Bathing activity did not occur: Refused Body parts bathed by patient: Right arm, Left arm, Front  perineal area, Chest, Abdomen, Face, Left upper leg, Right lower leg, Left lower leg, Buttocks, Right upper leg   Body parts bathed by helper: Buttocks, Right upper leg, Left lower leg, Right lower leg, Left upper leg     Bathing assist Assist Level: Supervision/Verbal cueing     Upper Body Dressing/Undressing Upper body dressing   What is the patient wearing?: Pull over shirt, Orthosis    Upper body assist Assist Level: Supervision/Verbal cueing    Lower Body Dressing/Undressing Lower body dressing      What is the patient wearing?: Underwear/pull up, Pants     Lower body assist Assist for lower body dressing: Contact Guard/Touching assist     Toileting Toileting Toileting Activity did not occur (Clothing management and hygiene only): N/A (no void or bm)(i&o cath)  Toileting assist Assist for toileting: Minimal Assistance - Patient > 75%     Transfers Chair/bed transfer  Transfers assist     Chair/bed transfer assist level: Minimal Assistance - Patient > 75%     Locomotion Ambulation   Ambulation assist      Assist level: Minimal Assistance - Patient > 75% Assistive device: Walker-rolling Max distance: 45ft   Walk 10 feet activity   Assist  Walk 10 feet activity did not occur: Safety/medical concerns  Assist level: Minimal Assistance - Patient > 75% Assistive device: Walker-rolling   Walk 50 feet activity   Assist Walk 50 feet with 2 turns activity did not occur: Safety/medical concerns         Walk 150 feet activity   Assist Walk 150 feet activity did not occur:  Safety/medical concerns         Walk 10 feet on uneven surface  activity   Assist Walk 10 feet on uneven surfaces activity did not occur: Safety/medical concerns         Wheelchair     Assist Will patient use wheelchair at discharge?: Yes Type of Wheelchair: Manual    Wheelchair assist level: Supervision/Verbal cueing Max wheelchair distance: 150'     Wheelchair 50 feet with 2 turns activity    Assist        Assist Level: Supervision/Verbal cueing   Wheelchair 150 feet activity     Assist     Assist Level: Supervision/Verbal cueing      Medical Problem List and Plan: 1.  Gait abnormality, weakness, numbness secondary to cauda equina syndrome.  Continue CIR PT, OT    -ordered PRAFO LLE to help support foot in bed- pt finds this uncomfortable  -will need bilateral custom AFO's for gait 2.  Antithrombotics: -DVT/anticoagulation:  Mechanical: Sequential compression devices, below knee Bilateral lower extremities             -antiplatelet therapy: N/A 3. Pain Management: Oxycodone prn.    .   -cont lyrica 25mg  qhs , no nocturnal neuropathic pain  - prn tylenol, heat    4. Mood: LCSW to follow for evaluation and support.              -antipsychotic agents: N/A 5. Neuropsych: This patient is capable of making decisions on her  own behalf. 6. Skin/Wound Care: Monitor wound daily for healing.  7. Fluids/Electrolytes/Nutrition: Monitor I/O.   8. HTN: Monitor BP bid--continue Nifedipine daily with lopressor bid.   Systolic elevation 0/38- check ortho vitals prior to initiating another agent    9. Tachycardia:  resolved . 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 Urinary retention: per pt starting to void    UA equivocal, urine culture negative   -  emptying bladder more completely 11.  Bowel incontinence: seems to be improving  -observe for patterns  Continue bowel meds 12.  AKI  Creatinine 1.48 on 7/12, 1.43 7/13, trending down  Continue to encourage PO fluids--recheck next week 13.  Acute blood loss anemia  Hemoglobin 10 on 7.13 after transfusion of 1 unit PRBC on 7/11  LOS: 9 days A FACE TO FACE EVALUATION WAS PERFORMED  Charlett Blake 12/13/2018, 8:55 AM

## 2018-12-14 ENCOUNTER — Inpatient Hospital Stay (HOSPITAL_COMMUNITY): Payer: Medicare Other | Admitting: Physical Therapy

## 2018-12-14 ENCOUNTER — Inpatient Hospital Stay (HOSPITAL_COMMUNITY): Payer: Medicare Other | Admitting: Occupational Therapy

## 2018-12-14 LAB — BASIC METABOLIC PANEL
Anion gap: 11 (ref 5–15)
BUN: 37 mg/dL — ABNORMAL HIGH (ref 8–23)
CO2: 18 mmol/L — ABNORMAL LOW (ref 22–32)
Calcium: 9.5 mg/dL (ref 8.9–10.3)
Chloride: 109 mmol/L (ref 98–111)
Creatinine, Ser: 1.36 mg/dL — ABNORMAL HIGH (ref 0.44–1.00)
GFR calc Af Amer: 43 mL/min — ABNORMAL LOW (ref >60)
GFR calc non Af Amer: 37 mL/min — ABNORMAL LOW (ref >60)
Glucose, Bld: 149 mg/dL — ABNORMAL HIGH (ref 70–99)
Potassium: 3.9 mmol/L (ref 3.5–5.1)
Sodium: 138 mmol/L (ref 135–145)

## 2018-12-14 LAB — CBC
HCT: 34.4 % — ABNORMAL LOW (ref 36.0–46.0)
Hemoglobin: 10.6 g/dL — ABNORMAL LOW (ref 12.0–15.0)
MCH: 29.1 pg (ref 26.0–34.0)
MCHC: 30.8 g/dL (ref 30.0–36.0)
MCV: 94.5 fL (ref 80.0–100.0)
Platelets: 478 10*3/uL — ABNORMAL HIGH (ref 150–400)
RBC: 3.64 MIL/uL — ABNORMAL LOW (ref 3.87–5.11)
RDW: 13.5 % (ref 11.5–15.5)
WBC: 6.3 10*3/uL (ref 4.0–10.5)
nRBC: 0 % (ref 0.0–0.2)

## 2018-12-14 LAB — OCCULT BLOOD X 1 CARD TO LAB, STOOL: Fecal Occult Bld: NEGATIVE

## 2018-12-14 NOTE — Progress Notes (Signed)
Occupational Therapy Weekly Progress Note  Patient Details  Name: Paula Hartman MRN: [id number] Date of Birth: 09/19/1940  Beginning of progress report period: December 05, 2018 End of progress report period: December 14, 2018  Today's Date: 12/14/2018 OT Individual Time: 1030-1155 OT Individual Time Calculation (min): 85 min    Patient has met 3 of 3 short term goals.  Pt is making excellent progress towards OT goals. She requires min A overall for standing balance at RW during functional tasks. She is supervision for squat pivot transfers and min A for functional transfers using RW. She does require VCs for safety awareness due to some impulsivity. Also requires VCs for maintaining spinal pre-cautions during functional tasks, introducing AE to assist. Pt remains very motivated to return to PLOF and to cont to increase independence with ADLs.   Patient continues to demonstrate the following deficits: muscle weakness and muscle paralysis, decreased cardiorespiratoy endurance, decreased coordination, strength and decreased sitting balance, decreased standing balance, decreased postural control and decreased balance strategies and therefore will continue to benefit from skilled OT intervention to enhance overall performance with BADL and Reduce care partner burden.  Patient progressing toward long term goals..  Continue plan of care.  OT Short Term Goals Week 1:  OT Short Term Goal 1 (Week 1): Pt will perform toilet transfer at mod A consistently. OT Short Term Goal 1 - Progress (Week 1): Met OT Short Term Goal 2 (Week 1): Pt will perform LB dressing at mod A overall. OT Short Term Goal 2 - Progress (Week 1): Met OT Short Term Goal 3 (Week 1): Pt will perform toileting at mod A overall. OT Short Term Goal 3 - Progress (Week 1): Met Week 2:  OT Short Term Goal 1 (Week 2): STG=LTG due to LOS  Skilled Therapeutic Interventions/Progress Updates:    Pt seen for OT ADL bathing/dressing session and  session focuisng on functional standing balance/endurance. Pt in supine upon arrival, denying pain and requesting to shower.  She transferred to EOB with supervision using hospital bed functions. She completed stand pivot and squat pivot transfers throughout session, guarding assist for both with occasional VCs for technique and safety awareness.  She completed toileting task with steadying assist, ambulated ~32f with RW and min-mod A due to poor LE control. She bathed with supervision/set-up seated on tub transfer bench, lateral leans for buttock hygiene. She returned to w/c to dress, supervision to thread on all clothing and [facility] while she pulled pants ups. Introduced to shoe funnel and return demonstrated with donning B shoes with supervision while maintaining spinal pre-cautions.  She self propelled w/c throughout unit mod I. Set-up w/c in prep for transfer with min questioning cues for proper w/c parts management and set-up. Completed dynamic standing tasks standing at RW from EOM to address dynamic standing balance. Completed with min A overall with therapist facilitating L knee control to prevent hyperextending. Pt tolerating `3-4 minutes in standing each trial before seated rest break.  Pt returned to w/c at end of session and taken back to room. Pt transitioned to recliner and left seated with all needs in reach and chair pad alarm on.  Education provided throughout session regarding modified ADLs, back pre-cautions, energy conservation, decreased sensation due to SCI and functional implications and d/c planning.   Therapy Documentation Precautions:  Precautions Precautions: Back, Fall Precaution Comments: reviewed spinal precautions Required Braces or Orthoses: Spinal Brace Spinal Brace: Thoracolumbosacral orthotic Restrictions Weight Bearing Restrictions: No  Therapy/Group: Individual Therapy  Sheila Gervasi L 12/14/2018, 7:09 AM

## 2018-12-14 NOTE — Progress Notes (Signed)
Physical Therapy Weekly Progress Note  Patient Details  Name: Paula Hartman MRN: [id number] Date of Birth: 02-07-1941  Beginning of progress report period: December 05, 2018 End of progress report period: December 14, 2018  Today's Date: 12/14/2018 PT Individual Time: 0800-0900; 1445-1530 PT Individual Time Calculation (min): 60 min and 45 min   Patient has met 3 of 3 short term goals.  Pt is able to complete bed mobility with Supervision, squat pivot and stand pivot transfers with RW with min A, and demonstrates good recall of back precautions. Pt is able to ambulate up to 60 ft with RW and mod A with control for L knee due to hyperextension in stance phase. Pt is being assessed for a L AFO to assist with L LE clearance and knee control. Pt is also able to complete 3 x 3" stairs with min A.  Patient continues to demonstrate the following deficits muscle weakness, abnormal tone, unbalanced muscle activation and decreased coordination and decreased standing balance, decreased postural control and decreased balance strategies and therefore will continue to benefit from skilled PT intervention to increase functional independence with mobility.  Patient progressing toward long term goals..  Continue plan of care.  PT Short Term Goals Week 1:  PT Short Term Goal 1 (Week 1): Pt will complete least restrictive transfers with min A PT Short Term Goal 1 - Progress (Week 1): Met PT Short Term Goal 2 (Week 1): Pt will demonstrate recall of 3/3 back precautions PT Short Term Goal 2 - Progress (Week 1): Met PT Short Term Goal 3 (Week 1): Pt will perform bed mobility with min A consistently PT Short Term Goal 3 - Progress (Week 1): Met Week 2:  PT Short Term Goal 1 (Week 2): =LTG due to ELOS  Skilled Therapeutic Interventions/Progress Updates:  Session 1: Pt received supine in bed, agreeable to PT session. No complaints of pain. Bed mobility with Supervision with use of bedrail. Pt is setup A to don TLSO  while seated EOB. Squat pivot transfer bed to w/c with CGA. Manual w/c propulsion x 150 ft with use of BUE and Supervision. Pt requires min cueing for safe setup of w/c for transfer and for management of w/c parts. Squat pivot transfer w/c to/from therapy mat with CGA. Sit to stand with CGA to RW. Standing mini-squats 2 x 10 reps with focus on LLE control as pt hyperextends in standing. Ascend/descend 3 x 3" stairs forwards with 2 handrails and laterally with L handrail and min A. Kinetron seated hip ext 4 x 30 sec each and standing knee extension 4 x 30 sec each for BLE NMR and strengthening. Squat pivot transfer back to bed CGA. Sit to supine Supervision. Pt left semi-reclined in bed with needs in reach, bed alarm in place.  Session 2: Pt received seated in bed, agreeable to PT session. No complaints of pain. Bed mobility Supervision. Squat pivot transfer bed to w/c with CGA. Manual w/c propulsion x 150 ft with use of BUE and Supervision. Sit to stand with min A to RW. Ambulation 2 x 30 ft, 1 x 60 ft with RW and mod A with assist to control L knee in stance phase. Standing alt L/R 3" step taps with manual assist for L knee control, 2 x 15 reps. Squat pivot transfer back to bed CGA. Assisted pt with finding a menu and calling kitchen to order future meals. Pt left seated in bed with needs in reach, bed alarm in place.  Therapy Documentation  Precautions:  Precautions Precautions: Back, Fall Precaution Comments: reviewed spinal precautions Required Braces or Orthoses: Spinal Brace Spinal Brace: Thoracolumbosacral orthotic Restrictions Weight Bearing Restrictions: No   Therapy/Group: Individual Therapy   Excell Seltzer, PT, DPT  12/14/2018, 3:43 PM

## 2018-12-14 NOTE — Progress Notes (Signed)
[place] PHYSICAL MEDICINE & REHABILITATION PROGRESS NOTE  Subjective/Complaints: Had a good weekend. No new issues over the weekend. Feels that lyrica helped pain and sleep without sig side effects. Doesn't like PRAFo, too big, hot  ROS: Patient denies fever, rash, sore throat, blurred vision, nausea, vomiting, diarrhea, cough, shortness of breath or chest pain,  headache, or mood change.   Objective: Vital Signs: Blood pressure (!) 156/57, pulse (!) 58, temperature 98 F (36.7 C), temperature source Oral, resp. rate 16, height 4\' 7"  (1.397 m), weight 56.3 kg, SpO2 99 %. No results found. Recent Labs    12/14/18 0725  WBC 6.3  HGB 10.6*  HCT 34.4*  PLT 478*   Recent Labs    12/14/18 0725  NA 138  K 3.9  CL 109  CO2 18*  GLUCOSE 149*  BUN 37*  CREATININE 1.36*  CALCIUM 9.5    Physical Exam: BP (!) 156/57 (BP Location: Right Arm)   Pulse (!) 58   Temp 98 F (36.7 C) (Oral)   Resp 16   Ht 4\' 7"  (1.397 m)   Wt 56.3 kg   SpO2 99%   BMI 28.86 kg/m  Constitutional: No distress . Vital signs reviewed. HEENT: EOMI, oral membranes moist Neck: supple Cardiovascular: RRR without murmur. No JVD    Respiratory: CTA Bilaterally without wheezes or rales. Normal effort    GI: BS +, non-tender, non-distended  Musc: No edema or tenderness in extremities. Neurological: Alert and oriented Motor: B/l UE 5/5 proximal to distal, stable RLE: Hip flexion, knee extension 4-/5, ankle dorsiflexion 3+/5. LLE: HF, KE 3+/5, APF / ADF 2 to 2+ Sensation diminished to light touch bilateral LE--stable Skin: Back incision CDI, glued--dry Psychiatric: pleasant.   Assessment/Plan: 1. Functional deficits secondary to cauda equina syndrome which require 3+ hours per day of interdisciplinary therapy in a comprehensive inpatient rehab setting.  Physiatrist is providing close team supervision and 24 hour management of active medical problems listed below.  Physiatrist and rehab team continue  to assess barriers to discharge/monitor patient progress toward functional and medical goals  Care Tool:  Bathing  Bathing activity did not occur: Refused Body parts bathed by patient: Right arm, Left arm, Front perineal area, Chest, Abdomen, Face, Left upper leg, Right lower leg, Left lower leg, Buttocks, Right upper leg   Body parts bathed by helper: Buttocks, Right upper leg, Left lower leg, Right lower leg, Left upper leg     Bathing assist Assist Level: Supervision/Verbal cueing     Upper Body Dressing/Undressing Upper body dressing   What is the patient wearing?: Pull over shirt, Orthosis    Upper body assist Assist Level: Supervision/Verbal cueing    Lower Body Dressing/Undressing Lower body dressing      What is the patient wearing?: Underwear/pull up, Pants     Lower body assist Assist for lower body dressing: Contact Guard/Touching assist     Toileting Toileting Toileting Activity did not occur (Clothing management and hygiene only): N/A (no void or bm)(i&o cath)  Toileting assist Assist for toileting: Minimal Assistance - Patient > 75%     Transfers Chair/bed transfer  Transfers assist     Chair/bed transfer assist level: Minimal Assistance - Patient > 75%     Locomotion Ambulation   Ambulation assist      Assist level: Moderate Assistance - Patient 50 - 74% Assistive device: Orthosis(ACE L knee) Max distance: 25   Walk 10 feet activity   Assist  Walk 10 feet activity did  not occur: Safety/medical concerns  Assist level: Moderate Assistance - Patient - 50 - 74% Assistive device: Walker-rolling   Walk 50 feet activity   Assist Walk 50 feet with 2 turns activity did not occur: Safety/medical concerns         Walk 150 feet activity   Assist Walk 150 feet activity did not occur: Safety/medical concerns         Walk 10 feet on uneven surface  activity   Assist Walk 10 feet on uneven surfaces activity did not occur:  Safety/medical concerns         Wheelchair     Assist Will patient use wheelchair at discharge?: Yes Type of Wheelchair: Manual    Wheelchair assist level: Supervision/Verbal cueing Max wheelchair distance: 60    Wheelchair 50 feet with 2 turns activity    Assist        Assist Level: Supervision/Verbal cueing   Wheelchair 150 feet activity     Assist     Assist Level: Supervision/Verbal cueing      Medical Problem List and Plan: 1.  Gait abnormality, weakness, numbness secondary to cauda equina syndrome.  Continue CIR PT, OT    -will see if we can get a smaller PRAFO LLE  -will need bilateral custom AFO's for gait 2.  Antithrombotics: -DVT/anticoagulation:  Mechanical: Sequential compression devices, below knee Bilateral lower extremities             -antiplatelet therapy: N/A 3. Pain Management: Oxycodone prn.    .   -cont lyrica 25mg  qhs ---seems to have helped  - prn tylenol, heat    4. Mood: LCSW to follow for evaluation and support.              -antipsychotic agents: N/A 5. Neuropsych: This patient is capable of making decisions on her  own behalf. 6. Skin/Wound Care: Monitor wound daily for healing.  7. Fluids/Electrolytes/Nutrition: Monitor I/O.   8. HTN: Monitor BP bid--continue Nifedipine daily with lopressor bid.     -borderline to fair control---no changes   9. Tachycardia:  resolved . 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 Urinary retention:    UA equivocal, urine culture negative   -  emptying bladder more completely 11.  Bowel incontinence:    -observe for patterns  Continue bowel meds --moving bowels 12.  AKI  Creatinine 1.48 on 7/12, 1.43 7/13, trending down  Continue to encourage PO fluids--recheck next week 13.  Acute blood loss anemia  Hemoglobin 10 on 7.13 after  transfusion of 1 unit PRBC on 7/11  LOS: 10 days A FACE TO FACE EVALUATION WAS PERFORMED  Meredith Staggers 12/14/2018, 10:19 AM

## 2018-12-15 ENCOUNTER — Inpatient Hospital Stay (HOSPITAL_COMMUNITY): Payer: Medicare Other | Admitting: Physical Therapy

## 2018-12-15 ENCOUNTER — Inpatient Hospital Stay (HOSPITAL_COMMUNITY): Payer: Medicare Other | Admitting: Occupational Therapy

## 2018-12-15 LAB — OCCULT BLOOD X 1 CARD TO LAB, STOOL: Fecal Occult Bld: NEGATIVE

## 2018-12-15 NOTE — Progress Notes (Signed)
Recreational Therapy Session Note  Patient Details  Name: Paula Hartman MRN: [id number] Date of Birth: [birth year] Today's Date: 12/15/2018 Time  1005-1048 Pain: no c/o Skilled Therapeutic Interventions/Progress Updates: Session focused on activity tolerance, standing tolerance, dynamic standing balance & safety awareness during co-treat with PT.  Pt propelled w/c using BUE's with supervision from her room to the dayroom.  After seated rest break, pt participated in corn hole game with contact guard assist for balance.  Pt did require min cues for LE positioning and for knee hyperextension.  Pt also stood at hi-low table for card game requiring BUE's with min assist for balance.  Pt required frequent seated rest breaks during standing activities (~every 1-2 miutes) due to fatigue.  Therapy/Group: Co-Treatment  Hodari Chuba 12/15/2018, 10:08 AM

## 2018-12-15 NOTE — Progress Notes (Signed)
Occupational Therapy Session Note  Patient Details  Name: Paula Hartman MRN: [id number] Date of Birth: 08/18/1940  Today's Date: 12/15/2018 OT Individual Time: 1400-1430 OT Individual Time Calculation (min): 30 min    Short Term Goals: Week 1:  OT Short Term Goal 1 (Week 1): Pt will perform toilet transfer at mod A consistently. OT Short Term Goal 1 - Progress (Week 1): Met OT Short Term Goal 2 (Week 1): Pt will perform LB dressing at mod A overall. OT Short Term Goal 2 - Progress (Week 1): Met OT Short Term Goal 3 (Week 1): Pt will perform toileting at mod A overall. OT Short Term Goal 3 - Progress (Week 1): Met Week 2:  OT Short Term Goal 1 (Week 2): STG=LTG due to LOS  Skilled Therapeutic Interventions/Progress Updates:    1:1 Focus on functional mobility with attention to right knee control. Performed kinetron on medium resistance in standing with manual facilitation to keep right knee at midline for strengthening. Performed navigating through obstacle course with RW with close supervision at slow speed to control knee. Then transitioned to toe taps onto a 3 inch step with focus on control and weight shifts.   Pt transferred back into bed to rest with supervision with bed alarm on.   Therapy Documentation Precautions:  Precautions Precautions: Back, Fall Precaution Comments: reviewed spinal precautions Required Braces or Orthoses: Spinal Brace Spinal Brace: Thoracolumbosacral orthotic Restrictions Weight Bearing Restrictions: No General:   Vital Signs: Therapy Vitals Temp: 98.3 F (36.8 C) Pulse Rate: 63 Resp: 18 BP: (!) 110/44 Patient Position (if appropriate): Lying Oxygen Therapy SpO2: 98 % O2 Device: Room Air Pain:  no c/o pain   Therapy/Group: Individual Therapy  Smith, Jennifer Lynsey 12/15/2018, 7:18 PM 

## 2018-12-15 NOTE — Progress Notes (Signed)
[place] PHYSICAL MEDICINE & REHABILITATION PROGRESS NOTE  Subjective/Complaints: No new issues. Had question about "suture" in back  ROS: Patient denies fever, rash, sore throat, blurred vision, nausea, vomiting, diarrhea, cough, shortness of breath or chest pain,   headache, or mood change.    Objective: Vital Signs: Blood pressure (!) 150/55, pulse (!) 55, temperature 97.9 F (36.6 C), resp. rate 14, height 4\' 7"  (1.397 m), weight 56.3 kg, SpO2 98 %. No results found. Recent Labs    12/14/18 0725  WBC 6.3  HGB 10.6*  HCT 34.4*  PLT 478*   Recent Labs    12/14/18 0725  NA 138  K 3.9  CL 109  CO2 18*  GLUCOSE 149*  BUN 37*  CREATININE 1.36*  CALCIUM 9.5    Physical Exam: BP (!) 150/55 (BP Location: Right Arm)   Pulse (!) 55   Temp 97.9 F (36.6 C)   Resp 14   Ht 4\' 7"  (1.397 m)   Wt 56.3 kg   SpO2 98%   BMI 28.86 kg/m  Constitutional: No distress . Vital signs reviewed. HEENT: EOMI, oral membranes moist Neck: supple Cardiovascular: RRR without murmur. No JVD    Respiratory: CTA Bilaterally without wheezes or rales. Normal effort    GI: BS +, non-tender, non-distended  Musc: No edema or tenderness in extremities. Neurological: Alert and oriented Motor: B/l UE 5/5 proximal to distal, stable RLE: Hip flexion, knee extension 4-/5, ankle dorsiflexion 3+/5. LLE: HF, KE 3+/5, APF / ADF 2 to 2+--stable motor Sensation diminished to light touch bilateral LE--stable Skin: Back incision CDI, glued--dry, has one piece of suture sticking out of skin Psychiatric: pleasant.   Assessment/Plan: 1. Functional deficits secondary to cauda equina syndrome which require 3+ hours per day of interdisciplinary therapy in a comprehensive inpatient rehab setting.  Physiatrist is providing close team supervision and 24 hour management of active medical problems listed below.  Physiatrist and rehab team continue to assess barriers to discharge/monitor patient progress toward  functional and medical goals  Care Tool:  Bathing  Bathing activity did not occur: Refused Body parts bathed by patient: Right arm, Left arm, Front perineal area, Chest, Abdomen, Face, Left upper leg, Right lower leg, Left lower leg, Buttocks, Right upper leg   Body parts bathed by helper: Buttocks, Right upper leg, Left lower leg, Right lower leg, Left upper leg     Bathing assist Assist Level: Supervision/Verbal cueing     Upper Body Dressing/Undressing Upper body dressing   What is the patient wearing?: Pull over shirt, Orthosis Orthosis activity level: Performed by helper  Upper body assist Assist Level: Supervision/Verbal cueing    Lower Body Dressing/Undressing Lower body dressing      What is the patient wearing?: Underwear/pull up, Pants     Lower body assist Assist for lower body dressing: Minimal Assistance - Patient > 75%     Toileting Toileting Toileting Activity did not occur (Clothing management and hygiene only): N/A (no void or bm)(i&o cath)  Toileting assist Assist for toileting: Minimal Assistance - Patient > 75%     Transfers Chair/bed transfer  Transfers assist     Chair/bed transfer assist level: Minimal Assistance - Patient > 75%     Locomotion Ambulation   Ambulation assist      Assist level: Moderate Assistance - Patient 50 - 74% Assistive device: Walker-rolling Max distance: 60'   Walk 10 feet activity   Assist  Walk 10 feet activity did not occur: Safety/medical concerns  Assist level: Moderate Assistance - Patient - 50 - 74% Assistive device: Walker-rolling   Walk 50 feet activity   Assist Walk 50 feet with 2 turns activity did not occur: Safety/medical concerns  Assist level: Moderate Assistance - Patient - 50 - 74% Assistive device: Walker-rolling    Walk 150 feet activity   Assist Walk 150 feet activity did not occur: Safety/medical concerns         Walk 10 feet on uneven surface  activity   Assist  Walk 10 feet on uneven surfaces activity did not occur: Safety/medical concerns         Wheelchair     Assist Will patient use wheelchair at discharge?: Yes Type of Wheelchair: Manual    Wheelchair assist level: Supervision/Verbal cueing Max wheelchair distance: 150'    Wheelchair 50 feet with 2 turns activity    Assist        Assist Level: Supervision/Verbal cueing   Wheelchair 150 feet activity     Assist     Assist Level: Supervision/Verbal cueing      Medical Problem List and Plan: 1.  Gait abnormality, weakness, numbness secondary to cauda equina syndrome.  Continue CIR PT, OT, team conference today    -requested smaller PRAFO LLE (not delivered yet)  -will need bilateral? custom AFO's for gait 2.  Antithrombotics: -DVT/anticoagulation:  Mechanical: Sequential compression devices, below knee Bilateral lower extremities             -antiplatelet therapy: N/A 3. Pain Management: Oxycodone prn.    .   -cont lyrica 25mg  qhs ---seems to have helped  - prn tylenol, heat    4. Mood: LCSW to follow for evaluation and support.              -antipsychotic agents: N/A 5. Neuropsych: This patient is capable of making decisions on her  own behalf. 6. Skin/Wound Care: Monitor wound daily for healing.  7. Fluids/Electrolytes/Nutrition: Monitor I/O.   8. HTN: Monitor BP bid--continue Nifedipine daily with lopressor bid.     -borderline to fair control--stable 9. Tachycardia:  resolved . 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 Urinary retention:    UA equivocal, urine culture negative   -  emptying bladder more completely 11.  Bowel incontinence:    -observe for patterns  Continue bowel meds --moving bowels 12.  AKI  Creatinine 1.36 7/20  Continue to encourage PO fluids   13.  Acute blood loss  anemia  Hemoglobin 10.6 7/20  -recent transfusion 1u PRBC  LOS: 11 days A FACE TO FACE EVALUATION WAS PERFORMED  Meredith Staggers 12/15/2018, 10:27 AM

## 2018-12-15 NOTE — Plan of Care (Signed)
  Problem: Consults Goal: RH GENERAL PATIENT EDUCATION Description: See Patient Education module for education specifics. Outcome: Progressing Goal: Skin Care Protocol Initiated - if Braden Score 18 or less Description: If consults are not indicated, leave blank or document N/A Outcome: Progressing   Problem: RH BOWEL ELIMINATION Goal: RH STG MANAGE BOWEL WITH ASSISTANCE Description: STG Manage Bowel with mod I Assistance. Outcome: Progressing   Problem: RH BLADDER ELIMINATION Goal: RH STG MANAGE BLADDER WITH ASSISTANCE Description: STG Manage Bladder With mod I Assistance Outcome: Progressing   Problem: RH SKIN INTEGRITY Goal: RH STG SKIN FREE OF INFECTION/BREAKDOWN Outcome: Progressing Goal: RH STG MAINTAIN SKIN INTEGRITY WITH ASSISTANCE Description: STG Maintain Skin Integrity With mod I Assistance. Outcome: Progressing   Problem: RH SAFETY Goal: RH STG ADHERE TO SAFETY PRECAUTIONS W/ASSISTANCE/DEVICE Description: STG Adhere to Safety Precautions With mod I Assistance/Device. Outcome: Progressing   Problem: RH PAIN MANAGEMENT Goal: RH STG PAIN MANAGED AT OR BELOW PT'S PAIN GOAL Description: Pain level less that 4 on scale of 0-10 Outcome: Progressing   Problem: RH KNOWLEDGE DEFICIT GENERAL Goal: RH STG INCREASE KNOWLEDGE OF SELF CARE AFTER HOSPITALIZATION Outcome: Progressing   Problem: Consults Goal: RH SPINAL CORD INJURY PATIENT EDUCATION Description:  See Patient Education module for education specifics.  Outcome: Progressing   Problem: SCI BOWEL ELIMINATION Goal: RH STG MANAGE BOWEL WITH ASSISTANCE Description: STG Manage Bowel with mod I Assistance. Outcome: Progressing Goal: RH STG SCI MANAGE BOWEL PROGRAM W/ASSIST OR AS APPROPRIATE Description: STG SCI Manage bowel program with mod I assist or as appropriate. Outcome: Progressing   Problem: SCI BLADDER ELIMINATION Goal: RH STG MANAGE BLADDER WITH ASSISTANCE Description: STG Manage Bladder With mod I  Assistance Outcome: Progressing   Problem: RH SKIN INTEGRITY Goal: RH STG MAINTAIN SKIN INTEGRITY WITH ASSISTANCE Description: STG Maintain Skin Integrity With mod I Assistance. Outcome: Progressing   Problem: RH SAFETY Goal: RH STG ADHERE TO SAFETY PRECAUTIONS W/ASSISTANCE/DEVICE Description: STG Adhere to Safety Precautions With cues and reminder Assistance/Device. Outcome: Progressing   Problem: RH PAIN MANAGEMENT Goal: RH STG PAIN MANAGED AT OR BELOW PT'S PAIN GOAL Description: Pain scale <4/10 Outcome: Progressing   Problem: RH KNOWLEDGE DEFICIT SCI Goal: RH STG INCREASE KNOWLEDGE OF SELF CARE AFTER SCI Outcome: Progressing

## 2018-12-15 NOTE — Progress Notes (Signed)
Occupational Therapy Session Note  Patient Details  Name: Vici Novick MRN: [id number] Date of Birth: 1940-07-19  Today's Date: 12/15/2018 OT Individual Time: 3785-8850 OT Individual Time Calculation (min): 80 min    Short Term Goals: Week 2:  OT Short Term Goal 1 (Week 2): STG=LTG due to LOS  Skilled Therapeutic Interventions/Progress Updates:    Pt seen for OT session focusing on functional transfers and w/c parts management. Pt in w/c upon arrival, gathering items for daughter to take home. Pt provided with reacher and education/demonstration provided regarding use. Pt was able to gather needed items from low drawers using reacher while maintaining pre-cautions. She was able to recall 2/3 back pre-cautions today and required VCs throughout session to maintain pre-cautions during functional tasks. W/C propulsion throughout unit completed mod I.  Simulated tub/shower transfer utilizing tub transfer bench completed with guarding assist via squat pivot to bench. Pt able to manage B LEs over tub wall with supervision.  W/c<> low soft surface couch via squat pivot with guarding assist Stand pivot transfer w/c <> EOM x2 trials with min A and VCs for technique.  W/c<> EOB with close supervision EOB<> drop arm BSC with guarding assist. She was able to complete lateral leans/ modified stand in order to complete clothing management with close supervision.  During each transfer, emphasized pt independently setting up all aspects of w/c in prep for transfer, required mod cuing initially fading to 1 VC by end of session.  Pt ambulated ~31ft from w/c to recliner at end of session with min A overall using RW.  Pt left seated in recliner at end of session, all needs in reach and chair pad alarm on.  Education provided throughout session regarding w/c parts management, transfer techniques and set-up, DME, and d/c planning.   Therapy Documentation Precautions:  Precautions Precautions: Back,  Fall Precaution Comments: reviewed spinal precautions Required Braces or Orthoses: Spinal Brace Spinal Brace: Thoracolumbosacral orthotic Restrictions Weight Bearing Restrictions: No Pain:   No/denies pain   Therapy/Group: Individual Therapy  Tiah Heckel L 12/15/2018, 7:16 AM

## 2018-12-15 NOTE — Progress Notes (Signed)
Physical Therapy Session Note  Patient Details  Name: Paula Hartman MRN: [id number] Date of Birth: 1941/01/01  Today's Date: 12/15/2018 PT Individual Time: 0900-0955 PT Individual Time Calculation (min): 55 min   Short Term Goals: Week 2:  PT Short Term Goal 1 (Week 2): =LTG due to ELOS  Skilled Therapeutic Interventions/Progress Updates:   Pt in supine and agreeable to therapy, no c/o pain. Supine>sit w/ supervision. Total assist to don shoes, min assist to don TLSO. Min assist stand pivot to w/c. Pt requesting to toilet. Min assist toilet transfer w/ min assist for static standing balance while pt utilizing BUEs for pericare and LE garment management. Pt self-propelled w/c to/from therapy gym w/ supervision using BUEs. Worked on standing endurance/balance this session while engaging in games. Stood to play cornhole and to play cards at high/low table. Able to stand in 30-60 sec bouts w/ primarily CGA for safety, up to min assist when pt utilized BUEs for task. Encouragement to use primarily LEs for standing balance. Performed many bouts of standing w/ rest breaks 2/2 fatigue. Returned to room via w/c, ended session in w/c and all needs in reach.   Therapy Documentation Precautions:  Precautions Precautions: Back, Fall Precaution Comments: reviewed spinal precautions Required Braces or Orthoses: Spinal Brace Spinal Brace: Thoracolumbosacral orthotic Restrictions Weight Bearing Restrictions: No  Therapy/Group: Individual Therapy  Derriana Oser Clent Demark 12/15/2018, 9:55 AM

## 2018-12-15 NOTE — Progress Notes (Signed)
Physical Therapy Session Note  Patient Details  Name: Paula Hartman MRN: [id number] Date of Birth: 02-06-1941  Today's Date: 12/15/2018 PT Individual Time: 1430-1530 PT Individual Time Calculation (min): 60 min   Short Term Goals: Week 2:  PT Short Term Goal 1 (Week 2): =LTG due to ELOS  Skilled Therapeutic Interventions/Progress Updates:    Pt received seated in bed with AFO consultant from [employer] present for fitting of custom AFO. Assisted pt with donning AFO and shoes. Pt unable to tolerate custom AFO due to it cutting into her medial L ankle, consultant to take AFO back to adjust fit and return tomorrow. Pt has no other complaints of pain and agreeable to participate in therapy session. Bed mobility Supervision. Squat pivot transfer w/c to/from chair with CGA. Manual w/c propulsion x 150 ft with use of BUE and Supervision. Ascend/descend 4 x 6" stairs forwards with B handrails and min A. Ascend/descend 4 x 6" stairs laterally with L handrail and min A to simulate home environment. Education with pt about taking smaller steps on stairs and breaking them up into more steps for improved safety and stability. Standing BLE NMR training with RW and min A for balance: alt L/R target taps; cone taps with circumduction of BLE noted; 1" step taps with manual assist to prevent L knee hyperextension in stance. Standing L TKE with orange theraband 2 x 10 reps. Assisted pt back to bed at end of session, Supervision. Pt left semi-reclined in bed with needs in reach, bed alarm in place.  Therapy Documentation Precautions:  Precautions Precautions: Back, Fall Precaution Comments: reviewed spinal precautions Required Braces or Orthoses: Spinal Brace Spinal Brace: Thoracolumbosacral orthotic Restrictions Weight Bearing Restrictions: No    Therapy/Group: Individual Therapy   Excell Seltzer, PT, DPT  12/15/2018, 3:49 PM

## 2018-12-16 ENCOUNTER — Inpatient Hospital Stay (HOSPITAL_COMMUNITY): Payer: Medicare Other | Admitting: *Deleted

## 2018-12-16 ENCOUNTER — Inpatient Hospital Stay (HOSPITAL_COMMUNITY): Payer: Medicare Other | Admitting: Physical Therapy

## 2018-12-16 ENCOUNTER — Inpatient Hospital Stay (HOSPITAL_COMMUNITY): Payer: Medicare Other | Admitting: Occupational Therapy

## 2018-12-16 NOTE — Progress Notes (Signed)
Recreational Therapy Session Note  Patient Details  Name: Paula Hartman MRN: [id number] Date of Birth: [birth year] Today's Date: 12/16/2018 Time:  930-10 Pain: no c/o Skilled Therapeutic Interventions/Progress Updates: Session focused on discharge planning, use of leisure time at discharge, activity modifications.  Pt is anxious to go home and return to previously enjoyed activities.  Pt able to identify safe activities and identify modifications with min cues.  Therapy/Group: Individual Therapy   Herley Bernardini 12/16/2018, 4:06 PM

## 2018-12-16 NOTE — Patient Care Conference (Signed)
Inpatient RehabilitationTeam Conference and Plan of Care Update Date: 12/15/2018   Time: 2:25 PM    Patient Name: Paula Hartman      Medical Record Number: [id number]  Date of Birth: Mar 09, 1941 Sex: Female         Room/Bed: 4W17C/4W17C-01 Payor Info: Payor: MEDICARE / Plan: MEDICARE PART A AND B / Product Type: *No Product type* /    Admitting Diagnosis: 1. SCI Team  Other NTSC dysf; Cauda equine syndrome; 15-17days  Admit Date/Time:  12/04/2018  4:42 PM Admission Comments: No comment available   Primary Diagnosis:  <principal problem not specified> Principal Problem: <principal problem not specified>  Patient Active Problem List   Diagnosis Date Noted  . Labile blood pressure   . Acute blood loss anemia   . AKI (acute kidney injury) ([place])   . Benign essential HTN   . Cauda equina syndrome ([place]) 12/04/2018  . Lumbar disc herniation   . Incontinence of feces   . Urinary retention   . Sinus tachycardia   . Neuropathic pain   . Postoperative pain   . Essential hypertension   . Dyslipidemia   . Radiculopathy 12/02/2018  . ESSENTIAL HYPERTENSION 12/28/2009  . Allergic rhinitis 12/28/2009  . 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 RENAL INSUFFICIENCY, CHRONIC 12/28/2009    Expected Discharge Date: Expected Discharge Date: 12/22/18  Team Members Present: Physician leading conference: Dr. Alger Simons Social Worker Present: Alfonse Alpers, LCSW Nurse Present: Mohammed Kindle, RN PT Present: Excell Seltzer, PT OT Present: Amy Rounds, OT SLP Present: Charolett Bumpers, SLP PPS Coordinator present : Gunnar Fusi, SLP     Current Status/Progress Goal Weekly Team Focus  Medical   pain improved. tolerating lyrica. still with sensory loss and L>R foot drop.  improve functional mobility  brace wear, pain mgt   Bowel/Bladder   Continent b/b. PVR q4-6 hours. LBM 7/20/220  Remain continent.  Assess toileting needs q shift and prn.   Swallow/Nutrition/ Hydration              ADL's   Set-up bathing from seated position in shower, CGA stand pivot transfers with RW, supervision squat pivot transfers; min A LB dressing, min A dynamic standing balance  supervision overall  ADL re-training, functional standing balance/endurance, family education and d/c planning   Mobility   S bed mob, CGA squat pivot transfer, mod A gait up to 62' with RW, S w/c mobility, min A 3" stairs  min A gait with PT only, supervision transfers and w/c mobility  LE NMR, gait training, stairs   Communication             Safety/Cognition/ Behavioral Observations            Pain   no c/o pain. Available Tylenol 650mg  scheduled, Percocet 5-325 mg PRN  2/10  Assess pain q shift and prn, medicate as necessary   Skin   Surgical incision to mid lower back, Open to air, no s/s of infection  Skin remain intact free from infection and breakdown  assess skin q shift and prn    Rehab Goals Patient on target to meet rehab goals: Yes Rehab Goals Revised: none *See Care Plan and progress notes for long and short-term goals.     Barriers to Discharge  Current Status/Progress Possible Resolutions Date Resolved   Physician    Medical stability               Nursing  PT                    OT                  SLP                SW                Discharge Planning/Teaching Needs:  Pt plans to return to her dtr's home where she was living prior to admission.  She has a flight of stairs to her bedroom, so pt plans to stay downstairs for a while.  Pt's dtr to come in for family education prior to d/c.   Team Discussion:  Pt having good neuro recovery.  Her PRAFO does not fit, Dr. Naaman Plummer requested a smaller size, but pt does not really need to use it unless smaller size comes.  Her pain is better and bowels and bladder are good.  Pt does not c/o pain as much.  She is continent.  Pt making great progress with both PT and OT and is close S/steady A right now, with hopes of upgrading  to mod I for many tasks.  Pt is still min/mod A with 50' gait.  She is to receive custom AFO soon and has started stairs (min A).  Revisions to Treatment Plan:  none    Continued Need for Acute Rehabilitation Level of Care: The patient requires daily medical management by a physician with specialized training in physical medicine and rehabilitation for the following conditions: Daily direction of a multidisciplinary physical rehabilitation program to ensure safe treatment while eliciting the highest outcome that is of practical value to the patient.: Yes Daily medical management of patient stability for increased activity during participation in an intensive rehabilitation regime.: Yes Daily analysis of laboratory values and/or radiology reports with any subsequent need for medication adjustment of medical intervention for : Neurological problems;Urological problems   I attest that I was present, lead the team conference, and concur with the assessment and plan of the team.Team conference was held via web/ teleconference due to [place] - 19.   Hubert Derstine, Silvestre Mesi 12/16/2018, 1:50 PM

## 2018-12-16 NOTE — Progress Notes (Signed)
    Patient progressing well in CIR 2 weeks PO sx for cauda equina. She reports return of normal bowel and bladder function and sensation. She cont to have numbness in her LE's that she feels is slowly improving. She reports improvements in her gait and is ambulating with a walker. She has found the ankle and knee braces beneficial. She is eager to progress home to her daughter.    Physical Exam: Vitals:   12/15/18 1953 12/16/18 0530  BP: (!) 118/48 (!) 153/52  Pulse: 77 (!) 58  Resp: 18 18  Temp: 98.2 F (36.8 C) 97.8 F (36.6 C)  SpO2: 97% 99%    Incision well healed,no erythema or drainage, can be treated like normal skin, dissolvable external suture can be clipped if irritating to pt.  NVI RLE: Hip flexion, knee extension 4-/5, ankle dorsiflexion 3+/5.  LLE: HF, KE 3+/5, APF / ADF 2 to 2+  PO 2 weeks s/p decompression and fusion for cauda equina with steady progress in CIR  - up with PT/OT, encourage ambulation  - Plan to D/C home from CIR in next week   - Cont TLSO brace at all times when OOB, no BLT or Lift >10lbs - Tylenol PRN for pain - F/U in office in 4 weeks at 6 weeks PO

## 2018-12-16 NOTE — Progress Notes (Signed)
Occupational Therapy Session Note  Patient Details  Name: Paula Hartman MRN: [id number] Date of Birth: Nov 10, 1940  Today's Date: 12/16/2018 OT Individual Time: 7989-2119 and 1300-1355 OT Individual Time Calculation (min): 70 min and 55 min   Short Term Goals: Week 2:  OT Short Term Goal 1 (Week 2): STG=LTG due to LOS  Skilled Therapeutic Interventions/Progress Updates:    Session One: Pt seen for OT session focusing on functional mobility and accessibility. Pt sitting up in w/c upon arrival, agreeable to tx session and denying pain.  New custom AFO delivered. Donned with total A, very hard fit into shoe. Spoke with pt's daughter about purchasing shoe .5 size bigger to ease donning and promote pt tolerance to wearing.  Pt declined need for bathing/dressing and ADLs this morning, opting to complete during PM session.  She self propelled w/c throughout unit mod I. In therapy gym, completed functional ambulation trials weaving through cones in simulation of more home like environment. Completed with CGA- close supervision. Multi-modal cuing to facilitate upright standing posture while also decreasing UE reliance. Pt still noted to be hyper extending L knee. VCs to shorten step length and pt demonstrating better knee control with this. Also placed mirror in front of pt during walking for visual feedback of knee control as pt reports she is unable to feel knee hyper extending. She completed x3 ambulation trials each ~86ft with seated rest break btwn trails.  Pt returned to w/c and self propelled w/c to ADL kitchen. Education/demonstration and discussion regarding kitchen mobility, accessibility, and navigating through kitchen from both w/c level and when using RW. Pt will benefit from actual practice/accessibility in future session. She returned to room at end of session, ambulated from doorway to recliner with guarding assist using RW. Pt left seated in recliner at end of session, chair pad alarm  on and all needs in reach.  Education provided throughout session regarding modified ADLs/IADLs, back pre-cautions, SCI, purpose and function of AFO and d/c planning.   Session Two: Pt seen for OT ADL bathing/dressing session. Pt sitting upright in w/c upon arrival, agreeable to tx session and denying pain. Pt requesting to shower this session.  She ambulated with RW into bathroom with RW and CGA. She bathed seated on tub transfer bench with set-up and intermittent supervision, lateral leans for buttock hygiene. She exited shower with CGA and VCs for technique of stand pivot with use of grab bar. She dressed seated in w/c, indep UB dressing. She threaded B LEs into pants/underwear independently, maintaining spinal pre-cautions without use of AE. She stood at [facility] with min A overall for dynamic standing balance to pull pants up able to maintain balance without UE support during task. She completed hair care routine and other grooming tasks from w/c level with set-up for energy conservation.  Completed ambulation within hallway, ambulating from nurses station back to her room with min A overall, VCs to relax UE reliance on RW and awareness to L foot placement. Pt requesting to return to supine to rest at end of session, returned to supine with supervision and left with all needs in reach and bed alarm on.   Therapy Documentation Precautions:  Precautions Precautions: Back, Fall Precaution Comments: reviewed spinal precautions Required Braces or Orthoses: Spinal Brace Spinal Brace: Thoracolumbosacral orthotic Restrictions Weight Bearing Restrictions: No Pain:   No/denies pain   Therapy/Group: Individual Therapy  Maikel Neisler L 12/16/2018, 6:51 AM

## 2018-12-16 NOTE — Progress Notes (Signed)
Physical Therapy Session Note  Patient Details  Name: Paula Hartman MRN: [id number] Date of Birth: Mar 18, 1941  Today's Date: 12/16/2018 PT Individual Time: 1045-1200 PT Individual Time Calculation (min): 75 min   Short Term Goals: Week 2:  PT Short Term Goal 1 (Week 2): =LTG due to ELOS  Skilled Therapeutic Interventions/Progress Updates:    Pt received seated in bed, agreeable to PT session. No complaints of pain. Bed mobility Supervision. Min A to don LSO. Squat pivot transfer bed to w/c with Supervision. Manual w/c propulsion x 150 ft with use of BUE and Supervision. Pt is setup A for management of w/c parts. Sit to stand with CGA to RW. Ambulation 3 x 100 ft with RW and close Supervision to CGA with use of L AFO. Orthotist here to assess gait with AFO. Pt does continue to exhibit some L knee hyperextension with gait, orthotist to further adjust PF stop on brace to prevent hyperextension. Pt does exhibit improved L DF with use of brace. Pt exhibits decreased hyperextension with smaller step with LLE. Ascend/descend 4 x 6" stairs with L handrail and min A, laterally. Ambulation with RW through obstacle course navigating over obstacles and up/down 4" curb step with min A and RW. Standing knee and hip ext on Kinetron 5 x 30 sec or to fatigue with min A for balance and hip positioning. Pt agreeable to stay seated in w/c at end of session for lunch. Pt left seated in w/c in room with needs in reach at end of session.   Therapy Documentation Precautions:  Precautions Precautions: Back, Fall Precaution Comments: reviewed spinal precautions Required Braces or Orthoses: Spinal Brace Spinal Brace: Thoracolumbosacral orthotic Restrictions Weight Bearing Restrictions: No    Therapy/Group: Individual Therapy  Excell Seltzer, PT, DPT  12/16/2018, 4:16 PM

## 2018-12-16 NOTE — Plan of Care (Signed)
  Problem: Consults Goal: RH GENERAL PATIENT EDUCATION Description: See Patient Education module for education specifics. Outcome: Progressing Goal: Skin Care Protocol Initiated - if Braden Score 18 or less Description: If consults are not indicated, leave blank or document N/A Outcome: Progressing   Problem: RH BOWEL ELIMINATION Goal: RH STG MANAGE BOWEL WITH ASSISTANCE Description: STG Manage Bowel with mod I Assistance. Outcome: Progressing   Problem: RH BLADDER ELIMINATION Goal: RH STG MANAGE BLADDER WITH ASSISTANCE Description: STG Manage Bladder With mod I Assistance Outcome: Progressing   Problem: RH SKIN INTEGRITY Goal: RH STG SKIN FREE OF INFECTION/BREAKDOWN Outcome: Progressing Goal: RH STG MAINTAIN SKIN INTEGRITY WITH ASSISTANCE Description: STG Maintain Skin Integrity With mod I Assistance. Outcome: Progressing   Problem: RH SAFETY Goal: RH STG ADHERE TO SAFETY PRECAUTIONS W/ASSISTANCE/DEVICE Description: STG Adhere to Safety Precautions With mod I Assistance/Device. Outcome: Progressing   Problem: RH PAIN MANAGEMENT Goal: RH STG PAIN MANAGED AT OR BELOW PT'S PAIN GOAL Description: Pain level less that 4 on scale of 0-10 Outcome: Progressing   Problem: RH KNOWLEDGE DEFICIT GENERAL Goal: RH STG INCREASE KNOWLEDGE OF SELF CARE AFTER HOSPITALIZATION Outcome: Progressing   Problem: Consults Goal: RH SPINAL CORD INJURY PATIENT EDUCATION Description:  See Patient Education module for education specifics.  Outcome: Progressing   Problem: SCI BOWEL ELIMINATION Goal: RH STG MANAGE BOWEL WITH ASSISTANCE Description: STG Manage Bowel with mod I Assistance. Outcome: Progressing Goal: RH STG SCI MANAGE BOWEL PROGRAM W/ASSIST OR AS APPROPRIATE Description: STG SCI Manage bowel program with mod I assist or as appropriate. Outcome: Progressing   Problem: SCI BLADDER ELIMINATION Goal: RH STG MANAGE BLADDER WITH ASSISTANCE Description: STG Manage Bladder With mod I  Assistance Outcome: Progressing   Problem: RH SKIN INTEGRITY Goal: RH STG MAINTAIN SKIN INTEGRITY WITH ASSISTANCE Description: STG Maintain Skin Integrity With mod I Assistance. Outcome: Progressing   Problem: RH SAFETY Goal: RH STG ADHERE TO SAFETY PRECAUTIONS W/ASSISTANCE/DEVICE Description: STG Adhere to Safety Precautions With cues and reminder Assistance/Device. Outcome: Progressing   Problem: RH PAIN MANAGEMENT Goal: RH STG PAIN MANAGED AT OR BELOW PT'S PAIN GOAL Description: Pain scale <4/10 Outcome: Progressing   Problem: RH KNOWLEDGE DEFICIT SCI Goal: RH STG INCREASE KNOWLEDGE OF SELF CARE AFTER SCI Outcome: Progressing

## 2018-12-16 NOTE — Plan of Care (Signed)
Goals upgraded due to pt progress. See POC for goal details. Kellin Fifer, OTR/L

## 2018-12-16 NOTE — Progress Notes (Signed)
[facility] PHYSICAL MEDICINE & REHABILITATION PROGRESS NOTE  Subjective/Complaints: Doing well. Watched her up with PT today. Brace is tight in shoe  ROS: Patient denies fever, rash, sore throat, blurred vision, nausea, vomiting, diarrhea, cough, shortness of breath or chest pain,  headache, or mood change.    Objective: Vital Signs: Blood pressure (!) 153/52, pulse (!) 58, temperature 97.8 F (36.6 C), resp. rate 18, height 4\' 7"  (1.397 m), weight 56.3 kg, SpO2 99 %. No results found. Recent Labs    12/14/18 0725  WBC 6.3  HGB 10.6*  HCT 34.4*  PLT 478*   Recent Labs    12/14/18 0725  NA 138  K 3.9  CL 109  CO2 18*  GLUCOSE 149*  BUN 37*  CREATININE 1.36*  CALCIUM 9.5    Physical Exam: BP (!) 153/52 (BP Location: Left Arm)   Pulse (!) 58   Temp 97.8 F (36.6 C)   Resp 18   Ht 4\' 7"  (1.397 m)   Wt 56.3 kg   SpO2 99%   BMI 28.86 kg/m  Constitutional: No distress . Vital signs reviewed. HEENT: EOMI, oral membranes moist Neck: supple Cardiovascular: RRR without murmur. No JVD    Respiratory: CTA Bilaterally without wheezes or rales. Normal effort    GI: BS +, non-tender, non-distended  Musc: No edema or tenderness in extremities. Neurological: Alert and oriented Motor: B/l UE 5/5 proximal to distal, stable RLE: Hip flexion, knee extension 4-/5, ankle dorsiflexion 3+/5. LLE: HF, KE 3+/5, APF / ADF 2 to 2+--hyperextends left knee still in stance even with brace, overall fairly well balanced with walker though Sensation diminished to light touch bilateral LE--stable Skin: Back incision CDI, glued--dry  Psychiatric: pleasant.   Assessment/Plan: 1. Functional deficits secondary to cauda equina syndrome which require 3+ hours per day of interdisciplinary therapy in a comprehensive inpatient rehab setting.  Physiatrist is providing close team supervision and 24 hour management of active medical problems listed below.  Physiatrist and rehab team continue to  assess barriers to discharge/monitor patient progress toward functional and medical goals  Care Tool:  Bathing  Bathing activity did not occur: Refused Body parts bathed by patient: Right arm, Left arm, Front perineal area, Chest, Abdomen, Face, Left upper leg, Right lower leg, Left lower leg, Buttocks, Right upper leg   Body parts bathed by helper: Buttocks, Right upper leg, Left lower leg, Right lower leg, Left upper leg     Bathing assist Assist Level: Supervision/Verbal cueing     Upper Body Dressing/Undressing Upper body dressing   What is the patient wearing?: Pull over shirt, Orthosis Orthosis activity level: Performed by helper  Upper body assist Assist Level: Supervision/Verbal cueing    Lower Body Dressing/Undressing Lower body dressing      What is the patient wearing?: Underwear/pull up, Pants     Lower body assist Assist for lower body dressing: Minimal Assistance - Patient > 75%     Toileting Toileting Toileting Activity did not occur (Clothing management and hygiene only): N/A (no void or bm)(i&o cath)  Toileting assist Assist for toileting: Supervision/Verbal cueing     Transfers Chair/bed transfer  Transfers assist     Chair/bed transfer assist level: Contact Guard/Touching assist     Locomotion Ambulation   Ambulation assist      Assist level: Moderate Assistance - Patient 50 - 74% Assistive device: Walker-rolling Max distance: 60'   Walk 10 feet activity   Assist  Walk 10 feet activity did not occur: Safety/medical  concerns  Assist level: Moderate Assistance - Patient - 50 - 74% Assistive device: Walker-rolling   Walk 50 feet activity   Assist Walk 50 feet with 2 turns activity did not occur: Safety/medical concerns  Assist level: Moderate Assistance - Patient - 50 - 74% Assistive device: Walker-rolling    Walk 150 feet activity   Assist Walk 150 feet activity did not occur: Safety/medical concerns         Walk 10  feet on uneven surface  activity   Assist Walk 10 feet on uneven surfaces activity did not occur: Safety/medical concerns         Wheelchair     Assist Will patient use wheelchair at discharge?: Yes Type of Wheelchair: Manual    Wheelchair assist level: Supervision/Verbal cueing Max wheelchair distance: 150'    Wheelchair 50 feet with 2 turns activity    Assist        Assist Level: Supervision/Verbal cueing   Wheelchair 150 feet activity     Assist     Assist Level: Supervision/Verbal cueing      Medical Problem List and Plan: 1.  Gait abnormality, weakness, numbness secondary to cauda equina syndrome.  Continue CIR PT, OT   -doing well with left AFO. Continue to address left knee recurvatum. Goals advanced, really making nice progress 2.  Antithrombotics: -DVT/anticoagulation:  Mechanical: Sequential compression devices, below knee Bilateral lower extremities             -antiplatelet therapy: N/A 3. Pain Management: Oxycodone prn.    .   -cont lyrica 25mg  qhs ---seems to have helped  - prn tylenol, heat    4. Mood: LCSW to follow for evaluation and support.              -antipsychotic agents: N/A 5. Neuropsych: This patient is capable of making decisions on her  own behalf. 6. Skin/Wound Care: Monitor wound daily for healing.  7. Fluids/Electrolytes/Nutrition: Monitor I/O.   8. HTN: Monitor BP bid--continue Nifedipine daily with lopressor bid.     -reasonable control . Tachycardia:  resolved . 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 Urinary retention:    UA equivocal, urine culture negative   -  emptying bladder more completely 11.  Bowel incontinence:    -observe for patterns  Continue bowel meds --moving bowels 12.  AKI  Creatinine 1.36 7/20  Continue to encourage  PO fluids   13.  Acute blood loss anemia  Hemoglobin 10.6 7/20  -recent transfusion 1u PRBC  LOS: 12 days A FACE TO FACE EVALUATION WAS PERFORMED  Meredith Staggers 12/16/2018, 9:10 AM

## 2018-12-17 ENCOUNTER — Inpatient Hospital Stay (HOSPITAL_COMMUNITY): Payer: Medicare Other | Admitting: Physical Therapy

## 2018-12-17 ENCOUNTER — Inpatient Hospital Stay (HOSPITAL_COMMUNITY): Payer: Medicare Other

## 2018-12-17 ENCOUNTER — Inpatient Hospital Stay (HOSPITAL_COMMUNITY): Payer: Medicare Other | Admitting: Occupational Therapy

## 2018-12-17 ENCOUNTER — Ambulatory Visit (HOSPITAL_COMMUNITY): Payer: Medicare Other | Admitting: *Deleted

## 2018-12-17 MED ORDER — BISACODYL 10 MG RE SUPP: 10.0000 mg | Freq: Every day | RECTAL | 0 refills | Status: DC | PRN

## 2018-12-17 MED ORDER — ACETAMINOPHEN 325 MG PO TABS: 650.0000 mg | ORAL_TABLET | Freq: Three times a day (TID) | ORAL | Status: AC

## 2018-12-17 NOTE — Progress Notes (Addendum)
Physical Therapy Session Note  Patient Details  Name: Paula Hartman MRN: [id number] Date of Birth: 09/01/1940  Today's Date: 12/17/2018 PT Individual Time: 2355-7322 AND 1245-1258 PT Individual Time Calculation (min): 58 min AND 13 min  Short Term Goals: Week 2:  PT Short Term Goal 1 (Week 2): =LTG due to ELOS  Skilled Therapeutic Interventions/Progress Updates:   Session 1:  Pt in w/c and agreeable to therapy, no c/o pain. Min assist to don TLSO, total assist to don shoes and LAFO. Pt w/ wider shoes this AM, allow AFO to fit better in shoe. Pt self-propelled w/c to/from therapy gym w/ supervision using BUEs. Worked on gait in household/community environment. Ambulated over uneven surfaces including mat and mulch, up/down 4" curb, over ramp, and stepping over obstacles on ground. Primarily CGA w/ verbal and visual cues for technique w/ RW management, and needed mod assist to correct 1 LOB w/ placing RW over object. Additionally practiced car transfer at pt's car height ([place]) w/ CGA and visual cues for technique. Worked on standing balance w/ tossing horseshoes, emphasis on maintaining balance w/ unilateral UE support on RW and keeping L knee out of hyperextension, CGA for safety w/ no LOB. Bimanual tasks w/ pipe tree, min assist for balance w/o any UE support on RW. Returned to room and ended session in supine, all needs in reach.   Session 2:  Pt in w/c and agreeable to session, no c/o pain. Seated LE strengthening exercises including L LAQs, L heel/toe raises, L heel slides w/ orange theraband resistance, B abduction w/ orange theraband resistance, and adduction squeezes. All exercises 3x10. Over-pressure to L ankle DF with LAQs to stretch gastroc musculature. Ended session in w/c, all needs in reach.   Therapy Documentation Precautions:  Precautions Precautions: Back, Fall Precaution Comments: reviewed spinal precautions Required Braces or Orthoses: Spinal Brace Spinal  Brace: Thoracolumbosacral orthotic Restrictions Weight Bearing Restrictions: No  Therapy/Group: Individual Therapy  Paula Hartman 12/17/2018, 9:44 AM

## 2018-12-17 NOTE — Progress Notes (Signed)
Recreational Therapy Session Note  Patient Details  Name: Paula Hartman MRN: [id number] Date of Birth: 08-18-1940 Today's Date: 12/17/2018 Time:  845-915  Pain: no c/o, did c/o of brace pinching her calf, readjusted Skilled Therapeutic Interventions/Progress Updates: Session focused on community mobility at ambulatory level using RW during co-treat with PT.  Pt ambulated over various surface types, including tile, simulated grass surface, mulched surface, up and down a simulated curb step and a ramp as well as practicing stepping over objects with RW.  Pt required min cues for safety and experienced 1 LOB stepping over an object on the floor requiring Mod assist for balance recovery.  Pt stated she did not like her brace and feels like she walks better without it, doesn't want to wear it.  Education provided on how the brace supports her and assists her in safe walking.    Therapy/Group:Cotreatment   Hayden Kihara 12/17/2018, 10:48 AM

## 2018-12-17 NOTE — Progress Notes (Signed)
Physical Therapy Session Note  Patient Details  Name: Paula Hartman MRN: [id number] Date of Birth: Jun 25, 1940  Today's Date: 12/17/2018 PT Individual Time: 1300-1415 PT Individual Time Calculation (min): 75 min   Short Term Goals: Week 2:  PT Short Term Goal 1 (Week 2): =LTG due to ELOS  Skilled Therapeutic Interventions/Progress Updates:    Pt received seated in w/c in room, agreeable to PT session. No complaints of pain. Manual w/c propulsion 2 x 150 ft with use of BUE mod I. Verbal cues for safe setup of w/c for squat pivot transfer to mat, close SBA for transfer. Sit to stand with CGA to RW. Session focus on LLE NMR in standing. Standing balance with one UE support on RW and min A while tossing horseshoes while standing on airex. Manual cueing through LLE for knee control. Standing alt 4" step-ups with BUE support and min A for BLE strengthening. Seated BLE strengthening therex with 3# weights and/or orange theraband: marches, LAQ, HS curls, hip abd x 15 reps each. Squat pivot transfer back to bed close SBA. Sit to supine SBA. Pt left semi-reclined in bed with needs in reach at end of session.  Therapy Documentation Precautions:  Precautions Precautions: Back, Fall Precaution Comments: reviewed spinal precautions Required Braces or Orthoses: Spinal Brace Spinal Brace: Thoracolumbosacral orthotic Restrictions Weight Bearing Restrictions: No   Therapy/Group: Individual Therapy   Excell Seltzer, PT, DPT  12/17/2018, 3:34 PM

## 2018-12-17 NOTE — Plan of Care (Signed)
Upgraded gait goal distance due to progress

## 2018-12-17 NOTE — Plan of Care (Signed)
  Problem: Consults Goal: RH GENERAL PATIENT EDUCATION Description: See Patient Education module for education specifics. Outcome: Progressing Goal: Skin Care Protocol Initiated - if Braden Score 18 or less Description: If consults are not indicated, leave blank or document N/A Outcome: Progressing   Problem: RH BOWEL ELIMINATION Goal: RH STG MANAGE BOWEL WITH ASSISTANCE Description: STG Manage Bowel with mod I Assistance. Outcome: Progressing   Problem: RH BLADDER ELIMINATION Goal: RH STG MANAGE BLADDER WITH ASSISTANCE Description: STG Manage Bladder With mod I Assistance Outcome: Progressing   Problem: RH SKIN INTEGRITY Goal: RH STG SKIN FREE OF INFECTION/BREAKDOWN Outcome: Progressing Goal: RH STG MAINTAIN SKIN INTEGRITY WITH ASSISTANCE Description: STG Maintain Skin Integrity With mod I Assistance. Outcome: Progressing   Problem: RH SAFETY Goal: RH STG ADHERE TO SAFETY PRECAUTIONS W/ASSISTANCE/DEVICE Description: STG Adhere to Safety Precautions With mod I Assistance/Device. Outcome: Progressing   Problem: RH PAIN MANAGEMENT Goal: RH STG PAIN MANAGED AT OR BELOW PT'S PAIN GOAL Description: Pain level less that 4 on scale of 0-10 Outcome: Progressing   Problem: RH KNOWLEDGE DEFICIT GENERAL Goal: RH STG INCREASE KNOWLEDGE OF SELF CARE AFTER HOSPITALIZATION Outcome: Progressing   Problem: Consults Goal: RH SPINAL CORD INJURY PATIENT EDUCATION Description:  See Patient Education module for education specifics.  Outcome: Progressing   Problem: SCI BOWEL ELIMINATION Goal: RH STG MANAGE BOWEL WITH ASSISTANCE Description: STG Manage Bowel with mod I Assistance. Outcome: Progressing Goal: RH STG SCI MANAGE BOWEL PROGRAM W/ASSIST OR AS APPROPRIATE Description: STG SCI Manage bowel program with mod I assist or as appropriate. Outcome: Progressing   Problem: SCI BLADDER ELIMINATION Goal: RH STG MANAGE BLADDER WITH ASSISTANCE Description: STG Manage Bladder With mod I  Assistance Outcome: Progressing   Problem: RH SKIN INTEGRITY Goal: RH STG MAINTAIN SKIN INTEGRITY WITH ASSISTANCE Description: STG Maintain Skin Integrity With mod I Assistance. Outcome: Progressing   Problem: RH SAFETY Goal: RH STG ADHERE TO SAFETY PRECAUTIONS W/ASSISTANCE/DEVICE Description: STG Adhere to Safety Precautions With cues and reminder Assistance/Device. Outcome: Progressing   Problem: RH PAIN MANAGEMENT Goal: RH STG PAIN MANAGED AT OR BELOW PT'S PAIN GOAL Description: Pain scale <4/10 Outcome: Progressing   Problem: RH KNOWLEDGE DEFICIT SCI Goal: RH STG INCREASE KNOWLEDGE OF SELF CARE AFTER SCI Outcome: Progressing

## 2018-12-17 NOTE — Progress Notes (Signed)
Occupational Therapy Session Note  Patient Details  Name: Paula Hartman MRN: [id number] Date of Birth: 16-Oct-1940  Today's Date: 12/17/2018 OT Individual Time: 1115-1155 OT Individual Time Calculation (min): 40 min    Short Term Goals: Week 1:  OT Short Term Goal 1 (Week 1): Pt will perform toilet transfer at mod A consistently. OT Short Term Goal 1 - Progress (Week 1): Met OT Short Term Goal 2 (Week 1): Pt will perform LB dressing at mod A overall. OT Short Term Goal 2 - Progress (Week 1): Met OT Short Term Goal 3 (Week 1): Pt will perform toileting at mod A overall. OT Short Term Goal 3 - Progress (Week 1): Met  Skilled Therapeutic Interventions/Progress Updates:    1:1. Pt initially declining tx d/t pain in shoulders however with encouragemetn pt agreeable to go to bathroom. Pt dons brace and OT dons shoes for time. Pt ambulates with RW and TLSO to bathroom with tactile cues to back L knee to prevent hyperextension. Pt completes toileting with S overall. Pt propels w/c to dayroom to play game of wii bowling with S for standing balance and min VC for remote use. Exited session with pt seated in w/c, exit alarm on, call light in reach and all need smet set up with lunch tray  Therapy Documentation Precautions:  Precautions Precautions: Back, Fall Precaution Comments: reviewed spinal precautions Required Braces or Orthoses: Spinal Brace Spinal Brace: Thoracolumbosacral orthotic Restrictions Weight Bearing Restrictions: No General:   Vital Signs: Therapy Vitals Pulse Rate: 61 BP: 133/65 Patient Position (if appropriate): Sitting Oxygen Therapy SpO2: 98 % O2 Device: Room Air Pain: Pain Assessment Pain Scale: 0-10 Pain Score: 0-No pain ADL:   Vision   Perception    Praxis   Exercises:   Other Treatments:     Therapy/Group: Individual Therapy  Tonny Branch 12/17/2018, 11:26 AM

## 2018-12-17 NOTE — Progress Notes (Signed)
[place] PHYSICAL MEDICINE & REHABILITATION PROGRESS NOTE  Subjective/Complaints: No new issues. Pleased with progress. Legs still tingle but very tolerable. Sleeping better  ROS: Patient denies fever, rash, sore throat, blurred vision, nausea, vomiting, diarrhea, cough, shortness of breath or chest pain, joint or back pain, headache, or mood change.   Objective: Vital Signs: Blood pressure 133/65, pulse 61, temperature 98.3 F (36.8 C), temperature source Oral, resp. rate 16, height 4\' 7"  (1.397 m), weight 52.9 kg, SpO2 98 %. No results found. No results for input(s): WBC, HGB, HCT, PLT in the last 72 hours. No results for input(s): NA, K, CL, CO2, GLUCOSE, BUN, CREATININE, CALCIUM in the last 72 hours.  Physical Exam: BP 133/65 (BP Location: Left Arm)   Pulse 61   Temp 98.3 F (36.8 C) (Oral)   Resp 16   Ht 4\' 7"  (1.397 m)   Wt 52.9 kg   SpO2 98%   BMI 27.11 kg/m  Constitutional: No distress . Vital signs reviewed. HEENT: EOMI, oral membranes moist Neck: supple Cardiovascular: RRR without murmur. No JVD    Respiratory: CTA Bilaterally without wheezes or rales. Normal effort    GI: BS +, non-tender, non-distended  Musc: No edema or tenderness in extremities. Neurological: Alert and oriented Motor: B/l UE 5/5 proximal to distal, stable RLE: Hip flexion, knee extension 4-/5, ankle dorsiflexion 3+/5. LLE: HF, KE 3+/5, APF / ADF 2 to 2+---essentially stable Sensation diminished to light touch bilateral LE--stable Skin: Back incision CDI, glued--dry  Psychiatric: very pleasant  Assessment/Plan: 1. Functional deficits secondary to cauda equina syndrome which require 3+ hours per day of interdisciplinary therapy in a comprehensive inpatient rehab setting.  Physiatrist is providing close team supervision and 24 hour management of active medical problems listed below.  Physiatrist and rehab team continue to assess barriers to discharge/monitor patient progress toward  functional and medical goals  Care Tool:  Bathing  Bathing activity did not occur: Refused Body parts bathed by patient: Right arm, Left arm, Front perineal area, Chest, Abdomen, Face, Left upper leg, Right lower leg, Left lower leg, Buttocks, Right upper leg   Body parts bathed by helper: Buttocks, Right upper leg, Left lower leg, Right lower leg, Left upper leg     Bathing assist Assist Level: Set up assist     Upper Body Dressing/Undressing Upper body dressing   What is the patient wearing?: Pull over shirt, Orthosis Orthosis activity level: Performed by helper  Upper body assist Assist Level: Independent    Lower Body Dressing/Undressing Lower body dressing      What is the patient wearing?: Underwear/pull up, Pants     Lower body assist Assist for lower body dressing: Contact Guard/Touching assist     Toileting Toileting Toileting Activity did not occur (Clothing management and hygiene only): N/A (no void or bm)(i&o cath)  Toileting assist Assist for toileting: Supervision/Verbal cueing     Transfers Chair/bed transfer  Transfers assist     Chair/bed transfer assist level: Contact Guard/Touching assist     Locomotion Ambulation   Ambulation assist      Assist level: Minimal Assistance - Patient > 75% Assistive device: Walker-rolling Max distance: 75'   Walk 10 feet activity   Assist  Walk 10 feet activity did not occur: Safety/medical concerns  Assist level: Minimal Assistance - Patient > 75% Assistive device: Walker-rolling   Walk 50 feet activity   Assist Walk 50 feet with 2 turns activity did not occur: Safety/medical concerns  Assist level: Minimal Assistance -  Patient > 75% Assistive device: Walker-rolling    Walk 150 feet activity   Assist Walk 150 feet activity did not occur: Safety/medical concerns         Walk 10 feet on uneven surface  activity   Assist Walk 10 feet on uneven surfaces activity did not occur:  Safety/medical concerns   Assist level: Minimal Assistance - Patient > 75% Assistive device: Aeronautical engineer Will patient use wheelchair at discharge?: Yes Type of Wheelchair: Manual    Wheelchair assist level: Supervision/Verbal cueing Max wheelchair distance: 150'    Wheelchair 50 feet with 2 turns activity    Assist        Assist Level: Supervision/Verbal cueing   Wheelchair 150 feet activity     Assist     Assist Level: Supervision/Verbal cueing      Medical Problem List and Plan: 1.  Gait abnormality, weakness, numbness secondary to cauda equina syndrome.  Continue CIR PT, OT   -doing well with left AFO. Continue to address left knee recurvatum. Goals advanced, really making nice progress 2.  Antithrombotics: -DVT/anticoagulation:  Mechanical: Sequential compression devices, below knee Bilateral lower extremities             -antiplatelet therapy: N/A 3. Pain Management: Oxycodone prn.    .   -cont lyrica 25mg  qhs ---improved   - prn tylenol, heat    4. Mood: LCSW to follow for evaluation and support.              -antipsychotic agents: N/A 5. Neuropsych: This patient is capable of making decisions on her  own behalf. 6. Skin/Wound Care: Monitor wound daily for healing.  7. Fluids/Electrolytes/Nutrition: Monitor I/O.   8. HTN: Monitor BP bid--continue Nifedipine daily with lopressor bid.     -reasonable control 7/23 . Tachycardia:  resolved . 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 Urinary retention:    UA equivocal, urine culture negative   -  emptying bladder more completely 11.  Bowel incontinence:    -observe for patterns  Continue bowel meds --moving bowels 12.  AKI  Creatinine 1.36 7/20  Continue to encourage PO fluids   13.  Acute blood loss anemia  Hemoglobin  10.6 7/20  -recent transfusion 1u PRBC  LOS: 13 days A FACE TO FACE EVALUATION WAS PERFORMED  Meredith Staggers 12/17/2018, 9:52 AM

## 2018-12-18 ENCOUNTER — Inpatient Hospital Stay (HOSPITAL_COMMUNITY): Payer: Medicare Other | Admitting: Physical Therapy

## 2018-12-18 ENCOUNTER — Inpatient Hospital Stay (HOSPITAL_COMMUNITY): Payer: Medicare Other | Admitting: Occupational Therapy

## 2018-12-18 ENCOUNTER — Inpatient Hospital Stay (HOSPITAL_COMMUNITY): Payer: Medicare Other

## 2018-12-18 NOTE — Progress Notes (Signed)
[place] PHYSICAL MEDICINE & REHABILITATION PROGRESS NOTE  Subjective/Complaints: No new issues. Pleased with progress. Legs still tingle but very tolerable. Sleeping better  ROS: Patient denies fever, rash, sore throat, blurred vision, nausea, vomiting, diarrhea, cough, shortness of breath or chest pain, joint or back pain, headache, or mood change.   Objective: Vital Signs: Blood pressure (!) 137/50, pulse (!) 52, temperature 98 F (36.7 C), temperature source Oral, resp. rate 14, height 4\' 7"  (1.397 m), weight 52.9 kg, SpO2 99 %. No results found. No results for input(s): WBC, HGB, HCT, PLT in the last 72 hours. No results for input(s): NA, K, CL, CO2, GLUCOSE, BUN, CREATININE, CALCIUM in the last 72 hours.  Physical Exam: BP (!) 137/50 (BP Location: Left Arm)   Pulse (!) 52   Temp 98 F (36.7 C) (Oral)   Resp 14   Ht 4\' 7"  (1.397 m)   Wt 52.9 kg   SpO2 99%   BMI 27.11 kg/m  Constitutional: No distress . Vital signs reviewed. HEENT: EOMI, oral membranes moist Neck: supple Cardiovascular: RRR without murmur. No JVD    Respiratory: CTA Bilaterally without wheezes or rales. Normal effort    GI: BS +, non-tender, non-distended  Musc: No edema or tenderness in extremities. Neurological: Alert and oriented Motor: B/l UE 5/5 proximal to distal, stable RLE: Hip flexion, knee extension 4-/5, ankle dorsiflexion 3+/5. LLE: HF, KE 3+/5, APF / ADF 2 to 2+---essentially stable Sensation diminished to light touch bilateral LE--stable Skin: Back incision CDI, glued--dry  Psychiatric: very pleasant  Assessment/Plan: 1. Functional deficits secondary to cauda equina syndrome which require 3+ hours per day of interdisciplinary therapy in a comprehensive inpatient rehab setting.  Physiatrist is providing close team supervision and 24 hour management of active medical problems listed below.  Physiatrist and rehab team continue to assess barriers to discharge/monitor patient progress  toward functional and medical goals  Care Tool:  Bathing  Bathing activity did not occur: Refused Body parts bathed by patient: Right arm, Left arm, Front perineal area, Chest, Abdomen, Face, Left upper leg, Right lower leg, Left lower leg, Buttocks, Right upper leg   Body parts bathed by helper: Buttocks, Right upper leg, Left lower leg, Right lower leg, Left upper leg     Bathing assist Assist Level: Set up assist     Upper Body Dressing/Undressing Upper body dressing   What is the patient wearing?: Pull over shirt, Orthosis Orthosis activity level: Performed by helper  Upper body assist Assist Level: Independent    Lower Body Dressing/Undressing Lower body dressing      What is the patient wearing?: Underwear/pull up, Pants     Lower body assist Assist for lower body dressing: Contact Guard/Touching assist     Toileting Toileting Toileting Activity did not occur (Clothing management and hygiene only): N/A (no void or bm)(i&o cath)  Toileting assist Assist for toileting: Supervision/Verbal cueing     Transfers Chair/bed transfer  Transfers assist     Chair/bed transfer assist level: Contact Guard/Touching assist     Locomotion Ambulation   Ambulation assist      Assist level: Minimal Assistance - Patient > 75% Assistive device: Walker-rolling Max distance: 75'   Walk 10 feet activity   Assist  Walk 10 feet activity did not occur: Safety/medical concerns  Assist level: Minimal Assistance - Patient > 75% Assistive device: Walker-rolling   Walk 50 feet activity   Assist Walk 50 feet with 2 turns activity did not occur: Safety/medical concerns  Assist level: Minimal Assistance - Patient > 75% Assistive device: Walker-rolling    Walk 150 feet activity   Assist Walk 150 feet activity did not occur: Safety/medical concerns         Walk 10 feet on uneven surface  activity   Assist Walk 10 feet on uneven surfaces activity did not occur:  Safety/medical concerns   Assist level: Minimal Assistance - Patient > 75% Assistive device: Aeronautical engineer Will patient use wheelchair at discharge?: Yes Type of Wheelchair: Manual    Wheelchair assist level: Independent Max wheelchair distance: 150'    Wheelchair 50 feet with 2 turns activity    Assist        Assist Level: Independent   Wheelchair 150 feet activity     Assist     Assist Level: Independent      Medical Problem List and Plan: 1.  Gait abnormality, weakness, numbness secondary to cauda equina syndrome.  Continue CIR PT, OT   Upgraded goals d/t nice progress with gait/AFO 2.  Antithrombotics: -DVT/anticoagulation:  Mechanical: Sequential compression devices, below knee Bilateral lower extremities             -antiplatelet therapy: N/A 3. Pain Management: Oxycodone prn.    .   -cont lyrica 25mg  qhs ---improved   - prn tylenol, heat    4. Mood: LCSW to follow for evaluation and support.              -antipsychotic agents: N/A 5. Neuropsych: This patient is capable of making decisions on her  own behalf. 6. Skin/Wound Care: Monitor wound daily for healing.  7. Fluids/Electrolytes/Nutrition: having been pushing fluids  -she's eating and drinking well  -recheck BUN monday 8. HTN: Monitor BP bid--continue Nifedipine daily with lopressor bid.     -reasonable control 7/24 . Tachycardia:  resolved . occ brady Vitals:   12/18/18 0505 12/18/18 0913  BP: (!) 131/49 (!) 137/50  Pulse: (!) 57 (!) 52  Resp: 14   Temp: 98 F (36.7 C)   SpO2: 99% 99%       10. Urinary retention:    UA equivocal, urine culture negative   -  emptying bladder more completely 11.  Bowel incontinence:    -observe for patterns  Continue bowel meds --moving bowels regularly 12.  AKI  Creatinine 1.36 7/20  Continue to encourage PO fluids   13.  Acute blood loss anemia  Hemoglobin 10.6 7/20  -recent transfusion 1u PRBC  -recheck  Monday  LOS: 14 days A FACE TO [place] T  12/18/2018, 10:44 AM

## 2018-12-18 NOTE — Progress Notes (Signed)
Occupational Therapy Session Note  Patient Details  Name: Paula Hartman MRN: [id number] Date of Birth: 10/18/1940  Today's Date: 12/18/2018 OT Individual Time: 6431-4276 OT Individual Time Calculation (min): 72 min    Short Term Goals: Week 1:  OT Short Term Goal 1 (Week 1): Pt will perform toilet transfer at mod A consistently. OT Short Term Goal 1 - Progress (Week 1): Met OT Short Term Goal 2 (Week 1): Pt will perform LB dressing at mod A overall. OT Short Term Goal 2 - Progress (Week 1): Met OT Short Term Goal 3 (Week 1): Pt will perform toileting at mod A overall. OT Short Term Goal 3 - Progress (Week 1): Met  Skilled Therapeutic Interventions/Progress Updates:    1;1. Pt received in bed agreeable to shower. Pt completes ambulation with RW ad TLSO donned EOB to TTB with supervision. Pt bathes seated using lateral leans to wash buttocks with distant supervision. Pt transfers into w/c with grab bar use with supervision and VC for hand placement. Pt dresses with set up for UB and A for AFO/L shoe only for LB dressing. Pt stands at sink for oral care with set up. Pt dislikes AFO and reports will likely not wear at home. Pt completes w/c propulsion to tx gym with supervision. Pt transfers onto mat with RW and supervision. Pt completes sit<>stand with no AD and CGA  With foot externally rotating d/t decreased WB through forefoot. Pt completes 2x5 mini squats for quad control with CGA and hands on standard chair. Pt completes 2x10 3# dowel rod HEP for global strengthening required for BADLs/transfers with instrucitonal cues. Exited session with pt seated inw/c, call light inreach and all need smet  Therapy Documentation Precautions:  Precautions Precautions: Back, Fall Precaution Comments: reviewed spinal precautions Required Braces or Orthoses: Spinal Brace Spinal Brace: Thoracolumbosacral orthotic Restrictions Weight Bearing Restrictions: No General:   Vital Signs: Therapy  Vitals Pulse Rate: (!) 52 BP: (!) 137/50 Patient Position (if appropriate): Lying Oxygen Therapy SpO2: 99 % O2 Device: Room Air Pain:   ADL:   Vision   Perception    Praxis   Exercises:   Other Treatments:     Therapy/Group: Individual Therapy  Tonny Branch 12/18/2018, 9:46 AM

## 2018-12-18 NOTE — Progress Notes (Signed)
  Patient ID: Paula Hartman, female   DOB: July 08, 1940, 78 y.o.   MRN: [id number]    Diagnosis code:  G83.4; M43.16; M51.16; M48.061; Z47.89  Height:  4'7"  Weight:   119   Patient has cauda equina syndrome post decompression which requires her head, neck and back to be positioned in ways not feasible with a normal bed.  Head must be elevated at least 30 degress or she has back pain.  Pt with back precautions and she requires frequent and immediate changes in body position which cannot be achieved with a normal bed.

## 2018-12-18 NOTE — Progress Notes (Signed)
Physical Therapy Session Note  Patient Details  Name: Paula Hartman MRN: [id number] Date of Birth: 1941/04/06  Today's Date: 12/18/2018 PT Individual Time: 1305-1400 and 5366-4403 PT Individual Time Calculation (min): 55 min and 24 min  Short Term Goals: Week 2:  PT Short Term Goal 1 (Week 2): =LTG due to ELOS   Skilled Therapeutic Interventions/Progress Updates:  Treatment 1: Pt received in w/c & agreeable to tx. Pt reports unrated back pain following standing attempts without BUE support during last PT session but pt reports being premedicated. Assisted pt with tightening TLSO & adjusting it, and donning B shoes & L AFO total assist. Pt reports need to use restroom and ambulates in room/bathroom with RW & CGA, completing clothing management with min assist for balance, toilet transfer to elevated seat over toilet with CGA, and pt with continent void. Pt performs hand hygiene standing at sink with CGA for balance. Pt propels w/c room<>dayroom with BUE & independence. Pt completes sit<>stand with CGA and ambulates 70 ft + 70 ft + 20 ft with RW & CGA with genu valgum R knee, RLE external rotation, impaired coordination and placement LLE with decreased gait speed overall. Pt performed standing marches & mini squats, 2 sets x 12 reps each, with BUE support on RW and instructional cuing for technique with task focusing on BLE strengthening. Pt performs taps to colored targets on floor and then to targets on 3" step with BUE support on RW, CGA and task focusing on BLE coordination. Attempted to have pt perform task with only 1 UE support but pt significantly impaired balance/strength & unable to complete in this manner. Pt utilized nu-step on level 3 x 10 minutes with all four extremities (attempted to have pt use only BLE on level 1 but pt reports this causes LBP so all extremities utilized) for global strengthening & endurance training with multiple rest breaks 2/2 fatigue. At end of session pt returns  to supine in bed, using BUE to transfer BLE onto bed. Pt left in bed with alarm set & all needs at hand.  Treatment 2: Pt received in bed & agreeable to tx. Pt reports 7/10 back pain and pain meds requested & administered during session. Pt transfers to sitting EOB with bed features & supervision and therapist assists with donning TLSO, pt dons socks with set up assist, and therapist provides total assist for B shoes & L AFO for time management. Pt transfers sit<>stand with as little as supervision during session, with cuing 1x 2/2 poor eccentric control with stand>sit. Pt ambulates 230 ft + 230 ft with CGA first trial, close supervision 2nd trial with step through pattern and same gait pattern as noted above. At end of session pt returns to supine with supervision, hospital bed features, and pt able to transfer BLE onto bed without BUE assist on this date. Therapist provides min cuing at end of session to maintain back precautions. Pt left in bed with alarm set & all needs at hand.  Pt with no c/o L AFO during either session.  Therapy Documentation Precautions:  Precautions Precautions: Back, Fall Precaution Comments: reviewed spinal precautions Required Braces or Orthoses: Spinal Brace Spinal Brace: Thoracolumbosacral orthotic Restrictions Weight Bearing Restrictions: No     Therapy/Group: Individual Therapy  Paula Hartman 12/18/2018, 3:34 PM

## 2018-12-18 NOTE — Progress Notes (Signed)
Occupational Therapy Session Note  Patient Details  Name: Paula Hartman MRN: [id number] Date of Birth: [birth year]  Today's Date: 12/18/2018 OT Individual Time: 0800-0826 OT Individual Time Calculation (min): 26 min   Skilled Therapeutic Interventions/Progress Updates:     Pt greeted in bed, requesting to use the bathroom. She donned her TLSO EOB with min vcs for locating her drawstrings. OT taught her to keep the drawstrings in the same place directly on front of brace. Ambulatory transfer to elevated toilet completed with steady assist without her AFO. Pt at times shaking her Lt foot and marching it a little in place because it felt "asleep." No LOBs but she needed min cuing to turn whole body with device vs twisting to transfer onto surfaces. Provided pt with a footstool to increase ease of voiding (placed it under feet with knees slightly extended for precaution adherence). Pt with continent bladder void but unable to void bowels. Hygiene completed while seated. Steady assist for clothing mgt. She opted to complete handwashing and oral care w/c level at sink to conserve energy. Pt completed stated tasks unassisted. Discussed using therapeutic activities in room to help with pain mgt and increasing psychosocial health. She reports she listens to music and plays gambling games on her cell phone. Stand pivot<bed completed with steady assist with vcs for rotating trunk vs twisting. Supervision for sit<supine using logroll technique. Pt was left in bed with all needs within reach and bed alarm set. Tx focus placed on functional transfers, dynamic balance, and precaution adherence during functional tasks.   2/3 recall of back precautions during session  Therapy Documentation Precautions:  Precautions Precautions: Back, Fall Precaution Comments: reviewed spinal precautions Required Braces or Orthoses: Spinal Brace Spinal Brace: Thoracolumbosacral orthotic Restrictions Weight Bearing  Restrictions: No Vital Signs: Therapy Vitals Pulse Rate: (!) 52 BP: (!) 137/50 Patient Position (if appropriate): Lying Oxygen Therapy SpO2: 99 % O2 Device: Room Air Pain: L LE pain. Per RN, pt premedicated, however consented for OT to apply topical antalgesic for LEs and shoulders. Pt opted to wait until after bathing during 2nd OT session before having antalgesic cream applied   Pain Assessment Pain Scale: 0-10 Pain Score: 0-No pain ADL:       Therapy/Group: Individual Therapy  Ladaysha Soutar A Sitara Cashwell 12/18/2018, 12:27 PM

## 2018-12-18 NOTE — Progress Notes (Signed)
Physical Therapy Session Note  Patient Details  Name: Paula Hartman MRN: [id number] Date of Birth: [birth year]  Today's Date: 12/18/2018 PT Individual Time: 1134-1203 PT Individual Time Calculation (min): 29 min   Short Term Goals: Week 2:  PT Short Term Goal 1 (Week 2): =LTG due to ELOS  Skilled Therapeutic Interventions/Progress Updates:    Pt received sitting in w/c and agreeable to therapy session. Transported to/from gym in w/c. Pt reports that she does not like her L LE AFO stating that she feels it prevents her from being able to walk. Ambulated ~17ft using RW wearing L LE AFO with CGA for balance - pt reports that when the AFO prevents her from hyperextending her knee it causes increased LBP but when she is able to fully hyperextend it decreases her LBP  - also pt states that the AFO causes her to externally rotate in stance when her knee stays slightly flexed. Therapist doffed AFO and pt continues to demonstrate only trace ankle DF activation with compensation from ankle everters. Performed sit<>stand w/c<>L UE support on therapist's shoulder focusing on L LE knee control in stance with decreased UE support - pt having increased fear of falling that improved with mirror feedback for improved upright trunk posture and increased hip/knee extension. Ambulated ~56ft using RW without AFO and pt demonstrates L toe drag during swing phase and continued hyperextension - pt educated on need for AFO to improve foot clearance due to increased fall risk. Transported back to room in w/c and left sitting in w/c with needs in reach and chair alarm on.  Therapy Documentation Precautions:  Precautions Precautions: Back, Fall Precaution Comments: reviewed spinal precautions Required Braces or Orthoses: Spinal Brace Spinal Brace: Thoracolumbosacral orthotic Restrictions Weight Bearing Restrictions: No  Pain: Reports 0/10 LBP at beginning of the session that increases to 7/10 at end of session  reporting activity and walking increases her pain - provided seated rest breaks as needed throughout session for pain management and RN notified at end of session.   Therapy/Group: Individual Therapy  Tawana Scale, PT, DPT 12/18/2018, 8:02 AM

## 2018-12-18 NOTE — Progress Notes (Signed)
Social Work Patient ID: Paula Hartman, female   DOB: 05/25/1941, 78 y.o.   MRN: [id number]   CSW met with pt and talked with her dtr via telephone to update them on team conference discussion, targeted d/c date, and dtr coming in for family education on Monday, from 1-3pm.  CSW ordered DME and will arrange HH.  No other concerns/questions/needs.  CSW will remain available to assist as needed.  

## 2018-12-18 NOTE — Plan of Care (Signed)
  Problem: Consults Goal: RH GENERAL PATIENT EDUCATION Description: See Patient Education module for education specifics. Outcome: Progressing Goal: Skin Care Protocol Initiated - if Braden Score 18 or less Description: If consults are not indicated, leave blank or document N/A Outcome: Progressing   Problem: RH BOWEL ELIMINATION Goal: RH STG MANAGE BOWEL WITH ASSISTANCE Description: STG Manage Bowel with mod I Assistance. Outcome: Progressing   Problem: RH BLADDER ELIMINATION Goal: RH STG MANAGE BLADDER WITH ASSISTANCE Description: STG Manage Bladder With mod I Assistance Outcome: Progressing   Problem: RH SKIN INTEGRITY Goal: RH STG SKIN FREE OF INFECTION/BREAKDOWN Outcome: Progressing Goal: RH STG MAINTAIN SKIN INTEGRITY WITH ASSISTANCE Description: STG Maintain Skin Integrity With mod I Assistance. Outcome: Progressing   Problem: RH SAFETY Goal: RH STG ADHERE TO SAFETY PRECAUTIONS W/ASSISTANCE/DEVICE Description: STG Adhere to Safety Precautions With mod I Assistance/Device. Outcome: Progressing   Problem: RH PAIN MANAGEMENT Goal: RH STG PAIN MANAGED AT OR BELOW PT'S PAIN GOAL Description: Pain level less that 4 on scale of 0-10 Outcome: Progressing   Problem: RH KNOWLEDGE DEFICIT GENERAL Goal: RH STG INCREASE KNOWLEDGE OF SELF CARE AFTER HOSPITALIZATION Outcome: Progressing   Problem: Consults Goal: RH SPINAL CORD INJURY PATIENT EDUCATION Description:  See Patient Education module for education specifics.  Outcome: Progressing   Problem: SCI BOWEL ELIMINATION Goal: RH STG MANAGE BOWEL WITH ASSISTANCE Description: STG Manage Bowel with mod I Assistance. Outcome: Progressing Goal: RH STG SCI MANAGE BOWEL PROGRAM W/ASSIST OR AS APPROPRIATE Description: STG SCI Manage bowel program with mod I assist or as appropriate. Outcome: Progressing   Problem: SCI BLADDER ELIMINATION Goal: RH STG MANAGE BLADDER WITH ASSISTANCE Description: STG Manage Bladder With mod I  Assistance Outcome: Progressing   Problem: RH SKIN INTEGRITY Goal: RH STG MAINTAIN SKIN INTEGRITY WITH ASSISTANCE Description: STG Maintain Skin Integrity With mod I Assistance. Outcome: Progressing   Problem: RH SAFETY Goal: RH STG ADHERE TO SAFETY PRECAUTIONS W/ASSISTANCE/DEVICE Description: STG Adhere to Safety Precautions With cues and reminder Assistance/Device. Outcome: Progressing   Problem: RH PAIN MANAGEMENT Goal: RH STG PAIN MANAGED AT OR BELOW PT'S PAIN GOAL Description: Pain scale <4/10 Outcome: Progressing   Problem: RH KNOWLEDGE DEFICIT SCI Goal: RH STG INCREASE KNOWLEDGE OF SELF CARE AFTER SCI Outcome: Progressing

## 2018-12-19 ENCOUNTER — Inpatient Hospital Stay (HOSPITAL_COMMUNITY): Payer: Medicare Other | Admitting: Physical Therapy

## 2018-12-19 NOTE — Progress Notes (Signed)
Physical Therapy Session Note  Patient Details  Name: Paula Hartman MRN: [id number] Date of Birth: 1941/01/23  Today's Date: 12/19/2018 PT Individual Time: 8832-5498 PT Individual Time Calculation (min): 58 min   Short Term Goals: Week 2:  PT Short Term Goal 1 (Week 2): =LTG due to ELOS  Skilled Therapeutic Interventions/Progress Updates:    Pt received sitting in w/c wearing TLSO and agreeable to therapy session. Donned B socks and R shoe with set-up assist and L shoe with AFO max assist. Pt reports discomfort in toe region of L shoe/AFO with therapist attempting to readjust but continues to be uncomfortable. B UE w/c propulsion ~170ft to therapy gym mod-I. Pt able to set-up w/c for sit<>stand transfer managing brakes and leg rests with supervision. Sit<>stands using RW with CGA for steadying throughout session. Ambulated 47ft using RW with CGA for steadying and pt demonstrating no L knee hyperextension while wearing AFO today. Ascended/descended 4 steps laterally using R HR x3 trials with min assist for balance and max progressed to min cuing for proper sequencing with pt attempting to cross legs initially - pt educated on family assisting with taking RW up/down the stairs for her. Ambulated 19ft using RW with CGA for steadying - no L knee hyperextension. Transported in w/c to ADL apartment. Pt educated on use of walker bag to carry items as she is to have B hand support on RW while ambulating. Ambulated in household kitchen environment using RW to locate and grasp fruit from various upper cabinets/drawers/refridgerator all with CGA/min assist and initially mod cuing progressed to min cuing for safety awareness and proper AD management. Ambulated ~255ft using RW back to room with CGA for steadying - pt education regarding fall risk safety in the home including need to wear L LE AFO, proper footwear, use of RW for all ambulation, and ambulating on level surfaces. Pt requesting to return to bed.  Doffed TLSO sitting EOB with supervision. Sit>supine with supervision and reinforced education of reverse logroll technique with pt demonstrating understanding. Pt left supine in bed with needs in reach and bed alarm on.  Therapy Documentation Precautions:  Precautions Precautions: Back, Fall Precaution Comments: reviewed spinal precautions Required Braces or Orthoses: Spinal Brace Spinal Brace: Thoracolumbosacral orthotic Restrictions Weight Bearing Restrictions: No  Pain: Reports 3/10 left low back pain that increased to 7/10 by end of therapy session - seated rest breaks provided throughout for pain management and pt reporting she will wait and call RN for medication later so she can rest better tonight.   Therapy/Group: Individual Therapy  Tawana Scale, PT, DPT 12/19/2018, 3:34 PM

## 2018-12-19 NOTE — Plan of Care (Signed)
  Problem: Consults Goal: RH GENERAL PATIENT EDUCATION Description: See Patient Education module for education specifics. Outcome: Progressing Goal: Skin Care Protocol Initiated - if Braden Score 18 or less Description: If consults are not indicated, leave blank or document N/A Outcome: Progressing   Problem: RH BOWEL ELIMINATION Goal: RH STG MANAGE BOWEL WITH ASSISTANCE Description: STG Manage Bowel with mod I Assistance. Outcome: Progressing   Problem: RH BLADDER ELIMINATION Goal: RH STG MANAGE BLADDER WITH ASSISTANCE Description: STG Manage Bladder With mod I Assistance Outcome: Progressing   Problem: RH SKIN INTEGRITY Goal: RH STG SKIN FREE OF INFECTION/BREAKDOWN Outcome: Progressing Goal: RH STG MAINTAIN SKIN INTEGRITY WITH ASSISTANCE Description: STG Maintain Skin Integrity With mod I Assistance. Outcome: Progressing   Problem: RH SAFETY Goal: RH STG ADHERE TO SAFETY PRECAUTIONS W/ASSISTANCE/DEVICE Description: STG Adhere to Safety Precautions With mod I Assistance/Device. Outcome: Progressing   Problem: RH PAIN MANAGEMENT Goal: RH STG PAIN MANAGED AT OR BELOW PT'S PAIN GOAL Description: Pain level less that 4 on scale of 0-10 Outcome: Progressing   Problem: RH KNOWLEDGE DEFICIT GENERAL Goal: RH STG INCREASE KNOWLEDGE OF SELF CARE AFTER HOSPITALIZATION Outcome: Progressing   Problem: Consults Goal: RH SPINAL CORD INJURY PATIENT EDUCATION Description:  See Patient Education module for education specifics.  Outcome: Progressing   Problem: SCI BOWEL ELIMINATION Goal: RH STG MANAGE BOWEL WITH ASSISTANCE Description: STG Manage Bowel with mod I Assistance. Outcome: Progressing Goal: RH STG SCI MANAGE BOWEL PROGRAM W/ASSIST OR AS APPROPRIATE Description: STG SCI Manage bowel program with mod I assist or as appropriate. Outcome: Progressing   Problem: SCI BLADDER ELIMINATION Goal: RH STG MANAGE BLADDER WITH ASSISTANCE Description: STG Manage Bladder With mod I  Assistance Outcome: Progressing   Problem: RH SKIN INTEGRITY Goal: RH STG MAINTAIN SKIN INTEGRITY WITH ASSISTANCE Description: STG Maintain Skin Integrity With mod I Assistance. Outcome: Progressing   Problem: RH SAFETY Goal: RH STG ADHERE TO SAFETY PRECAUTIONS W/ASSISTANCE/DEVICE Description: STG Adhere to Safety Precautions With cues and reminder Assistance/Device. Outcome: Progressing   Problem: RH PAIN MANAGEMENT Goal: RH STG PAIN MANAGED AT OR BELOW PT'S PAIN GOAL Description: Pain scale <4/10 Outcome: Progressing   Problem: RH KNOWLEDGE DEFICIT SCI Goal: RH STG INCREASE KNOWLEDGE OF SELF CARE AFTER SCI Outcome: Progressing

## 2018-12-19 NOTE — Progress Notes (Signed)
Paula Hartman is a 78 y.o. female who is admitted for CIR with weakness gait abnormality secondary to cauda equina syndrome  Past Medical History:  Diagnosis Date  . Allergic rhinitis   . Exposure to TB   . HTN (hypertension)   . Hyperlipidemia   . 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 No new problems.  Able to ambulate with walker  Objective: Vital signs in last 24 hours: Temp:  [98.3 F (36.8 C)-98.6 F (37 C)] 98.5 F (36.9 C) (07/25 0509) Pulse Rate:  [60-72] 60 (07/25 0509) Resp:  [16-18] 16 (07/25 0509) BP: (109-149)/(50-54) 149/54 (07/25 0509) SpO2:  [98 %] 98 % (07/25 0509) Weight change:  Last BM Date: 12/19/18   Patient Vitals for the past 24 hrs:  BP Temp Temp src Pulse Resp SpO2  12/19/18 0509 (!) 149/54 98.5 F (36.9 C) Oral 60 16 98 %  12/18/18 1933 (!) 134/54 98.6 F (37 C) Oral 72 16 98 %  12/18/18 1420 (!) 109/50 98.3 F (36.8 C) - 63 18 98 %     Physical Exam General: No apparent distress alert and in good spirits HEENT: not dry Lungs: Normal effort. Lungs clear to auscultation, no crackles or wheezes. Cardiovascular: Regular rate and rhythm, no edema Abdomen: S/NT/ND; BS(+) Musculoskeletal:  unchanged Neurological: No new neurological deficits with lower extremity weakness and diminished sensation Wounds: Back incision healing Skin: clear   Mental state: Alert, oriented, cooperative    Lab Results: BMET    Component Value Date/Time   NA 138 12/14/2018 0725   K 3.9 12/14/2018 0725   CL 109 12/14/2018 0725   CO2 18 (L) 12/14/2018 0725   GLUCOSE 149 (H) 12/14/2018 0725   BUN 37 (H) 12/14/2018 0725   CREATININE 1.36 (H) 12/14/2018 0725   CALCIUM 9.5 12/14/2018 0725   GFRNONAA 37 (L) 12/14/2018 0725   GFRAA 43 (L) 12/14/2018 0725   CBC    Component Value Date/Time   WBC 6.3 12/14/2018 0725   RBC 3.64 (L)  12/14/2018 0725   HGB 10.6 (L) 12/14/2018 0725   HCT 34.4 (L) 12/14/2018 0725   PLT 478 (H) 12/14/2018 0725   MCV 94.5 12/14/2018 0725   MCH 29.1 12/14/2018 0725   MCHC 30.8 12/14/2018 0725   RDW 13.5 12/14/2018 0725   LYMPHSABS 1.6 12/06/2018 0719   MONOABS 0.6 12/06/2018 0719   EOSABS 0.4 12/06/2018 0719   BASOSABS 0.0 12/06/2018 0719     Medications: I have reviewed the patient's current medications.  Assessment/Plan:  Functional deficits secondary to cauda equina syndrome.  Continue CIR DVT prophylaxis.  Continue SCDs Hypertension.  Remains well controlled on metoprolol and nifedipine Urinary retention.  Improving   Length of stay, days: 15  Marletta Lor , MD 12/19/2018, 10:37 AM

## 2018-12-20 MED ORDER — HYDROCORTISONE 1 % EX CREA
TOPICAL_CREAM | Freq: Three times a day (TID) | CUTANEOUS | Status: AC
  Administered 2018-12-20: 12:00:00 via TOPICAL
  Filled 2018-12-20: qty 28

## 2018-12-20 NOTE — Progress Notes (Signed)
Paula Hartman is a 78 y.o. female who is admitted for CIR with weakness and gait adenopathy secondary to cauda equina syndrome  Past Medical History:  Diagnosis Date  . Allergic rhinitis   . Exposure to TB   . HTN (hypertension)   . Hyperlipidemia   . 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 No new problems except for a pruritic rash involving the right lateral neck area.  She feels that her back brace has irritated this area  Objective: Vital signs in last 24 hours: Temp:  [97.8 F (36.6 C)-98.5 F (36.9 C)] 97.8 F (36.6 C) (07/26 0534) Pulse Rate:  [59-78] 59 (07/26 0534) Resp:  [16-17] 16 (07/26 0534) BP: (126-148)/(44-48) 148/44 (07/26 0534) SpO2:  [97 %-100 %] 100 % (07/26 0534) Weight:  [52.5 kg] 52.5 kg (07/26 0534) Weight change:  Last BM Date: 12/19/18  Intake/Output from previous day: 07/25 0701 - 07/26 0700 In: 640 [P.O.:640] Out: -   Patient Vitals for the past 24 hrs:  BP Temp Temp src Pulse Resp SpO2 Weight  12/20/18 0534 (!) 148/44 97.8 F (36.6 C) - (!) 59 16 100 % 52.5 kg  12/19/18 1954 (!) 144/46 98.5 F (36.9 C) Oral 78 16 98 % -  12/19/18 1307 (!) 126/48 97.9 F (36.6 C) - (!) 59 17 97 % -     Physical Exam General: No apparent distress   HEENT: Moist mucosal membranes Lungs: Normal effort. Lungs clear to auscultation, no crackles or wheezes. Cardiovascular: Regular rate and rhythm, no edema Abdomen: S/NT/ND; BS(+) Musculoskeletal:  unchanged Neurological: No new neurological deficits with lower extremity weakness and sensory deficits Wounds: Back incision healing Skin: Erythema involving the right lateral neck area Mental state: Alert, oriented, cooperative    Lab Results: BMET    Component Value Date/Time   NA 138 12/14/2018 0725   K 3.9 12/14/2018 0725   CL 109 12/14/2018 0725   CO2 18 (L) 12/14/2018 0725   GLUCOSE 149 (H) 12/14/2018 0725   BUN 37 (H) 12/14/2018 0725   CREATININE 1.36 (H) 12/14/2018 0725   CALCIUM 9.5 12/14/2018 0725   GFRNONAA 37 (L) 12/14/2018 0725   GFRAA 43 (L) 12/14/2018 0725   CBC    Component Value Date/Time   WBC 6.3 12/14/2018 0725   RBC 3.64 (L) 12/14/2018 0725   HGB 10.6 (L) 12/14/2018 0725   HCT 34.4 (L) 12/14/2018 0725   PLT 478 (H) 12/14/2018 0725   MCV 94.5 12/14/2018 0725   MCH 29.1 12/14/2018 0725   MCHC 30.8 12/14/2018 0725   RDW 13.5 12/14/2018 0725   LYMPHSABS 1.6 12/06/2018 0719   MONOABS 0.6 12/06/2018 0719   EOSABS 0.4 12/06/2018 0719   BASOSABS 0.0 12/06/2018 0719    Medications: I have reviewed the patient's current medications.  Assessment/Plan:  Functional deficits secondary to cauda equina syndrome.  Continue CIR Hypertension.  Well-controlled on present regimen of metoprolol and nifedipine Rash right lateral neck area- suspect secondary to local irritation.  Will treat with short-term hydrocortisone cream    Length of stay, days: 16  Marletta Lor , MD 12/20/2018, 9:41 AM

## 2018-12-20 NOTE — Progress Notes (Signed)
Orthopedic Tech Progress Note Patient Details:  Paula Hartman 1940/09/10 [id number]  Patient ID: [name], female   DOB: 01-Mar-1941, 78 y.o.   MRN: [id number]   [name] 12/20/2018, 7:42 [facility] [employer] answering service for [employer].

## 2018-12-21 ENCOUNTER — Encounter (HOSPITAL_COMMUNITY): Payer: Medicare Other | Admitting: Occupational Therapy

## 2018-12-21 ENCOUNTER — Ambulatory Visit (HOSPITAL_COMMUNITY): Payer: Medicare Other | Admitting: Physical Therapy

## 2018-12-21 ENCOUNTER — Inpatient Hospital Stay (HOSPITAL_COMMUNITY): Payer: Medicare Other | Admitting: Occupational Therapy

## 2018-12-21 ENCOUNTER — Inpatient Hospital Stay (HOSPITAL_COMMUNITY): Payer: Medicare Other

## 2018-12-21 LAB — BASIC METABOLIC PANEL
Anion gap: 12 (ref 5–15)
BUN: 47 mg/dL — ABNORMAL HIGH (ref 8–23)
CO2: 20 mmol/L — ABNORMAL LOW (ref 22–32)
Calcium: 9.6 mg/dL (ref 8.9–10.3)
Chloride: 106 mmol/L (ref 98–111)
Creatinine, Ser: 1.26 mg/dL — ABNORMAL HIGH (ref 0.44–1.00)
GFR calc Af Amer: 47 mL/min — ABNORMAL LOW (ref >60)
GFR calc non Af Amer: 40 mL/min — ABNORMAL LOW (ref >60)
Glucose, Bld: 125 mg/dL — ABNORMAL HIGH (ref 70–99)
Potassium: 4.3 mmol/L (ref 3.5–5.1)
Sodium: 138 mmol/L (ref 135–145)

## 2018-12-21 LAB — CBC
HCT: 29.9 % — ABNORMAL LOW (ref 36.0–46.0)
Hemoglobin: 9.5 g/dL — ABNORMAL LOW (ref 12.0–15.0)
MCH: 29.6 pg (ref 26.0–34.0)
MCHC: 31.8 g/dL (ref 30.0–36.0)
MCV: 93.1 fL (ref 80.0–100.0)
Platelets: 374 10*3/uL (ref 150–400)
RBC: 3.21 MIL/uL — ABNORMAL LOW (ref 3.87–5.11)
RDW: 13.5 % (ref 11.5–15.5)
WBC: 5.6 10*3/uL (ref 4.0–10.5)
nRBC: 0 % (ref 0.0–0.2)

## 2018-12-21 MED ORDER — METOPROLOL TARTRATE 50 MG PO TABS: 50.0000 mg | ORAL_TABLET | Freq: Two times a day (BID) | ORAL | 1 refills | Status: AC

## 2018-12-21 MED ORDER — SENNA 8.6 MG PO TABS: 2.0000 | ORAL_TABLET | Freq: Every day | ORAL | 0 refills | Status: AC

## 2018-12-21 MED ORDER — HYDROCODONE-ACETAMINOPHEN 5-325 MG PO TABS: 1.0000 | ORAL_TABLET | ORAL | 0 refills | Status: DC | PRN

## 2018-12-21 MED ORDER — PREGABALIN 25 MG PO CAPS: 25.0000 mg | ORAL_CAPSULE | Freq: Every day | ORAL | 1 refills | Status: DC

## 2018-12-21 MED ORDER — HYDROCODONE-ACETAMINOPHEN 5-325 MG PO TABS
1.0000 | ORAL_TABLET | ORAL | Status: DC | PRN
  Administered 2018-12-22: 1 via ORAL
  Filled 2018-12-21: qty 1

## 2018-12-21 NOTE — Progress Notes (Signed)
[facility] PHYSICAL MEDICINE & REHABILITATION PROGRESS NOTE  Subjective/Complaints: Pt denies any problems. Excited to go home. Pain controlled  ROS: Patient denies fever, rash, sore throat, blurred vision, nausea, vomiting, diarrhea, cough, shortness of breath or chest pain, joint or back pain, headache, or mood change.    Objective: Vital Signs: Blood pressure (!) 139/49, pulse (!) 58, temperature 97.9 F (36.6 C), temperature source Oral, resp. rate 18, height 4\' 7"  (1.397 m), weight 52.5 kg, SpO2 99 %. No results found. Recent Labs    12/21/18 0721  WBC 5.6  HGB 9.5*  HCT 29.9*  PLT 374   Recent Labs    12/21/18 0721  NA 138  K 4.3  CL 106  CO2 20*  GLUCOSE 125*  BUN 47*  CREATININE 1.26*  CALCIUM 9.6    Physical Exam: BP (!) 139/49 (BP Location: Left Arm)   Pulse (!) 58   Temp 97.9 F (36.6 C) (Oral)   Resp 18   Ht 4\' 7"  (1.397 m)   Wt 52.5 kg   SpO2 99%   BMI 26.90 kg/m  Constitutional: No distress . Vital signs reviewed. HEENT: EOMI, oral membranes moist Neck: supple Cardiovascular: RRR without murmur. No JVD    Respiratory: CTA Bilaterally without wheezes or rales. Normal effort    GI: BS +, non-tender, non-distended  Musc: No edema or tenderness in extremities. Neurological: Alert and oriented Motor: B/l UE 5/5 proximal to distal, stable RLE: Hip flexion, knee extension 4-/5, ankle dorsiflexion 3+/5. LLE: HF, KE 3+/5, APF / ADF 2 to 2+---essentially stable Sensation diminished to light touch bilateral LE--can sense pain Skin: Back incision remains CDI Psychiatric: very pleasant  Assessment/Plan: 1. Functional deficits secondary to cauda equina syndrome which require 3+ hours per day of interdisciplinary therapy in a comprehensive inpatient rehab setting.  Physiatrist is providing close team supervision and 24 hour management of active medical problems listed below.  Physiatrist and rehab team continue to assess barriers to discharge/monitor  patient progress toward functional and medical goals  Care Tool:  Bathing  Bathing activity did not occur: Refused Body parts bathed by patient: Right arm, Left arm, Front perineal area, Chest, Abdomen, Face, Left upper leg, Right lower leg, Left lower leg, Buttocks, Right upper leg   Body parts bathed by helper: Buttocks, Right upper leg, Left lower leg, Right lower leg, Left upper leg     Bathing assist Assist Level: Set up assist     Upper Body Dressing/Undressing Upper body dressing   What is the patient wearing?: Pull over shirt, Orthosis Orthosis activity level: Performed by patient  Upper body assist Assist Level: Independent    Lower Body Dressing/Undressing Lower body dressing      What is the patient wearing?: Underwear/pull up, Pants     Lower body assist Assist for lower body dressing: Independent with assitive device     Toileting Toileting Toileting Activity did not occur (Clothing management and hygiene only): N/A (no void or bm)(i&o cath)  Toileting assist Assist for toileting: Independent with assistive device     Transfers Chair/bed transfer  Transfers assist     Chair/bed transfer assist level: Supervision/Verbal cueing Chair/bed transfer assistive device: Walker, Orthosis   Locomotion Ambulation   Ambulation assist      Assist level: Contact Guard/Touching assist Assistive device: Walker-rolling(and L AFO) Max distance: 200'   Walk 10 feet activity   Assist  Walk 10 feet activity did not occur: Safety/medical concerns  Assist level: Supervision/Verbal cueing Assistive device:  Walker-rolling, Orthosis   Walk 50 feet activity   Assist Walk 50 feet with 2 turns activity did not occur: Safety/medical concerns  Assist level: Contact Guard/Touching assist Assistive device: Orthosis, Walker-rolling    Walk 150 feet activity   Assist Walk 150 feet activity did not occur: Safety/medical concerns  Assist level: Contact  Guard/Touching assist Assistive device: Walker-rolling, Orthosis    Walk 10 feet on uneven surface  activity   Assist Walk 10 feet on uneven surfaces activity did not occur: Safety/medical concerns   Assist level: Minimal Assistance - Patient > 75% Assistive device: Aeronautical engineer Will patient use wheelchair at discharge?: Yes Type of Wheelchair: Manual    Wheelchair assist level: Independent Max wheelchair distance: 155ft    Wheelchair 50 feet with 2 turns activity    Assist        Assist Level: Independent   Wheelchair 150 feet activity     Assist     Assist Level: Independent      Medical Problem List and Plan: 1.  Gait abnormality, weakness, numbness secondary to cauda equina syndrome.  Continue CIR PT, OT   Patient to see Rehab MD/provider in the office for transitional care encounter in 1-2 weeks.   -dc home tomorrow 2.  Antithrombotics: -DVT/anticoagulation:  Mechanical: Sequential compression devices, below knee Bilateral lower extremities             -antiplatelet therapy: N/A 3. Pain Management: Oxycodone prn.    .   -cont lyrica 25mg  qhs ---stay on as outpt  - prn tylenol, heat    4. Mood: LCSW to follow for evaluation and support.              -antipsychotic agents: N/A 5. Neuropsych: This patient is capable of making decisions on her  own behalf. 6. Skin/Wound Care: Monitor wound daily for healing.  7. Fluids/Electrolytes/Nutrition: having been pushing fluids  -she's eating and drinking well  -recheck BUN monday 8. HTN: Monitor BP bid--continue Nifedipine daily with lopressor bid.     -reasonable control 7/27 . Tachycardia:  resolved . occ brady Vitals:   12/20/18 2011 12/21/18 0547  BP: (!) 141/54 (!) 139/49  Pulse: 68 (!) 58  Resp: 18 18  Temp: 97.6 F (36.4 C) 97.9 F (36.6 C)  SpO2: 99% 99%       10. Urinary retention:    UA equivocal, urine culture negative   -  emptying bladder more  completely 11.  Bowel incontinence:    -observe for patterns  Continue bowel meds --moving bowels regularly 12.  AKI  Creatinine 1.36 7/20  BUN up to 47. Need to push fluids  -discussed with patient   13.  Acute blood loss anemia  Hemoglobin 10.6 7/20  -recent transfusion 1u PRBC  -9.5 today, no symptoms or signs of gross blood loss  -follow up as outpt  LOS: 17 days A FACE TO [place] 12/21/2018, 10:40 AM

## 2018-12-21 NOTE — Progress Notes (Signed)
Occupational Therapy Discharge Summary  Patient Details  Name: Paula Hartman MRN: [id number] Date of Birth: Aug 12, 1940   Patient has met 8 of 8 long term goals due to improved activity tolerance, improved balance, postural control, ability to compensate for deficits and improved coordination.  Patient to discharge at overall Supervision standing, mod I w/c level.  Patient's care partner is independent to provide the necessary physical assistance at discharge.  Pt's daughter has completed hands on family education. She voices and demonstrates willing and ableness to provide needed assist at d/c. Pt is mod I from w/c level and for squat pivot transfers. Recommend supervision for standing tasks at RW or functional ambulation. Pt with hyperextension of L knee during ambulation, requires encouragement to wear AFO to assist.   Recommendation:  Patient will benefit from ongoing skilled OT services in outpatient setting to continue to advance functional skills in the area of BADL, iADL and Reduce care partner burden.  Equipment: Drop arm BSC and tub transfer bench  Reasons for discharge: treatment goals met and discharge from hospital  Patient/family agrees with progress made and goals achieved: Yes  OT Discharge Precautions/Restrictions  Precautions Precautions: Back;Fall Precaution Comments: reviewed spinal precautions Required Braces or Orthoses: Spinal Brace Spinal Brace: Thoracolumbosacral orthotic;Applied in sitting position Restrictions Weight Bearing Restrictions: No Vision Baseline Vision/History: Wears glasses Wears Glasses: Reading only Patient Visual Report: No change from baseline Vision Assessment?: No apparent visual deficits Perception  Perception: Within Functional Limits Praxis Praxis: Intact Cognition Overall Cognitive Status: Within Functional Limits for tasks assessed Arousal/Alertness: Awake/alert Orientation Level: Oriented X4 Attention: (P)  Sustained Sustained Attention: Appears intact Memory: Appears intact Awareness: Appears intact Problem Solving: Appears intact Safety/Judgment: Appears intact Sensation Sensation Light Touch: Impaired Detail Light Touch Impaired Details: Absent LLE Proprioception: Impaired Detail Proprioception Impaired Details: Absent LLE Coordination Gross Motor Movements are Fluid and Coordinated: No Fine Motor Movements are Fluid and Coordinated: Yes Coordination and Movement Description: L LE ataxia and proprioceptive deficits impacting coordination, greatly improved since admission Motor  Motor Motor: Paraplegia Motor - Skilled Clinical Observations: L>R Motor - Discharge Observations: (P) improved since eval, L>R weakness Mobility  Bed Mobility Bed Mobility: (P) Rolling Right;Rolling Left;Supine to Sit;Sit to Supine  Trunk/Postural Assessment  Cervical Assessment Cervical Assessment: Within Functional Limits Thoracic Assessment Thoracic Assessment: Exceptions to WFL(Back pre-cautions) Lumbar Assessment Lumbar Assessment: Exceptions to WFL(Back pre-cautions) Postural Control Postural Control: Within Functional Limits  Balance Balance Balance Assessed: Yes Static Sitting Balance Static Sitting - Balance Support: Feet supported;No upper extremity supported Static Sitting - Level of Assistance: 7: Independent Dynamic Sitting Balance Dynamic Sitting - Balance Support: No upper extremity supported;Feet supported;During functional activity Dynamic Sitting - Level of Assistance: 6: Modified independent (Device/Increase time) Sitting balance - Comments: Sitting EOB to dress Static Standing Balance Static Standing - Balance Support: Right upper extremity supported;Left upper extremity supported;During functional activity Static Standing - Level of Assistance: 5: Stand by assistance Dynamic Standing Balance Dynamic Standing - Balance Support: During functional activity;Right upper extremity  supported;Left upper extremity supported Dynamic Standing - Level of Assistance: 5: Stand by assistance;4: Min assist Extremity/Trunk Assessment RUE Assessment RUE Assessment: Within Functional Limits LUE Assessment LUE Assessment: Within Functional Limits   Marci Polito L 12/21/2018, 3:14 PM

## 2018-12-21 NOTE — Progress Notes (Signed)
Occupational Therapy Session Note  Patient Details  Name: Paula Hartman MRN: [id number] Date of Birth: Dec 04, 1940  Today's Date: 12/21/2018 OT Individual Time: 1000-1056 OT Individual Time Calculation (min): 56 min    Short Term Goals: Week 2:  OT Short Term Goal 1 (Week 2): STG=LTG due to LOS  Skilled Therapeutic Interventions/Progress Updates:    Treatment session with focus on ADL retraining in preparation for d/c home tomorrow.  Pt received supine in bed reporting pain in back 7/10.  RN arrived to provide meds at beginning of session.  Pt ambulated to bathroom with RW with supervision. Completed bathing and dressing with lateral leans on tub bench in room shower at overall Mod I level.  Pt demonstrated ability to don TLSO without assistance. Pt will require assistance when donning Lt shoe with AFO - however pt reports she does not plan to wear it at home.  Completed grooming tasks in sitting for energy conservation.  Discussed energy conservation strategies at home and safety when mobilizing with RW.  Completed tub/shower transfers in ADL apt from w/c and via RW.  Pt requires min cues for sequencing when removing leg rests.  Returned to room and pt transferred w/c > bed supervision for removal of leg rests.  Pt pleased with progress and ready for d/c tomorrow.  Therapy Documentation Precautions:  Precautions Precautions: Back, Fall Precaution Comments: reviewed spinal precautions Required Braces or Orthoses: Spinal Brace Spinal Brace: Thoracolumbosacral orthotic Restrictions Weight Bearing Restrictions: No Pain: Pain Assessment Pain Scale: 0-10 Pain Score: 7  Pain Location: Back Pain Orientation: Mid;Lower Patients Stated Pain Goal: 4 Pain Intervention(s): Medication (See eMAR)   Therapy/Group: Individual Therapy  Simonne Come 12/21/2018, 12:07 PM

## 2018-12-21 NOTE — Progress Notes (Signed)
Physical Therapy Discharge Summary  Patient Details  Name: Paula Hartman MRN: [id number] Date of Birth: January 03, 1941  Today's Date: 12/21/2018 PT Individual Time: 1400-1500 PT Individual Time Calculation (min): 60 min    Patient has met 7 of 7 long term goals due to improved activity tolerance, improved balance, increased strength, ability to compensate for deficits and functional use of  left lower extremity.  Patient to discharge at wheelchair level mod I and an ambulatory level Supervision with RW.Patient's care partner is independent to provide the necessary physical assistance at discharge.  Reasons goals not met: Pt has met or exceeded all rehab goals.  Recommendation:  Patient will benefit from ongoing skilled PT services in outpatient setting to continue to advance safe functional mobility, address ongoing impairments in endurance, strength, balance, safety, and minimize fall risk.  Equipment: 14x14 manual w/c, hospital bed  Reasons for discharge: treatment goals met and discharge from hospital  Patient/family agrees with progress made and goals achieved: Yes   Skilled Intervention: Pt received seated in therapy gym handed off from OT session. Pt's daughter present for hands-on family education session. Pt has no complaints of pain. Reviewed purpose of L AFO, pt agreeable to wear brace. Reviewed how to don/doff TLSO and L AFO. Pt and daughter demonstrate good understanding of how to perform this. Pt transfers with Supervision to RW and is able to ambulate up to 200 ft with RW and Supervision. Pt's daughter demonstrates good understanding of how to provide this level of assist to pt. Pt is independent with w/c mobility and management of w/c parts. Ascend/descend stairs laterally with L handrail and min A. Demonstrated how to assist pt with stairs and then had her daughter perform return demo. Car transfer with RW and Supervision. Pt and her daughter demonstrate good safety and  understanding of therapy recommendations for mobility and transfers at home. Reviewed HEP handout and added TKE exercise. Pt left seated in w/c in room with daughter present, safe to d/c home tomorrow.  PT Discharge Precautions/Restrictions Precautions Precautions: Back;Fall Precaution Comments: reviewed spinal precautions Required Braces or Orthoses: Spinal Brace Spinal Brace: Thoracolumbosacral orthotic;Applied in sitting position Restrictions Weight Bearing Restrictions: No Vision/Perception  Vision - History Baseline Vision: Wears glasses only for reading Perception Perception: Within Functional Limits Praxis Praxis: Intact  Cognition Overall Cognitive Status: Within Functional Limits for tasks assessed Arousal/Alertness: Awake/alert Orientation Level: Oriented X4 Attention: Sustained Sustained Attention: Appears intact Memory: Appears intact Awareness: Appears intact Problem Solving: Appears intact Safety/Judgment: Appears intact Sensation Sensation Light Touch: Impaired Detail Light Touch Impaired Details: Absent LLE Proprioception: Impaired Detail Proprioception Impaired Details: Absent LLE Coordination Gross Motor Movements are Fluid and Coordinated: No Fine Motor Movements are Fluid and Coordinated: Yes Coordination and Movement Description: L LE ataxia and proprioceptive deficits impacting coordination, greatly improved since admission Motor  Motor Motor: Paraplegia Motor - Skilled Clinical Observations: L>R Motor - Discharge Observations: improved since eval, L>R weakness  Mobility Bed Mobility Bed Mobility: Rolling Right;Rolling Left;Supine to Sit;Sit to Supine Rolling Right: Independent with assistive device Rolling Left: Independent with assistive device Supine to Sit: Independent with assistive device Sit to Supine: Independent with assistive device Transfers Transfers: Sit to Stand;Squat Pivot Transfers;Stand Pivot Transfers Sit to Stand:  Supervision/Verbal cueing Stand Pivot Transfers: Supervision/Verbal cueing Squat Pivot Transfers: Supervision/Verbal cueing Transfer (Assistive device): Rolling walker Locomotion  Gait Ambulation: Yes Gait Assistance: Supervision/Verbal cueing Gait Distance (Feet): 200 Feet Assistive device: Rolling walker Gait Assistance Details: Verbal cues for sequencing;Verbal cues for technique;Verbal cues for  precautions/safety;Verbal cues for gait pattern;Verbal cues for safe use of DME/AE Gait Gait: Yes Gait Pattern: Impaired Gait Pattern: Decreased step length - left;Decreased stance time - left;Decreased dorsiflexion - left;Left genu recurvatum;Poor foot clearance - left(improved with L AFO) Gait velocity: decreased Stairs / Additional Locomotion Stairs: Yes Stairs Assistance: Minimal Assistance - Patient > 75% Stair Management Technique: One rail Left;Sideways Number of Stairs: 12 Height of Stairs: 6 Wheelchair Mobility Wheelchair Mobility: Yes Wheelchair Assistance: Independent with Camera operator: Both upper extremities Wheelchair Parts Management: Independent Distance: 200  Trunk/Postural Assessment  Cervical Assessment Cervical Assessment: Within Functional Limits Thoracic Assessment Thoracic Assessment: Exceptions to WFL(Back pre-cautions) Lumbar Assessment Lumbar Assessment: Exceptions to WFL(Back pre-cautions) Postural Control Postural Control: Within Functional Limits  Balance Balance Balance Assessed: Yes Static Sitting Balance Static Sitting - Balance Support: Feet supported;No upper extremity supported Static Sitting - Level of Assistance: 7: Independent Dynamic Sitting Balance Dynamic Sitting - Balance Support: No upper extremity supported;Feet supported;During functional activity Dynamic Sitting - Level of Assistance: 6: Modified independent (Device/Increase time) Sitting balance - Comments: Sitting EOB to dress Static Standing  Balance Static Standing - Balance Support: Right upper extremity supported;Left upper extremity supported;During functional activity Static Standing - Level of Assistance: 5: Stand by assistance Dynamic Standing Balance Dynamic Standing - Balance Support: During functional activity;Right upper extremity supported;Left upper extremity supported Dynamic Standing - Level of Assistance: 5: Stand by assistance;4: Min assist Extremity Assessment  RUE Assessment RUE Assessment: Within Functional Limits LUE Assessment LUE Assessment: Within Functional Limits RLE Assessment RLE Assessment: Exceptions to [facility] Passive Range of Motion (PROM) Comments: [facility] General Strength Comments: impaired, see below RLE Strength Right Hip Flexion: 4/5 Right Knee Flexion: 3/5 Right Knee Extension: 4+/5 Right Ankle Dorsiflexion: 3/5 LLE Assessment LLE Assessment: Exceptions to [facility] Passive Range of Motion (PROM) Comments: WFL General Strength Comments: impaired, see below LLE Strength Left Hip Flexion: 3+/5 Left Knee Flexion: 4/5 Left Knee Extension: 4/5 Left Ankle Dorsiflexion: 2-/5     Excell Seltzer, PT, DPT 12/21/2018, 4:27 PM

## 2018-12-21 NOTE — Progress Notes (Signed)
Physical Therapy Session Note  Patient Details  Name: Paula Hartman MRN: [id number] Date of Birth: 07/13/1940  Today's Date: 12/21/2018 PT Individual Time: 4585-9292 PT Individual Time Calculation (min): 26 min   Short Term Goals: Week 2:  PT Short Term Goal 1 (Week 2): =LTG due to ELOS  Skilled Therapeutic Interventions/Progress Updates:    Pt performed bed mobility during session modified independent using bed rails for support adhering to and able to state 3/3 back precautions. Set-up assist for donning of TLSO with cues for appropriate positioning of brace and connecting of straps while seated EOB. Pt able to don R shoe and sock independently but requires max assist for L shoe/AFO. Pt performed sit to stand with RW with supervision for safety and cues for technique. Gait training on unit x 200' with close supervision to CGA with RW and L AFO with cues for posture, attention to L foot placement, and improved fluidity of gait pattern. Pt with no LOB during session noted. Discussed and educated importance of skin checks on L foot especially with use of AFO and impaired sensation. Discussed risk for skin breakdown and practiced skin inspection upon return to room when seated EOB without socks. Recommended to notify [facility] if brace was ill fitting or MD if issue on skin arose post discharge. Pt verbalized understanding and in agreement. Pt doffed brace and shoes, socks independently EOB and returned to supine before next session.   Therapy Documentation Precautions:  Precautions Precautions: Back, Fall Precaution Comments: reviewed spinal precautions Required Braces or Orthoses: Spinal Brace Spinal Brace: Thoracolumbosacral orthotic Restrictions Weight Bearing Restrictions: No Pain:  No complaints.     Therapy/Group: Individual Therapy  Canary Brim Ivory Broad, PT, DPT, CBIS  12/21/2018, 9:46 AM

## 2018-12-21 NOTE — Progress Notes (Signed)
Occupational Therapy Session Note  Patient Details  Name: Paula Hartman MRN: [id number] Date of Birth: 1940-06-15  Today's Date: 12/21/2018 OT Individual Time: 1300-1400 OT Individual Time Calculation (min): 60 min    Short Term Goals: Week 2:  OT Short Term Goal 1 (Week 2): STG=LTG due to LOS  Skilled Therapeutic Interventions/Progress Updates:    Pt seen for OT family education session with pt's daughter. Pt sitting upright in w/c upon arrival, agreeable to tx session and denying pain. Pt's daughter arrived shortly after for scheduled family ed in prep for d/c home tomorrow. See below for details of family ed session. Education provided regarding pt's CLOF, deficits and functional implications, DME, w/c parts management, donning/doffing of lumbar corset and AFO as well as wear schedule. Pt's daughter return demonstrated ability to assist with donning/doffing orthosis. Reviewed pt's CLOF, mod I squat pivot transfers and w/c level ADLs, supervision-CGA for standing level tasks using RW.  Completed squat pivot transfers w/c<> EOB, EOB<> drop arm BSC mod I.  Ambulation without OT apartment with close supervision using RW.  Simulated tub/shower transfer utilizing tub bench, completed with supervision.  Pt returned to therapy gym at end of session with hand off to PT.  Pt's daughter voiced understanding and agreement with all recommendations, voices feeling willing and able to provide needed assist at d/c.   Therapy Documentation Precautions:  Precautions Precautions: Back, Fall Precaution Comments: reviewed spinal precautions Required Braces or Orthoses: Spinal Brace Spinal Brace: Thoracolumbosacral orthotic Restrictions Weight Bearing Restrictions: No   Therapy/Group: Individual Therapy  Jean Skow L 12/21/2018, 7:03 AM

## 2018-12-22 DIAGNOSIS — R7989 Other specified abnormal findings of blood chemistry: Secondary | ICD-10-CM

## 2018-12-22 LAB — BASIC METABOLIC PANEL
Anion gap: 11 (ref 5–15)
BUN: 46 mg/dL — ABNORMAL HIGH (ref 8–23)
CO2: 20 mmol/L — ABNORMAL LOW (ref 22–32)
Calcium: 9 mg/dL (ref 8.9–10.3)
Chloride: 106 mmol/L (ref 98–111)
Creatinine, Ser: 1.43 mg/dL — ABNORMAL HIGH (ref 0.44–1.00)
GFR calc Af Amer: 40 mL/min — ABNORMAL LOW (ref >60)
GFR calc non Af Amer: 35 mL/min — ABNORMAL LOW (ref >60)
Glucose, Bld: 182 mg/dL — ABNORMAL HIGH (ref 70–99)
Potassium: 4.8 mmol/L (ref 3.5–5.1)
Sodium: 137 mmol/L (ref 135–145)

## 2018-12-22 NOTE — Plan of Care (Signed)
  Problem: Consults Goal: RH GENERAL PATIENT EDUCATION Description: See Patient Education module for education specifics. Outcome: Completed/Met Goal: Skin Care Protocol Initiated - if Braden Score 18 or less Description: If consults are not indicated, leave blank or document N/A Outcome: Completed/Met   Problem: RH BOWEL ELIMINATION Goal: RH STG MANAGE BOWEL WITH ASSISTANCE Description: STG Manage Bowel with mod I Assistance. Outcome: Completed/Met   Problem: RH BLADDER ELIMINATION Goal: RH STG MANAGE BLADDER WITH ASSISTANCE Description: STG Manage Bladder With mod I Assistance Outcome: Completed/Met   Problem: RH SKIN INTEGRITY Goal: RH STG SKIN FREE OF INFECTION/BREAKDOWN Description: Min assist Outcome: Completed/Met Goal: RH STG MAINTAIN SKIN INTEGRITY WITH ASSISTANCE Description: STG Maintain Skin Integrity With mod I Assistance. Outcome: Completed/Met   Problem: RH SAFETY Goal: RH STG ADHERE TO SAFETY PRECAUTIONS W/ASSISTANCE/DEVICE Description: STG Adhere to Safety Precautions With mod I Assistance/Device. Outcome: Completed/Met   Problem: RH PAIN MANAGEMENT Goal: RH STG PAIN MANAGED AT OR BELOW PT'S PAIN GOAL Description: Pain level less that 4 on scale of 0-10 Outcome: Completed/Met   Problem: RH KNOWLEDGE DEFICIT GENERAL Goal: RH STG INCREASE KNOWLEDGE OF SELF CARE AFTER HOSPITALIZATION Description: Pt will be able to direct care at discharge independently using educational handouts and resources Outcome: Completed/Met   Problem: Consults Goal: RH SPINAL CORD INJURY PATIENT EDUCATION Description:  See Patient Education module for education specifics.  Outcome: Completed/Met   Problem: SCI BOWEL ELIMINATION Goal: RH STG MANAGE BOWEL WITH ASSISTANCE Description: STG Manage Bowel with mod I Assistance. Outcome: Completed/Met Goal: RH STG SCI MANAGE BOWEL PROGRAM W/ASSIST OR AS APPROPRIATE Description: STG SCI Manage bowel program with mod I assist or as  appropriate. Outcome: Completed/Met   Problem: SCI BLADDER ELIMINATION Goal: RH STG MANAGE BLADDER WITH ASSISTANCE Description: STG Manage Bladder With mod I Assistance Outcome: Completed/Met   Problem: RH SKIN INTEGRITY Goal: RH STG MAINTAIN SKIN INTEGRITY WITH ASSISTANCE Description: STG Maintain Skin Integrity With mod I Assistance. Outcome: Completed/Met   Problem: RH SAFETY Goal: RH STG ADHERE TO SAFETY PRECAUTIONS W/ASSISTANCE/DEVICE Description: STG Adhere to Safety Precautions With cues and reminder Assistance/Device. Outcome: Completed/Met   Problem: RH PAIN MANAGEMENT Goal: RH STG PAIN MANAGED AT OR BELOW PT'S PAIN GOAL Description: Pain scale <4/10 Outcome: Completed/Met   Problem: RH KNOWLEDGE DEFICIT SCI Goal: RH STG INCREASE KNOWLEDGE OF SELF CARE AFTER SCI Outcome: Completed/Met

## 2018-12-22 NOTE — Progress Notes (Signed)
Recreational Therapy Discharge Summary Patient Details  Name: Paula Hartman MRN: [id number] Date of Birth: 1941/04/05 Today's Date: 12/22/2018  Long term goals set: 1  Long term goals met: 1  Comments on progress toward goals: Pt has made great progress during LOS and is ready for discharge home with family to provide 24 hour supervision/assitance.  TR sessions focused on activity tolerance, safety, energy conservation, activity analysis and identification of modifications for safe task completion.  Pt completes seated TR tasks with supervision/set up assistance- mod I  and requires supervision/verbal cues for standing tasks.  Pt participated in simulated community skills tasks with contact guard assistance but did experience 1 LOB stepping over an obstacle in which she required mod assist for balance recovery.  Pt is anxious to return home with family today!  Reasons for discharge: discharge from hospital  Patient/family agrees with progress made and goals achieved: Yes  Paula Hartman 12/22/2018, 8:26 AM

## 2018-12-22 NOTE — Progress Notes (Signed)
[place] PHYSICAL MEDICINE & REHABILITATION PROGRESS NOTE  Subjective/Complaints: Up in chair. Getting dressed. Tells me she drank "a lot " yesterday. Pain controlled  ROS: Patient denies fever, rash, sore throat, blurred vision, nausea, vomiting, diarrhea, cough, shortness of breath or chest pain,  headache, or mood change.    Objective: Vital Signs: Blood pressure (!) 152/45, pulse 60, temperature 98.6 F (37 C), resp. rate 14, height 4\' 7"  (1.397 m), weight 52.5 kg, SpO2 97 %. No results found. Recent Labs    12/21/18 0721  WBC 5.6  HGB 9.5*  HCT 29.9*  PLT 374   Recent Labs    12/21/18 0721  NA 138  K 4.3  CL 106  CO2 20*  GLUCOSE 125*  BUN 47*  CREATININE 1.26*  CALCIUM 9.6    Physical Exam: BP (!) 152/45 (BP Location: Right Arm)   Pulse 60   Temp 98.6 F (37 C)   Resp 14   Ht 4\' 7"  (1.397 m)   Wt 52.5 kg   SpO2 97%   BMI 26.90 kg/m  Constitutional: No distress . Vital signs reviewed. HEENT: EOMI, oral membranes moist Neck: supple Cardiovascular: RRR without murmur. No JVD    Respiratory: CTA Bilaterally without wheezes or rales. Normal effort    GI: BS +, non-tender, non-distended  Musc: No edema or tenderness in extremities. Neurological: Alert and oriented Motor: B/l UE 5/5 proximal to distal, stable RLE: Hip flexion, knee extension 4-/5, ankle dorsiflexion 3+/5. LLE: HF, KE 3+/5, APF / ADF 2 to 2+---stable exam Sensation diminished to light touch bilateral LE--can sense pain Skin: Back incision CDI Psychiatric: very pleasant  Assessment/Plan: 1. Functional deficits secondary to cauda equina syndrome which require 3+ hours per day of interdisciplinary therapy in a comprehensive inpatient rehab setting.  Physiatrist is providing close team supervision and 24 hour management of active medical problems listed below.  Physiatrist and rehab team continue to assess barriers to discharge/monitor patient progress toward functional and medical  goals  Care Tool:  Bathing  Bathing activity did not occur: Refused Body parts bathed by patient: Right arm, Left arm, Front perineal area, Chest, Abdomen, Face, Left upper leg, Right lower leg, Left lower leg, Buttocks, Right upper leg   Body parts bathed by helper: Buttocks, Right upper leg, Left lower leg, Right lower leg, Left upper leg     Bathing assist Assist Level: Set up assist     Upper Body Dressing/Undressing Upper body dressing   What is the patient wearing?: Pull over shirt, Orthosis Orthosis activity level: Performed by patient  Upper body assist Assist Level: Independent    Lower Body Dressing/Undressing Lower body dressing      What is the patient wearing?: Underwear/pull up, Pants     Lower body assist Assist for lower body dressing: Independent with assitive device     Toileting Toileting Toileting Activity did not occur (Clothing management and hygiene only): N/A (no void or bm)(i&o cath)  Toileting assist Assist for toileting: Independent with assistive device     Transfers Chair/bed transfer  Transfers assist     Chair/bed transfer assist level: Supervision/Verbal cueing Chair/bed transfer assistive device: Walker, Orthosis   Locomotion Ambulation   Ambulation assist      Assist level: Supervision/Verbal cueing Assistive device: Walker-rolling Max distance: 200'   Walk 10 feet activity   Assist  Walk 10 feet activity did not occur: Safety/medical concerns  Assist level: Supervision/Verbal cueing Assistive device: Walker-rolling, Orthosis   Walk 50 feet activity  Assist Walk 50 feet with 2 turns activity did not occur: Safety/medical concerns  Assist level: Supervision/Verbal cueing Assistive device: Orthosis, Walker-rolling    Walk 150 feet activity   Assist Walk 150 feet activity did not occur: Safety/medical concerns  Assist level: Supervision/Verbal cueing Assistive device: Walker-rolling, Orthosis    Walk  10 feet on uneven surface  activity   Assist Walk 10 feet on uneven surfaces activity did not occur: Safety/medical concerns   Assist level: Minimal Assistance - Patient > 75% Assistive device: Aeronautical engineer Will patient use wheelchair at discharge?: Yes Type of Wheelchair: Manual    Wheelchair assist level: Independent Max wheelchair distance: 152ft    Wheelchair 50 feet with 2 turns activity    Assist        Assist Level: Independent   Wheelchair 150 feet activity     Assist     Assist Level: Independent      Medical Problem List and Plan: 1.  Gait abnormality, weakness, numbness secondary to cauda equina syndrome.  Continue CIR PT, OT   Patient to see Rehab MD/provider in the office for transitional care encounter in 1-2 weeks.   -dc home tomorrow 2.  Antithrombotics: -DVT/anticoagulation:  Mechanical: Sequential compression devices, below knee Bilateral lower extremities             -antiplatelet therapy: N/A 3. Pain Management: Oxycodone prn.    .   -cont lyrica 25mg  qhs ---stay on as outpt  - prn tylenol, heat    4. Mood: LCSW to follow for evaluation and support.              -antipsychotic agents: N/A 5. Neuropsych: This patient is capable of making decisions on her  own behalf. 6. Skin/Wound Care: Monitor wound daily for healing.  7. Fluids/Electrolytes/Nutrition: having been pushing fluids  -she's eating and drinking well  -BUN was increased yesterday. 47. Spoke to her later about pushing fluids. BMET ordered for this morning in follow up 8. HTN: Monitor BP bid--continue Nifedipine daily with lopressor bid.     -reasonable control 7/27 . Tachycardia:  resolved . occ brady Vitals:   12/21/18 2148 12/22/18 0536  BP:  (!) 152/45  Pulse: 61 60  Resp:  14  Temp:  98.6 F (37 C)  SpO2:  97%       10. Urinary retention:    improved 11.  Bowel incontinence:    -observe for patterns  Continue bowel meds  --moving bowels regularly 12.  AKI  Creatinine 1.36 7/20  BUN up to 47. Pushing fluids, labs pending 13.  Acute blood loss anemia  Hemoglobin 10.6 7/20  -recent transfusion 1u PRBC  -9.5 tno symptoms or signs of gross blood loss  -follow up as outpt  LOS: 18 days A FACE TO [place] 12/22/2018, 8:44 AM

## 2018-12-22 NOTE — Progress Notes (Signed)
Social Work Discharge Note   The overall goal for the admission was met for:   Discharge location: Yes - home with dtr  Length of Stay: Yes - 18 days  Discharge activity level: Yes - mod I with wheelchair and supervision with ambulation  Home/community participation: Yes  Services provided included: MD, RD, PT, OT, RN, Pharmacy and SW  Financial Services: Medicare and Other: [employer]  Follow-up services arranged: Outpatient: PT/OT at [facility], DME: [phone]" hemi height w/c with basic cushion; tub transfer bench; drop arm commode; hospital bed from [facility] and Patient/Family has no preference for HH/DME agencies  Comments (or additional information): Pt's dtr came in for family education and is prepared to provide necessary care at home.  Pt pleased with progress and excited to go home.  Patient/Family verbalized understanding of follow-up arrangements: Yes  Individual responsible for coordination of the follow-up plan: pt and her [name] [phone] (h); [phone] (m)  Confirmed correct DME delivered: Trey Sailors 12/22/2018    Parissa Chiao, Silvestre Mesi

## 2018-12-22 NOTE — Discharge Instructions (Signed)
Inpatient Rehab Discharge Instructions  Paula Hartman Discharge date and time:  12/21/28  Activities/Precautions/ Functional Status: Activity: no lifting, driving, or strenuous exercise  till cleared by MD Diet: low fat, low cholesterol diet Wound Care: keep wound clean and dry    Functional status:  ___ No restrictions     ___ Walk up steps independently _X__ 24/7 supervision/assistance   ___ Walk up steps with assistance ___ Intermittent supervision/assistance  ___ Bathe/dress independently ___ Walk with walker     ___ Bathe/dress with assistance ___ Walk Independently    ___ Shower independently ___ Walk with assistance    _X__ Shower with assistance _X__ No alcohol     ___ Return to work/school ________    COMMUNITY REFERRALS UPON DISCHARGE:   Outpatient Rehabilitation:   PT     OT    Agency:  [facility] Phone:  [phone]             Appointments:  12-29-18 8:45am PT  01-05-19 9:30am OT Medical Equipment/Items Ordered:  14"x14" hemi-height, lightweight wheelchair with basic cushion; drop arm commode; hospital bed  Agency/Supplier:  [facility]       Phone:  [phone]   Special Instructions: 1. Need to drink plenty of fluids --you are getting dehydrated.    My questions have been answered and I understand these instructions. I will adhere to these goals and the provided educational materials after my discharge from the hospital.  Patient/Caregiver Signature _______________________________ Date __________  Clinician Signature _______________________________________ Date __________  Please bring this form and your medication list with you to all your follow-up doctor's appointments.

## 2018-12-22 NOTE — Progress Notes (Signed)
Pt discharge home with family. Belongings sent with pt. Discharge instructions given by Jeannene Patella, PA. Pain med given prior to discharge. No further questions from pt or family.   Gerald Stabs, RN

## 2018-12-22 NOTE — Discharge Summary (Signed)
Physician Discharge Summary  Patient ID: Paula Hartman MRN: [id number] DOB/AGE: [birth year] 78 y.o.  Admit date: 12/04/2018 Discharge date: 12/22/2018  Discharge Diagnoses:  Principal Problem:   Cauda equina syndrome [facility]) Active Problems:   Radiculopathy   Neuropathic pain   Acute blood loss anemia   Acute on chronic renal failure (HCC)   Benign essential HTN   Discharged Condition: stable   Significant Diagnostic Studies: N/A   Labs:  Basic Metabolic Panel: BMP Latest Ref Rng & Units 12/22/2018 12/21/2018 12/14/2018  Glucose 70 - 99 mg/dL 182(H) 125(H) 149(H)  BUN 8 - 23 mg/dL 46(H) 47(H) 37(H)  Creatinine 0.44 - 1.00 mg/dL 1.43(H) 1.26(H) 1.36(H)  Sodium 135 - 145 mmol/L 137 138 138  Potassium 3.5 - 5.1 mmol/L 4.8 4.3 3.9  Chloride 98 - 111 mmol/L 106 106 109  CO2 22 - 32 mmol/L 20(L) 20(L) 18(L)  Calcium 8.9 - 10.3 mg/dL 9.0 9.6 9.5    CBC: CBC Latest Ref Rng & Units 12/21/2018 12/14/2018 12/07/2018  WBC 4.0 - 10.5 K/uL 5.6 6.3 7.6  Hemoglobin 12.0 - 15.0 g/dL 9.5(L) 10.6(L) 10.0(L)  Hematocrit 36.0 - 46.0 % 29.9(L) 34.4(L) 32.2(L)  Platelets 150 - 400 K/uL 374 478(H) 411(H)    CBG: No results for input(s): GLUCAP in the last 168 hours.  Brief HPI:   Paula Hartman is a 78 year old female with history of HTN, CKD, LLE weakness and numbness for 3 weeks prior to admission with difficulty walking.  She was diagnosed with L3-L4 HNP with plans for surgery however developed difficulty voiding with bladder and bowel incontinence and saddle numbness therefore admitted via ED on 12/02/2018.  Repeat MRI spine showed disc herniation including spinal canal and she was taken to the OR emergently for L3-L4 decompressive laminectomy with left transforaminal and right posterior lateral fusion by Dr. Lynann Bologna.  Postop continued to have persistent numbness and weakness left lower extremity as well as problems with urinary retention.  Therapy evaluations revealed deficits in ADLs and  mobility.  CIR was recommended due to recent functional decline.   Hospital Course: Paula Hartman was admitted to rehab 12/04/2018 for inpatient therapies to consist of PT and OT at least three hours five days a week. Past admission physiatrist, therapy team and rehab RN have worked together to provide customized collaborative inpatient rehab.  Voiding was monitored with PVR checks and urine culture done was negative for infection.  Her bladder function has improved and she is currently voiding without difficulty.  She was started on bowel program and bowel incontinence has resolved.  Follow-up CBC showed acute blood loss anemia which is stable.  Stool guaiacs X 7 was negative for occult blood. Serial checks of electrolytes shows acute on chronic renal failure and patient was encouraged to increase fluid intake.  Recommend recheck renal status in 5 to 7 days and PCP to contact patient with appointment.  Blood pressures have been monitored on twice daily basis and are currently controlled.  Back incision is C/D/has been healing well without any signs or symptoms of infection.    Gabapentin was discontinued due to concerns of side effects to low-dose Lyrica was added to help with neuropathy LLE.  Pain is reasonably controlled with decreasing use of narcotics she was transitioned back to hydrocodone at discharge.  She has had improvement in LLE strength and AFO was ordered to help with left foot drop.  She has made good gains during her rehab stay and has exceeded all set goals.  She  is is modified independent at wheelchair level.  She requires supervision with mobility.  She will continue to receive further follow-up outpatient PT and OT at [facility] outpatient rehab after discharge   Rehab course: During patient's stay in rehab weekly team conferences were held to monitor patient's progress, set goals and discuss barriers to discharge. At admission, patient required mod assist with mobility and max  assist with basic self-care tasks.  She  has had improvement in activity tolerance, balance, postural control as well as ability to compensate for deficits. She has had improvement in functional use LLE  as well as improvement in awareness.  She is able to complete ADL tasks with supervision She is modified independent for transfers and is able to ambulate 200 feet with rolling walker and supervision. She is able to don TLSO without assistance.  Family education as well as HEP completed with daughter.     Disposition: Home  Diet: Regular.   Special Instructions: 1. Needs to increase fluid intake.  2. Will need BMET rechecked in 5-7 days to monitor for improvement.  3. Continue back precautions. Wear TLSO when out of bed. No driving till cleared by NS.    Allergies as of 12/22/2018   No Known Allergies     Medication List    STOP taking these medications   diazepam 5 MG tablet Commonly known as: VALIUM   gabapentin 300 MG capsule Commonly known as: NEURONTIN   oxyCODONE-acetaminophen 5-325 MG tablet Commonly known as: PERCOCET/ROXICET     TAKE these medications   acetaminophen 325 MG tablet Commonly known as: TYLENOL Take 2 tablets (650 mg total) by mouth 4 (four) times daily -  before meals and at bedtime.   bisacodyl 10 MG suppository Commonly known as: DULCOLAX Place 1 suppository (10 mg total) rectally daily as needed for moderate constipation.   HYDROcodone-acetaminophen 5-325 MG tablet Commonly known as: NORCO/VICODIN Take 1 tablet by mouth every 4 (four) hours as needed for severe pain.   metoprolol tartrate 50 MG tablet Commonly known as: LOPRESSOR Take 1 tablet (50 mg total) by mouth 2 (two) times daily.   Muscle Rub 10-15 % Crea Apply 1 application topically 3 (three) times daily with meals.   NIFEdipine 90 MG 24 hr tablet Commonly known as: PROCARDIA XL/NIFEDICAL-XL Take 90 mg by mouth daily.   pravastatin 40 MG tablet Commonly known as: PRAVACHOL Take  40 mg by mouth at bedtime.   pregabalin 25 MG capsule Commonly known as: LYRICA Take 1 capsule (25 mg total) by mouth at bedtime. Notes to patient: For neuropathy/numbness   senna 8.6 MG Tabs tablet Commonly known as: SENOKOT Take 2 tablets (17.2 mg total) by mouth daily after supper. Notes to patient: For constipation      Follow-up Information    Meredith Staggers, MD Follow up.   Specialty: Physical Medicine and Rehabilitation Why: Office will call you with follow up appointment Contact information: [address] [place] [address] [phone]        Phylliss Bob, MD. Call on 01/06/2019.   Specialty: Orthopedic Surgery Why: Be there at 8:15 am for post op check.  Contact information: [place] [address] [phone]        Lujean Amel, MD. Call.   Specialty: Family Medicine Why: the office will call you to have labs drawn this week. Call them tomorrow to make a post hospital follow up visit.  It may need to be a  virutal visit. Contact information: [place] [address] [phone]        Rexene Agent, MD. Call.   Specialty: Nephrology Why: as needed Contact information: [place] [address] [phone]           Signed: Bary Leriche 12/23/2018, 5:42 PM

## 2018-12-25 ENCOUNTER — Telehealth: Payer: Self-pay | Admitting: Registered Nurse

## 2018-12-25 DIAGNOSIS — N183 Chronic kidney disease, stage 3 (moderate): Secondary | ICD-10-CM | POA: Diagnosis not present

## 2018-12-25 DIAGNOSIS — D631 Anemia in chronic kidney disease: Secondary | ICD-10-CM | POA: Diagnosis not present

## 2018-12-25 NOTE — Progress Notes (Signed)
Patient ID: Paula Hartman, female   DOB: Aug 14, 1940, 78 y.o.   MRN: [id number]      Diagnosis codes:  G83.4; M43.16; M51.16; M48.061; Z47.89  Height:      4'7"          Weight:      119 lbs      Patient suffers from Cauda equina syndrome s/p decompression and fusion which impairs her ability to perform daily activities like bathing, dressing, grooming, toileting, and feeding in the home.  A walker or cane will not resolve issue with performing activities of daily living.  A wheelchair will allow patient to safely perform daily activities.  Patient is not able to propel herself in the home using a standard weight wheelchair due to endurance and arm weakness.  Patient can self propel in the lightweight wheelchair.  A face to face evaluation evaluation was performed and this wheelchair is appropriate for patient for use inside this home.

## 2018-12-25 NOTE — Telephone Encounter (Signed)
Transitional Care call Ms. Paula Hartman ( Daughter answered Questions)  Patient name: Paula Hartman DOB: 1941-01-08 1. Are you/is patient experiencing any problems since coming home? No  a. Are there any questions regarding any aspect of care? No 2. Are there any questions regarding medications administration/dosing? No a. Are meds being taken as prescribed? Yes b. "Patient should review meds with caller to confirm" Medication list Reviewed.  3. Have there been any falls? No 4. Has Home Health been to the house and/or have they contacted you? Has evaluation appointments with Neuro Rehabilitation.  a. If not, have you tried to contact them? NA b. Can we help you contact them? NA 5. Are bowels and bladder emptying properly? Yes a. Are there any unexpected incontinence issues? No b. If applicable, is patient following bowel/bladder programs? NA 6. Any fevers, problems with breathing, unexpected pain? No 7. Are there any skin problems or new areas of breakdown? No 8. Has the patient/family member arranged specialty MD follow up (ie cardiology/neurology/renal/surgical/etc.)?  All HFU appointments re scheduled except for HFU with Neurology. Ms. Paula Hartman will call Dr. Joelyn Hartman office to schedule appointment.  a. Can we help arrange? NA 9. Does the patient need any other services or support that we can help arrange? No 10. Are caregivers following through as expected in assisting the patient? Yes 11. Has the patient quit smoking, drinking alcohol, or using drugs as recommended? Ms. Paula Hartman reports her mother Paula Hartman doesn't smoke, drink alcohol or use illicit drugs.   Appointment date/time 12/31/2018  arrival time 1:15 for 1;40 appointment, with Bayard Hugger ANP-C. At [place]

## 2018-12-28 DIAGNOSIS — I10 Essential (primary) hypertension: Secondary | ICD-10-CM | POA: Diagnosis not present

## 2018-12-28 DIAGNOSIS — D631 Anemia in chronic kidney disease: Secondary | ICD-10-CM | POA: Diagnosis not present

## 2018-12-28 DIAGNOSIS — N183 Chronic kidney disease, stage 3 (moderate): Secondary | ICD-10-CM | POA: Diagnosis not present

## 2018-12-29 ENCOUNTER — Ambulatory Visit: Payer: Medicare Other | Attending: Physical Medicine and Rehabilitation | Admitting: Physical Therapy

## 2018-12-29 ENCOUNTER — Encounter: Payer: Self-pay | Admitting: Physical Therapy

## 2018-12-29 ENCOUNTER — Other Ambulatory Visit: Payer: Self-pay

## 2018-12-29 DIAGNOSIS — M6281 Muscle weakness (generalized): Secondary | ICD-10-CM | POA: Diagnosis not present

## 2018-12-29 DIAGNOSIS — M25512 Pain in left shoulder: Secondary | ICD-10-CM | POA: Diagnosis not present

## 2018-12-29 DIAGNOSIS — R2689 Other abnormalities of gait and mobility: Secondary | ICD-10-CM | POA: Diagnosis not present

## 2018-12-29 DIAGNOSIS — R2681 Unsteadiness on feet: Secondary | ICD-10-CM | POA: Diagnosis not present
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
## 2018-12-31 ENCOUNTER — Encounter: Payer: Medicare Other | Attending: Registered Nurse | Admitting: Registered Nurse

## 2018-12-31 ENCOUNTER — Other Ambulatory Visit: Payer: Self-pay

## 2018-12-31 ENCOUNTER — Encounter: Payer: Self-pay | Admitting: Registered Nurse

## 2018-12-31 VITALS — BP 162/71 | HR 63 | Temp 97.7°F | Resp 16

## 2018-12-31 DIAGNOSIS — G834 Cauda equina syndrome: Secondary | ICD-10-CM | POA: Diagnosis not present

## 2018-12-31 DIAGNOSIS — I10 Essential (primary) hypertension: Secondary | ICD-10-CM | POA: Insufficient documentation

## 2018-12-31 DIAGNOSIS — M792 Neuralgia and neuritis, unspecified: Secondary | ICD-10-CM | POA: Diagnosis not present

## 2018-12-31 DIAGNOSIS — E785 Hyperlipidemia, unspecified: Secondary | ICD-10-CM | POA: Insufficient documentation
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
## 2019-01-04 ENCOUNTER — Ambulatory Visit: Payer: Medicare Other | Admitting: Physical Therapy

## 2019-01-04 ENCOUNTER — Other Ambulatory Visit: Payer: Self-pay

## 2019-01-04 DIAGNOSIS — M25512 Pain in left shoulder: Secondary | ICD-10-CM | POA: Diagnosis not present

## 2019-01-04 DIAGNOSIS — M6281 Muscle weakness (generalized): Secondary | ICD-10-CM

## 2019-01-04 DIAGNOSIS — R2689 Other abnormalities of gait and mobility: Secondary | ICD-10-CM

## 2019-01-04 DIAGNOSIS — R2681 Unsteadiness on feet: Secondary | ICD-10-CM | POA: Diagnosis not present
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
## 2019-01-05 ENCOUNTER — Ambulatory Visit: Payer: Medicare Other | Admitting: Occupational Therapy

## 2019-01-05 DIAGNOSIS — R2689 Other abnormalities of gait and mobility: Secondary | ICD-10-CM | POA: Diagnosis not present

## 2019-01-05 DIAGNOSIS — M25512 Pain in left shoulder: Secondary | ICD-10-CM | POA: Diagnosis not present

## 2019-01-05 DIAGNOSIS — R2681 Unsteadiness on feet: Secondary | ICD-10-CM | POA: Diagnosis not present

## 2019-01-05 DIAGNOSIS — M6281 Muscle weakness (generalized): Secondary | ICD-10-CM
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
## 2019-01-06 ENCOUNTER — Ambulatory Visit: Payer: Medicare Other | Admitting: Physical Therapy

## 2019-01-06 ENCOUNTER — Encounter: Payer: Self-pay | Admitting: Physical Therapy

## 2019-01-06 ENCOUNTER — Ambulatory Visit: Payer: Medicare Other | Admitting: Occupational Therapy

## 2019-01-06 ENCOUNTER — Other Ambulatory Visit: Payer: Self-pay

## 2019-01-06 DIAGNOSIS — R2689 Other abnormalities of gait and mobility: Secondary | ICD-10-CM

## 2019-01-06 DIAGNOSIS — M6281 Muscle weakness (generalized): Secondary | ICD-10-CM | POA: Diagnosis not present

## 2019-01-06 DIAGNOSIS — R2681 Unsteadiness on feet: Secondary | ICD-10-CM

## 2019-01-06 DIAGNOSIS — M25512 Pain in left shoulder: Secondary | ICD-10-CM

## 2019-01-06 DIAGNOSIS — M545 Low back pain: Secondary | ICD-10-CM | POA: Diagnosis not present

## 2019-01-06 DIAGNOSIS — M5416 Radiculopathy, lumbar region: Secondary | ICD-10-CM | POA: Diagnosis not present

## 2019-01-06 DIAGNOSIS — Z9889 Other specified postprocedural states: Secondary | ICD-10-CM | POA: Diagnosis not present
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
## 2019-01-06 NOTE — Patient Instructions (Addendum)
Access Code: [email or web address]  URL: [email or web address]  Date: 01/06/2019  Prepared by: Janann August   Exercises Sit to Stand with Counter Support - 5 reps - 2 sets - 2x daily - 7x weekly (RW in front of patient) Standing March with Counter Support - 10 reps - 2 sets - 2x daily - 7x weekly (rolling walker support and chair behind patient) Seated Heel Slide - 10 reps - 2 sets - 2x daily - 7x weekly Seated Long Arc Quad - 10 reps - 2 sets - 2x daily - 7x weekly

## 2019-01-06 NOTE — Patient Instructions (Signed)
   Strengthening: Resisted Flexion   Hold tubing with __both___ arm(s) at side. Pull forward and up. Move shoulder through pain-free range of motion. Repeat __10__ times per set.  Do _1-2_ sessions per day , every other day   Strengthening: Resisted Extension   Hold tubing in __both___ hand(s), arm forward. Pull arm back, elbow straight. Repeat _10___ times per set. Do _1-2___ sessions per day, every other day.   Resisted Horizontal Abduction: Bilateral   Sit or stand, tubing in both hands, arms out in front. Keeping arms straight, pinch shoulder blades together and stretch arms out. Repeat _10___ times per set. Do _1-2___ sessions per day, every other day.   Elbow Flexion: Resisted   With tubing held in ___both___ hand(s) and other end secured under foot, curl arm up as far as possible. Repeat _10___ times per set. Do _1-2___ sessions per day, every other day.    Elbow Extension: Resisted   Sit in chair with resistive band secured at armrest (or hold with other hand at shoulder level) and __Rt, then Lt_____ elbow bent. Straighten elbow. Repeat _10___ times per set.  Do _1-2___ sessions per day, every other day.   Copyright  VHI. All rights reserved.

## 2019-01-13 ENCOUNTER — Other Ambulatory Visit: Payer: Self-pay

## 2019-01-13 ENCOUNTER — Encounter: Payer: Self-pay | Admitting: Occupational Therapy

## 2019-01-13 ENCOUNTER — Ambulatory Visit: Payer: Medicare Other | Admitting: Physical Therapy

## 2019-01-13 ENCOUNTER — Ambulatory Visit: Payer: Medicare Other | Admitting: Occupational Therapy

## 2019-01-13 DIAGNOSIS — R2689 Other abnormalities of gait and mobility: Secondary | ICD-10-CM | POA: Diagnosis not present

## 2019-01-13 DIAGNOSIS — R2681 Unsteadiness on feet: Secondary | ICD-10-CM

## 2019-01-13 DIAGNOSIS — M6281 Muscle weakness (generalized): Secondary | ICD-10-CM

## 2019-01-13 DIAGNOSIS — M25512 Pain in left shoulder: Secondary | ICD-10-CM | POA: Diagnosis not present
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
## 2019-01-15 ENCOUNTER — Ambulatory Visit: Payer: Medicare Other | Admitting: Occupational Therapy

## 2019-01-15 ENCOUNTER — Encounter: Payer: Self-pay | Admitting: Occupational Therapy

## 2019-01-15 ENCOUNTER — Other Ambulatory Visit: Payer: Self-pay

## 2019-01-15 ENCOUNTER — Ambulatory Visit: Payer: Medicare Other | Admitting: Physical Therapy

## 2019-01-15 DIAGNOSIS — M6281 Muscle weakness (generalized): Secondary | ICD-10-CM

## 2019-01-15 DIAGNOSIS — M25512 Pain in left shoulder: Secondary | ICD-10-CM

## 2019-01-15 DIAGNOSIS — R2681 Unsteadiness on feet: Secondary | ICD-10-CM | POA: Diagnosis not present

## 2019-01-15 DIAGNOSIS — R2689 Other abnormalities of gait and mobility: Secondary | ICD-10-CM | POA: Diagnosis not present
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
## 2019-01-15 NOTE — Patient Instructions (Addendum)
Buttocks Squeeze    Scoot out to the edge of your chair and sit tall.  Squeeze buttocks and hold __3__ seconds, then relax. Repeat __10__ times, 2 sets. Do __1__ sessions per day.  http://gt2.exer.us/610   Copyright  VHI. All rights reserved.  Adduction: Hip - Knees Together (Sitting)    Sit with towel roll between knees. Push knees together. Hold for _3__ seconds. Rest for __3_ seconds. Repeat 10___ times, 2 sets. Do _1__ times a day.  Copyright  VHI. All rights reserved.
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
## 2019-01-18 ENCOUNTER — Other Ambulatory Visit: Payer: Self-pay

## 2019-01-18 ENCOUNTER — Ambulatory Visit: Payer: Medicare Other | Admitting: Physical Therapy

## 2019-01-18 ENCOUNTER — Ambulatory Visit: Payer: Medicare Other | Admitting: Occupational Therapy

## 2019-01-18 DIAGNOSIS — M6281 Muscle weakness (generalized): Secondary | ICD-10-CM

## 2019-01-18 DIAGNOSIS — M25512 Pain in left shoulder: Secondary | ICD-10-CM | POA: Diagnosis not present

## 2019-01-18 DIAGNOSIS — R2689 Other abnormalities of gait and mobility: Secondary | ICD-10-CM

## 2019-01-18 DIAGNOSIS — R2681 Unsteadiness on feet: Secondary | ICD-10-CM | POA: Diagnosis not present
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
## 2019-01-20 ENCOUNTER — Ambulatory Visit: Payer: Medicare Other | Admitting: Occupational Therapy

## 2019-01-20 ENCOUNTER — Ambulatory Visit: Payer: Medicare Other | Admitting: Physical Therapy

## 2019-01-20 ENCOUNTER — Other Ambulatory Visit: Payer: Self-pay

## 2019-01-20 DIAGNOSIS — R2689 Other abnormalities of gait and mobility: Secondary | ICD-10-CM | POA: Diagnosis not present

## 2019-01-20 DIAGNOSIS — M25512 Pain in left shoulder: Secondary | ICD-10-CM | POA: Diagnosis not present

## 2019-01-20 DIAGNOSIS — R2681 Unsteadiness on feet: Secondary | ICD-10-CM | POA: Diagnosis not present

## 2019-01-20 DIAGNOSIS — M6281 Muscle weakness (generalized): Secondary | ICD-10-CM | POA: Diagnosis not present
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
## 2019-01-26 ENCOUNTER — Other Ambulatory Visit: Payer: Self-pay

## 2019-01-26 ENCOUNTER — Encounter: Payer: Self-pay | Admitting: Occupational Therapy

## 2019-01-26 ENCOUNTER — Ambulatory Visit: Payer: Medicare Other | Admitting: Occupational Therapy

## 2019-01-26 ENCOUNTER — Ambulatory Visit: Payer: Medicare Other | Attending: Physical Medicine and Rehabilitation | Admitting: Physical Therapy

## 2019-01-26 DIAGNOSIS — M6281 Muscle weakness (generalized): Secondary | ICD-10-CM

## 2019-01-26 DIAGNOSIS — M25512 Pain in left shoulder: Secondary | ICD-10-CM

## 2019-01-26 DIAGNOSIS — R2681 Unsteadiness on feet: Secondary | ICD-10-CM | POA: Insufficient documentation

## 2019-01-26 DIAGNOSIS — R2689 Other abnormalities of gait and mobility: Secondary | ICD-10-CM | POA: Insufficient documentation
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
## 2019-01-27 ENCOUNTER — Telehealth: Payer: Self-pay | Admitting: Physical Therapy

## 2019-01-27 NOTE — Telephone Encounter (Signed)
Spoke with Jinny Blossom at [employer], who states that patient has appointment next week in their office to look at her AFO.  PT states that pt has not been wearing her brace, as she feels it is uncomfortable, and as PT noticed, she is in significant hyperextension of L knee in stance, keeping foot in dorsiflexion such that toes are in air and full foot does not make contact with ground.    Megan plans to assess her current AFO and make changes as necessary.  She may be present for 02/09/2019 PT visit to follow up on changes made to AFO.  Mady Haagensen, PT 01/27/19 4:04 PM Phone: [phone] Fax: [phone]
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
## 2019-01-29 ENCOUNTER — Encounter: Payer: Self-pay | Admitting: Occupational Therapy

## 2019-01-29 ENCOUNTER — Ambulatory Visit: Payer: Medicare Other | Admitting: Occupational Therapy

## 2019-01-29 ENCOUNTER — Other Ambulatory Visit: Payer: Self-pay

## 2019-01-29 ENCOUNTER — Ambulatory Visit: Payer: Medicare Other | Admitting: Physical Therapy

## 2019-01-29 DIAGNOSIS — R2689 Other abnormalities of gait and mobility: Secondary | ICD-10-CM

## 2019-01-29 DIAGNOSIS — M25512 Pain in left shoulder: Secondary | ICD-10-CM

## 2019-01-29 DIAGNOSIS — R2681 Unsteadiness on feet: Secondary | ICD-10-CM

## 2019-01-29 DIAGNOSIS — M6281 Muscle weakness (generalized): Secondary | ICD-10-CM
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
## 2019-01-29 NOTE — Patient Instructions (Addendum)
Access Code: [email or web address]  URL: [email or web address]  Date: 01/29/2019  Prepared by: Mady Haagensen   Exercises  Sit to Stand with Counter Support - 5 reps - 2 sets - 2x daily - 7x weekly  Standing March with Counter Support - 10 reps - 2 sets - 2x daily - 7x weekly  Seated Long Arc Quad - 10 reps - 2 sets - 2x daily - 7x weekly  Seated Ankle Pumps - 10 reps - 3 sets - 1x daily - 7x weekly  Seated Hamstring Curl with Anchored Resistance - 10 reps - 32-3 sets - 1x daily - 7x weekly   REvised HEP today, to take out seated heelslides (replaced with seated hamstring curl with green theraband resistance) Added seated toe raises, seated heel raises

## 2019-02-03 ENCOUNTER — Ambulatory Visit: Payer: Medicare Other | Admitting: Occupational Therapy

## 2019-02-03 ENCOUNTER — Ambulatory Visit: Payer: Medicare Other | Admitting: Physical Therapy

## 2019-02-03 ENCOUNTER — Other Ambulatory Visit: Payer: Self-pay

## 2019-02-03 DIAGNOSIS — R2681 Unsteadiness on feet: Secondary | ICD-10-CM | POA: Diagnosis not present

## 2019-02-03 DIAGNOSIS — M6281 Muscle weakness (generalized): Secondary | ICD-10-CM

## 2019-02-03 DIAGNOSIS — R2689 Other abnormalities of gait and mobility: Secondary | ICD-10-CM | POA: Diagnosis not present

## 2019-02-03 DIAGNOSIS — M25512 Pain in left shoulder: Secondary | ICD-10-CM | POA: Diagnosis not present
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
## 2019-02-05 ENCOUNTER — Ambulatory Visit: Payer: Medicare Other | Admitting: Occupational Therapy

## 2019-02-05 ENCOUNTER — Ambulatory Visit: Payer: Medicare Other | Admitting: Physical Therapy

## 2019-02-05 ENCOUNTER — Other Ambulatory Visit: Payer: Self-pay | Admitting: Physical Medicine and Rehabilitation

## 2019-02-09 ENCOUNTER — Other Ambulatory Visit: Payer: Self-pay

## 2019-02-09 ENCOUNTER — Encounter: Payer: Self-pay | Admitting: Physical Therapy

## 2019-02-09 ENCOUNTER — Ambulatory Visit: Payer: Medicare Other | Admitting: Occupational Therapy

## 2019-02-09 ENCOUNTER — Ambulatory Visit: Payer: Medicare Other | Admitting: Physical Therapy

## 2019-02-09 DIAGNOSIS — Z23 Encounter for immunization: Secondary | ICD-10-CM | POA: Diagnosis not present

## 2019-02-09 DIAGNOSIS — R2689 Other abnormalities of gait and mobility: Secondary | ICD-10-CM | POA: Diagnosis not present

## 2019-02-09 DIAGNOSIS — R2681 Unsteadiness on feet: Secondary | ICD-10-CM | POA: Diagnosis not present

## 2019-02-09 DIAGNOSIS — M6281 Muscle weakness (generalized): Secondary | ICD-10-CM

## 2019-02-09 DIAGNOSIS — M25512 Pain in left shoulder: Secondary | ICD-10-CM | POA: Diagnosis not present
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
## 2019-02-11 ENCOUNTER — Encounter: Payer: Self-pay | Admitting: Physical Medicine and Rehabilitation

## 2019-02-11 ENCOUNTER — Encounter: Payer: Self-pay | Admitting: Physical Therapy

## 2019-02-11 ENCOUNTER — Ambulatory Visit: Payer: Medicare Other | Admitting: Physical Therapy

## 2019-02-11 ENCOUNTER — Other Ambulatory Visit: Payer: Self-pay

## 2019-02-11 ENCOUNTER — Encounter: Payer: Medicare Other | Attending: Registered Nurse | Admitting: Physical Medicine and Rehabilitation

## 2019-02-11 ENCOUNTER — Ambulatory Visit: Payer: Medicare Other | Admitting: Occupational Therapy

## 2019-02-11 DIAGNOSIS — R2689 Other abnormalities of gait and mobility: Secondary | ICD-10-CM | POA: Diagnosis not present

## 2019-02-11 DIAGNOSIS — G834 Cauda equina syndrome: Secondary | ICD-10-CM | POA: Diagnosis not present

## 2019-02-11 DIAGNOSIS — E785 Hyperlipidemia, unspecified: Secondary | ICD-10-CM | POA: Diagnosis not present

## 2019-02-11 DIAGNOSIS — I10 Essential (primary) hypertension: Secondary | ICD-10-CM | POA: Diagnosis not present

## 2019-02-11 DIAGNOSIS — M6281 Muscle weakness (generalized): Secondary | ICD-10-CM

## 2019-02-11 DIAGNOSIS — R2681 Unsteadiness on feet: Secondary | ICD-10-CM | POA: Diagnosis not present

## 2019-02-11 DIAGNOSIS — M5126 Other intervertebral disc displacement, lumbar region: Secondary | ICD-10-CM | POA: Diagnosis not present

## 2019-02-11 DIAGNOSIS — M25512 Pain in left shoulder: Secondary | ICD-10-CM | POA: Diagnosis not present

## 2019-02-11 DIAGNOSIS — M792 Neuralgia and neuritis, unspecified: Secondary | ICD-10-CM | POA: Diagnosis not present

## 2019-02-11 MED ORDER — PREGABALIN 75 MG PO CAPS: 75.0000 mg | ORAL_CAPSULE | Freq: Two times a day (BID) | ORAL | 5 refills | Status: DC
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
## 2019-02-11 NOTE — Patient Instructions (Signed)
  Pt is a 77 yr old female with Stage III CKD, HTN and HLD with partial cauda equina syndrome- with LLE> RLE weakness.  1. Will increase Lyrica for neuropathy- max 75 mg 2x/day- based on kidney function. Could do 3x/day but need more recent kidney function done.  2. No NSAIDs- only Tylenol  3. Strongly suggest using E stim by PT in LLE to see if can get /regain any more function- If any questions, please call Dr Dagoberto Ligas.  4.  F/U in 2 months

## 2019-02-15 ENCOUNTER — Ambulatory Visit: Payer: Medicare Other | Admitting: Physical Therapy

## 2019-02-15 ENCOUNTER — Encounter: Payer: Self-pay | Admitting: Physical Therapy

## 2019-02-15 ENCOUNTER — Other Ambulatory Visit: Payer: Self-pay

## 2019-02-15 DIAGNOSIS — M6281 Muscle weakness (generalized): Secondary | ICD-10-CM

## 2019-02-15 DIAGNOSIS — R2681 Unsteadiness on feet: Secondary | ICD-10-CM | POA: Diagnosis not present

## 2019-02-15 DIAGNOSIS — R2689 Other abnormalities of gait and mobility: Secondary | ICD-10-CM | POA: Diagnosis not present

## 2019-02-15 DIAGNOSIS — M25512 Pain in left shoulder: Secondary | ICD-10-CM | POA: Diagnosis not present
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
## 2019-02-17 ENCOUNTER — Ambulatory Visit: Payer: Medicare Other | Admitting: Physical Therapy

## 2019-02-17 ENCOUNTER — Encounter: Payer: Medicare Other | Admitting: Occupational Therapy

## 2019-02-17 ENCOUNTER — Other Ambulatory Visit: Payer: Self-pay

## 2019-02-17 DIAGNOSIS — R2681 Unsteadiness on feet: Secondary | ICD-10-CM

## 2019-02-17 DIAGNOSIS — M6281 Muscle weakness (generalized): Secondary | ICD-10-CM

## 2019-02-17 DIAGNOSIS — M25512 Pain in left shoulder: Secondary | ICD-10-CM

## 2019-02-17 DIAGNOSIS — R2689 Other abnormalities of gait and mobility: Secondary | ICD-10-CM
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
## 2019-02-19 DIAGNOSIS — Z9889 Other specified postprocedural states: Secondary | ICD-10-CM | POA: Diagnosis not present

## 2019-02-19 DIAGNOSIS — M5416 Radiculopathy, lumbar region: Secondary | ICD-10-CM | POA: Diagnosis not present

## 2019-02-23 ENCOUNTER — Other Ambulatory Visit: Payer: Self-pay

## 2019-02-23 ENCOUNTER — Ambulatory Visit: Payer: Medicare Other | Admitting: Physical Therapy

## 2019-02-23 ENCOUNTER — Encounter: Payer: Self-pay | Admitting: Physical Therapy

## 2019-02-23 ENCOUNTER — Ambulatory Visit: Payer: Medicare Other | Admitting: Occupational Therapy

## 2019-02-23 DIAGNOSIS — M6281 Muscle weakness (generalized): Secondary | ICD-10-CM

## 2019-02-23 DIAGNOSIS — R2681 Unsteadiness on feet: Secondary | ICD-10-CM

## 2019-02-23 DIAGNOSIS — R2689 Other abnormalities of gait and mobility: Secondary | ICD-10-CM | POA: Diagnosis not present

## 2019-02-23 DIAGNOSIS — M25512 Pain in left shoulder: Secondary | ICD-10-CM | POA: Diagnosis not present
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
## 2019-02-26 ENCOUNTER — Encounter: Payer: Medicare Other | Admitting: Occupational Therapy

## 2019-02-26 ENCOUNTER — Encounter: Payer: Self-pay | Admitting: Physical Therapy

## 2019-02-26 ENCOUNTER — Other Ambulatory Visit: Payer: Self-pay

## 2019-02-26 ENCOUNTER — Ambulatory Visit: Payer: Medicare Other | Attending: Physical Medicine and Rehabilitation | Admitting: Physical Therapy

## 2019-02-26 DIAGNOSIS — R2681 Unsteadiness on feet: Secondary | ICD-10-CM

## 2019-02-26 DIAGNOSIS — M6281 Muscle weakness (generalized): Secondary | ICD-10-CM

## 2019-02-26 DIAGNOSIS — R2689 Other abnormalities of gait and mobility: Secondary | ICD-10-CM | POA: Diagnosis not present
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
## 2019-03-02 ENCOUNTER — Ambulatory Visit: Payer: Medicare Other | Admitting: Physical Therapy

## 2019-03-02 ENCOUNTER — Other Ambulatory Visit: Payer: Self-pay

## 2019-03-02 ENCOUNTER — Ambulatory Visit: Payer: Medicare Other | Admitting: Occupational Therapy

## 2019-03-02 DIAGNOSIS — M6281 Muscle weakness (generalized): Secondary | ICD-10-CM | POA: Diagnosis not present

## 2019-03-02 DIAGNOSIS — R2681 Unsteadiness on feet: Secondary | ICD-10-CM

## 2019-03-02 DIAGNOSIS — R2689 Other abnormalities of gait and mobility: Secondary | ICD-10-CM | POA: Diagnosis not present

## 2019-03-02 NOTE — Patient Instructions (Addendum)
Access Code: [email or web address]  URL: [email or web address]  Date: 03/02/2019  Prepared by: Mady Haagensen   Exercises Sit to Stand with Counter Support - 5 reps - 2 sets - 2x daily - 7x weekly Standing March with Counter Support - 10 reps - 2 sets - 2x daily - 7x weekly Seated Long Arc Quad - 10 reps - 2 sets - 2x daily - 7x weekly Seated Ankle Pumps - 10 reps - 3 sets - 1x daily - 7x weekly Seated Hamstring Curl with Anchored Resistance - 10 reps - 32-3 sets - 1x daily - 7x weekly  Added balance exercises 03/02/2019 Wide Stance with Head Nods and Counter Support - 5 reps - 2 sets - 1x daily - 5x weekly Narrow Stance with Head Nods and Counter Support - 5 reps - 2 sets - 1x daily - 5x weekly
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
## 2019-03-05 ENCOUNTER — Encounter: Payer: Medicare Other | Admitting: Occupational Therapy

## 2019-03-05 ENCOUNTER — Other Ambulatory Visit: Payer: Self-pay

## 2019-03-05 ENCOUNTER — Encounter: Payer: Self-pay | Admitting: Physical Therapy

## 2019-03-05 ENCOUNTER — Ambulatory Visit: Payer: Medicare Other | Admitting: Physical Therapy

## 2019-03-05 DIAGNOSIS — M6281 Muscle weakness (generalized): Secondary | ICD-10-CM | POA: Diagnosis not present

## 2019-03-05 DIAGNOSIS — R2681 Unsteadiness on feet: Secondary | ICD-10-CM | POA: Diagnosis not present

## 2019-03-05 DIAGNOSIS — R2689 Other abnormalities of gait and mobility: Secondary | ICD-10-CM | POA: Diagnosis not present
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
## 2019-03-09 ENCOUNTER — Ambulatory Visit: Payer: Medicare Other | Admitting: Physical Therapy

## 2019-03-10 ENCOUNTER — Other Ambulatory Visit: Payer: Self-pay

## 2019-03-10 ENCOUNTER — Encounter: Payer: Self-pay | Admitting: Physical Therapy

## 2019-03-10 ENCOUNTER — Ambulatory Visit: Payer: Medicare Other | Admitting: Physical Therapy

## 2019-03-10 DIAGNOSIS — M6281 Muscle weakness (generalized): Secondary | ICD-10-CM

## 2019-03-10 DIAGNOSIS — R2681 Unsteadiness on feet: Secondary | ICD-10-CM

## 2019-03-10 DIAGNOSIS — R2689 Other abnormalities of gait and mobility: Secondary | ICD-10-CM | POA: Diagnosis not present

## 2019-03-12 ENCOUNTER — Ambulatory Visit: Payer: Medicare Other | Admitting: Physical Therapy

## 2019-03-12 ENCOUNTER — Encounter: Payer: Self-pay | Admitting: Physical Therapy

## 2019-03-12 ENCOUNTER — Ambulatory Visit: Payer: Medicare Other | Admitting: Occupational Therapy

## 2019-03-12 ENCOUNTER — Other Ambulatory Visit: Payer: Self-pay

## 2019-03-12 DIAGNOSIS — R2689 Other abnormalities of gait and mobility: Secondary | ICD-10-CM

## 2019-03-12 DIAGNOSIS — M6281 Muscle weakness (generalized): Secondary | ICD-10-CM

## 2019-03-12 DIAGNOSIS — Z1211 Encounter for screening for malignant neoplasm of colon: Secondary | ICD-10-CM | POA: Diagnosis not present

## 2019-03-12 DIAGNOSIS — R2681 Unsteadiness on feet: Secondary | ICD-10-CM
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
## 2019-03-17 ENCOUNTER — Ambulatory Visit: Payer: Medicare Other | Admitting: Physical Therapy

## 2019-03-19 ENCOUNTER — Ambulatory Visit: Payer: Medicare Other | Admitting: Physical Therapy

## 2019-03-24 ENCOUNTER — Ambulatory Visit: Payer: Self-pay | Admitting: Physical Therapy

## 2019-03-26 ENCOUNTER — Ambulatory Visit: Payer: Medicare Other | Admitting: Physical Therapy

## 2019-03-30 ENCOUNTER — Other Ambulatory Visit: Payer: Self-pay

## 2019-03-30 ENCOUNTER — Ambulatory Visit: Payer: Medicare Other | Attending: Physical Medicine and Rehabilitation | Admitting: Physical Therapy

## 2019-03-30 ENCOUNTER — Encounter: Payer: Self-pay | Admitting: Physical Therapy

## 2019-03-30 DIAGNOSIS — R2681 Unsteadiness on feet: Secondary | ICD-10-CM | POA: Diagnosis not present

## 2019-03-30 DIAGNOSIS — M6281 Muscle weakness (generalized): Secondary | ICD-10-CM | POA: Diagnosis not present

## 2019-03-30 DIAGNOSIS — R2689 Other abnormalities of gait and mobility: Secondary | ICD-10-CM
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
## 2019-04-01 ENCOUNTER — Ambulatory Visit: Payer: Medicare Other | Admitting: Physical Therapy

## 2019-04-01 ENCOUNTER — Other Ambulatory Visit: Payer: Self-pay

## 2019-04-01 ENCOUNTER — Encounter: Payer: Self-pay | Admitting: Physical Therapy

## 2019-04-01 ENCOUNTER — Telehealth: Payer: Self-pay

## 2019-04-01 DIAGNOSIS — R2689 Other abnormalities of gait and mobility: Secondary | ICD-10-CM

## 2019-04-01 DIAGNOSIS — R2681 Unsteadiness on feet: Secondary | ICD-10-CM | POA: Diagnosis not present

## 2019-04-01 DIAGNOSIS — M6281 Muscle weakness (generalized): Secondary | ICD-10-CM

## 2019-04-01 NOTE — Patient Instructions (Signed)
Access Code: [email or web address]  URL: [email or web address]  Date: 04/01/2019  Prepared by: Willow Ora   Exercises Seated Long Arc Quad - 10 reps - 1 sets - 3 hold - 1x daily - 5x weekly Seated Hamstring Curl with Anchored Resistance - 10 reps - 1 sets - 1x daily - 5x weekly Sit to Stand - 10 reps - 1 sets - 1x daily - 5x weekly Walking March - 4 reps - 1 sets - 1x daily - 5x weekly Side Stepping with Counter Support - 4 reps - 1 sets - 1x daily - 5x weekly Standing Near Stance in Corner with Eyes Closed - 3 reps - 1 sets - 20 hold - 1x daily - 5x weekly Standing in corner with eyes open - 10 reps - 1 sets - 1x daily - 5x weekly

## 2019-04-01 NOTE — Telephone Encounter (Signed)
Patient has requested 90 day supplies of medication for better price and adherence.

## 2019-04-02 MED ORDER — PREGABALIN 75 MG PO CAPS: 75.0000 mg | ORAL_CAPSULE | Freq: Two times a day (BID) | ORAL | 1 refills | Status: AC
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
## 2019-04-06 ENCOUNTER — Ambulatory Visit: Payer: Medicare Other | Admitting: Physical Therapy

## 2019-04-08 ENCOUNTER — Ambulatory Visit: Payer: Medicare Other | Admitting: Physical Therapy

## 2019-04-09 ENCOUNTER — Encounter: Payer: Medicare Other | Admitting: Physical Medicine and Rehabilitation

## 2019-04-13 DIAGNOSIS — L810 Postinflammatory hyperpigmentation: Secondary | ICD-10-CM | POA: Diagnosis not present

## 2019-04-13 DIAGNOSIS — L308 Other specified dermatitis: Secondary | ICD-10-CM | POA: Diagnosis not present

## 2019-04-19 ENCOUNTER — Other Ambulatory Visit: Payer: Self-pay

## 2019-04-19 ENCOUNTER — Encounter: Payer: Self-pay | Admitting: Physical Medicine and Rehabilitation

## 2019-04-19 ENCOUNTER — Encounter: Payer: Medicare Other | Attending: Registered Nurse | Admitting: Physical Medicine and Rehabilitation

## 2019-04-19 VITALS — BP 173/63 | HR 55 | Temp 97.7°F | Ht <= 58 in | Wt 111.0 lb

## 2019-04-19 DIAGNOSIS — M792 Neuralgia and neuritis, unspecified: Secondary | ICD-10-CM | POA: Insufficient documentation

## 2019-04-19 DIAGNOSIS — E785 Hyperlipidemia, unspecified: Secondary | ICD-10-CM | POA: Diagnosis not present

## 2019-04-19 DIAGNOSIS — N179 Acute kidney failure, unspecified: Secondary | ICD-10-CM | POA: Diagnosis not present

## 2019-04-19 DIAGNOSIS — N183 Chronic kidney disease, stage 3 unspecified: Secondary | ICD-10-CM | POA: Diagnosis not present

## 2019-04-19 DIAGNOSIS — G834 Cauda equina syndrome: Secondary | ICD-10-CM | POA: Insufficient documentation

## 2019-04-19 DIAGNOSIS — M5126 Other intervertebral disc displacement, lumbar region: Secondary | ICD-10-CM | POA: Diagnosis not present

## 2019-04-19 DIAGNOSIS — I10 Essential (primary) hypertension: Secondary | ICD-10-CM | POA: Insufficient documentation

## 2019-04-19 DIAGNOSIS — R29818 Other symptoms and signs involving the nervous system: Secondary | ICD-10-CM
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
## 2019-04-19 NOTE — Patient Instructions (Signed)
Patient is a 78 yr old female with partial cauda equina syndrome - without bowel and bladder issues and nerve pain; as well as CKD Stage III- and impaired sensation/proprioception on LLE   1. Can increase Lyrica IF her kidney function is doing  IIIa level (1.25) , but would con't Lyrica 75 mg BID if Cr up around 1.40. Suggest PCP let me know if labs done; so I can make the clal on the Lyrica dose.  2. IF her physical function continues to improves, maybe would be appropriate to get back into PT after 07/2019- if has made some gains.  3. Doesn't like RW- wants a cane. Explained cane is bad for her due to lack of sensation/know where her LLE is in space  4. Has refilled Lyrica as of 04/02/2019- last time refilled-   5. F/U in March 2021

## 2019-04-20 DIAGNOSIS — M545 Low back pain: Secondary | ICD-10-CM | POA: Diagnosis not present

## 2019-06-10 DIAGNOSIS — L578 Other skin changes due to chronic exposure to nonionizing radiation: Secondary | ICD-10-CM | POA: Diagnosis not present

## 2019-06-10 DIAGNOSIS — Z23 Encounter for immunization: Secondary | ICD-10-CM | POA: Diagnosis not present

## 2019-06-10 DIAGNOSIS — L821 Other seborrheic keratosis: Secondary | ICD-10-CM | POA: Diagnosis not present

## 2019-06-10 DIAGNOSIS — I872 Venous insufficiency (chronic) (peripheral): Secondary | ICD-10-CM | POA: Diagnosis not present

## 2019-06-10 DIAGNOSIS — L814 Other melanin hyperpigmentation: Secondary | ICD-10-CM | POA: Diagnosis not present

## 2019-07-23 DIAGNOSIS — Z23 Encounter for immunization: Secondary | ICD-10-CM | POA: Diagnosis not present

## 2019-08-16 ENCOUNTER — Encounter
Payer: Medicare Other | Attending: Physical Medicine and Rehabilitation | Admitting: Physical Medicine and Rehabilitation

## 2019-08-16 ENCOUNTER — Other Ambulatory Visit: Payer: Self-pay

## 2019-08-16 ENCOUNTER — Encounter: Payer: Self-pay | Admitting: Physical Medicine and Rehabilitation

## 2019-08-16 VITALS — BP 165/62 | HR 59 | Temp 97.5°F | Ht <= 58 in | Wt 114.0 lb

## 2019-08-16 DIAGNOSIS — R29818 Other symptoms and signs involving the nervous system: Secondary | ICD-10-CM | POA: Insufficient documentation

## 2019-08-16 DIAGNOSIS — G834 Cauda equina syndrome: Secondary | ICD-10-CM | POA: Diagnosis not present

## 2019-08-16 DIAGNOSIS — N3941 Urge incontinence: Secondary | ICD-10-CM | POA: Insufficient documentation

## 2019-08-16 DIAGNOSIS — N319 Neuromuscular dysfunction of bladder, unspecified: Secondary | ICD-10-CM | POA: Insufficient documentation

## 2019-08-16 MED ORDER — PREGABALIN 75 MG PO CAPS: 75.0000 mg | ORAL_CAPSULE | Freq: Two times a day (BID) | ORAL | 3 refills | Status: AC

## 2019-08-16 NOTE — Patient Instructions (Signed)
  1. When needs to void; go immediately to bathroom. Also suggest timed voiding- go every 2 hours whether needs to or not- can also try that technique.  2. Wait on Bladder meds- if needs them in future, don't wait til next appointment; call me and will start Tropsium would be a good choice.- Less side effects.   3.  Refill Lyrica for 90 days 75 mg BID with 3 RFs- [facility].   4. It's important to do home exercise program daily, but don't overdo WALKING, since can make you have knee give out.   5. Discussed new L AFO- won't be any easier getting on a carbon fber AFO than current hinged AFO. Used to wear size 5 shoe and bought 6.5 to get AFO in.   6. Most people need some assistance putting on AFO/ankle foot orthotic. DON"T WALK WITH CANE! PUTS YOU AT RISK FOR FALLS!!  7. Be careful walking, esp in afternoon, without AFO, increased risk of falls.   8. Leaving to move to [place] to [place]. Likely moving.  Been here 10 yrs.    9. F/U as needed
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
## 2019-08-18 ENCOUNTER — Encounter: Payer: Medicare Other | Admitting: Physical Medicine and Rehabilitation

## 2019-08-18 DIAGNOSIS — N1832 Chronic kidney disease, stage 3b: Secondary | ICD-10-CM | POA: Diagnosis not present

## 2019-08-18 DIAGNOSIS — I10 Essential (primary) hypertension: Secondary | ICD-10-CM | POA: Diagnosis not present

## 2019-08-18 DIAGNOSIS — R011 Cardiac murmur, unspecified: Secondary | ICD-10-CM | POA: Diagnosis not present

## 2019-08-18 DIAGNOSIS — E7800 Pure hypercholesterolemia, unspecified: Secondary | ICD-10-CM | POA: Diagnosis not present

## 2019-08-18 DIAGNOSIS — J849 Interstitial pulmonary disease, unspecified: Secondary | ICD-10-CM | POA: Diagnosis not present

## 2019-08-18 DIAGNOSIS — H9193 Unspecified hearing loss, bilateral: Secondary | ICD-10-CM | POA: Diagnosis not present

## 2019-08-18 DIAGNOSIS — D631 Anemia in chronic kidney disease: Secondary | ICD-10-CM | POA: Diagnosis not present

## 2019-08-20 DIAGNOSIS — Z23 Encounter for immunization: Secondary | ICD-10-CM | POA: Diagnosis not present

## 2019-08-26 ENCOUNTER — Other Ambulatory Visit (HOSPITAL_COMMUNITY): Payer: Self-pay | Admitting: Family Medicine

## 2019-08-26 DIAGNOSIS — R011 Cardiac murmur, unspecified: Secondary | ICD-10-CM

## 2019-09-03 ENCOUNTER — Encounter (HOSPITAL_COMMUNITY): Payer: Self-pay | Admitting: Family Medicine

## 2019-09-14 ENCOUNTER — Telehealth (HOSPITAL_COMMUNITY): Payer: Self-pay | Admitting: Family Medicine

## 2019-09-14 NOTE — Telephone Encounter (Signed)
Just an FYI. We have made several attempts to contact this patient including sending a letter to schedule or reschedule their echocardiogram. We will be removing the patient from the echo WQ.    Mailed Letter  09/03/19 [facility] to schedule @ 10:20/LBW  08/30/19 [facility] to schedule @ 10:24/LBW  08/26/19 [facility] to schedule @ 3:24/LBW       Thank you

## 2020-02-17 DIAGNOSIS — Z23 Encounter for immunization: Secondary | ICD-10-CM | POA: Diagnosis not present

## 2020-02-17 DIAGNOSIS — N184 Chronic kidney disease, stage 4 (severe): Secondary | ICD-10-CM | POA: Diagnosis not present

## 2020-02-17 DIAGNOSIS — R809 Proteinuria, unspecified: Secondary | ICD-10-CM | POA: Diagnosis not present

## 2020-02-17 DIAGNOSIS — I129 Hypertensive chronic kidney disease with stage 1 through stage 4 chronic kidney disease, or unspecified chronic kidney disease: Secondary | ICD-10-CM | POA: Diagnosis not present

## 2020-02-17 DIAGNOSIS — D649 Anemia, unspecified: Secondary | ICD-10-CM | POA: Diagnosis not present

## 2020-02-18 DIAGNOSIS — H04123 Dry eye syndrome of bilateral lacrimal glands: Secondary | ICD-10-CM | POA: Diagnosis not present

## 2020-02-18 DIAGNOSIS — Z961 Presence of intraocular lens: Secondary | ICD-10-CM | POA: Diagnosis not present

## 2020-02-28 DIAGNOSIS — G4709 Other insomnia: Secondary | ICD-10-CM | POA: Diagnosis not present

## 2020-02-28 DIAGNOSIS — N183 Chronic kidney disease, stage 3 unspecified: Secondary | ICD-10-CM | POA: Diagnosis not present

## 2020-02-28 DIAGNOSIS — I10 Essential (primary) hypertension: Secondary | ICD-10-CM | POA: Diagnosis not present

## 2020-02-28 DIAGNOSIS — E782 Mixed hyperlipidemia: Secondary | ICD-10-CM | POA: Diagnosis not present

## 2020-02-28 DIAGNOSIS — M5136 Other intervertebral disc degeneration, lumbar region: Secondary | ICD-10-CM | POA: Diagnosis not present

## 2020-02-29 DIAGNOSIS — M5136 Other intervertebral disc degeneration, lumbar region: Secondary | ICD-10-CM | POA: Diagnosis not present

## 2020-03-07 DIAGNOSIS — M5136 Other intervertebral disc degeneration, lumbar region: Secondary | ICD-10-CM | POA: Diagnosis not present

## 2020-03-08 DIAGNOSIS — Z23 Encounter for immunization: Secondary | ICD-10-CM | POA: Diagnosis not present

## 2020-03-09 DIAGNOSIS — M5136 Other intervertebral disc degeneration, lumbar region: Secondary | ICD-10-CM | POA: Diagnosis not present

## 2020-03-14 DIAGNOSIS — M5136 Other intervertebral disc degeneration, lumbar region: Secondary | ICD-10-CM | POA: Diagnosis not present

## 2020-03-15 LAB — PULMONARY FUNCTION TEST
DL/VA % pred: 144 %
DL/VA: 5.05 ml/min/mmHg/L
DLCO unc % pred: 101 %
DLCO unc: 13.72 ml/min/mmHg
FEF 25-75 Post: 2.7 L/sec
FEF 25-75 Pre: 2.47 L/sec
FEF2575-%Change-Post: 9 %
FEF2575-%Pred-Post: 208 %
FEF2575-%Pred-Pre: 191 %
FEV1-%Change-Post: 3 %
FEV1-%Pred-Post: 111 %
FEV1-%Pred-Pre: 108 %
FEV1-Post: 1.61 L
FEV1-Pre: 1.56 L
FEV1FVC-%Change-Post: -1 %
FEV1FVC-%Pred-Pre: 120 %
FEV6-%Change-Post: 4 %
FEV6-%Pred-Post: 97 %
FEV6-%Pred-Pre: 93 %
FEV6-Post: 1.8 L
FEV6-Pre: 1.72 L
FEV6FVC-%Pred-Post: 106 %
FEV6FVC-%Pred-Pre: 106 %
FVC-%Change-Post: 4 %
FVC-%Pred-Post: 92 %
FVC-%Pred-Pre: 88 %
FVC-Post: 1.8 L
Post FEV1/FVC ratio: 89 %
Post FEV6/FVC ratio: 100 %
Pre FEV1/FVC ratio: 91 %
Pre FEV6/FVC Ratio: 100 %
RV % pred: 74 %
RV: 1.38 L
TLC % pred: 79 %
TLC: 3.07 L

## 2020-03-16 DIAGNOSIS — M5136 Other intervertebral disc degeneration, lumbar region: Secondary | ICD-10-CM | POA: Diagnosis not present

## 2020-03-21 DIAGNOSIS — M5136 Other intervertebral disc degeneration, lumbar region: Secondary | ICD-10-CM | POA: Diagnosis not present

## 2020-03-23 DIAGNOSIS — M5136 Other intervertebral disc degeneration, lumbar region: Secondary | ICD-10-CM | POA: Diagnosis not present

## 2020-03-28 DIAGNOSIS — M5136 Other intervertebral disc degeneration, lumbar region: Secondary | ICD-10-CM | POA: Diagnosis not present

## 2020-03-30 DIAGNOSIS — M5136 Other intervertebral disc degeneration, lumbar region: Secondary | ICD-10-CM | POA: Diagnosis not present

## 2020-04-03 DIAGNOSIS — M5136 Other intervertebral disc degeneration, lumbar region: Secondary | ICD-10-CM | POA: Diagnosis not present

## 2020-04-06 DIAGNOSIS — M5136 Other intervertebral disc degeneration, lumbar region: Secondary | ICD-10-CM | POA: Diagnosis not present

## 2020-05-08 DIAGNOSIS — E782 Mixed hyperlipidemia: Secondary | ICD-10-CM | POA: Diagnosis not present

## 2020-05-08 DIAGNOSIS — R809 Proteinuria, unspecified: Secondary | ICD-10-CM | POA: Diagnosis not present

## 2020-05-08 DIAGNOSIS — I10 Essential (primary) hypertension: Secondary | ICD-10-CM | POA: Diagnosis not present

## 2020-05-08 DIAGNOSIS — N183 Chronic kidney disease, stage 3 unspecified: Secondary | ICD-10-CM | POA: Diagnosis not present

## 2020-05-08 DIAGNOSIS — D539 Nutritional anemia, unspecified: Secondary | ICD-10-CM | POA: Diagnosis not present

## 2020-05-08 DIAGNOSIS — E559 Vitamin D deficiency, unspecified: Secondary | ICD-10-CM | POA: Diagnosis not present

## 2020-05-08 DIAGNOSIS — N1832 Chronic kidney disease, stage 3b: Secondary | ICD-10-CM | POA: Diagnosis not present

## 2020-07-19 ENCOUNTER — Other Ambulatory Visit: Payer: Self-pay | Admitting: Physical Medicine and Rehabilitation

## 2020-08-27 IMAGING — RF DG C-ARM 61-120 MIN
1 series · 2 of 2 positions shown · non-contrast
Comparison: Preoperative MRI earlier this day.

CLINICAL DATA: Left Lumbar 3-4 Transforaminal Lumbar Interbody
Fusion (TLIF) w/ Instrumentation and Allograft.

EXAM:
LUMBAR SPINE - 2-3 VIEW; DG C-ARM 61-120 MIN

[Series 1: run · 2 of 2 slices shown]
[im 1/2]
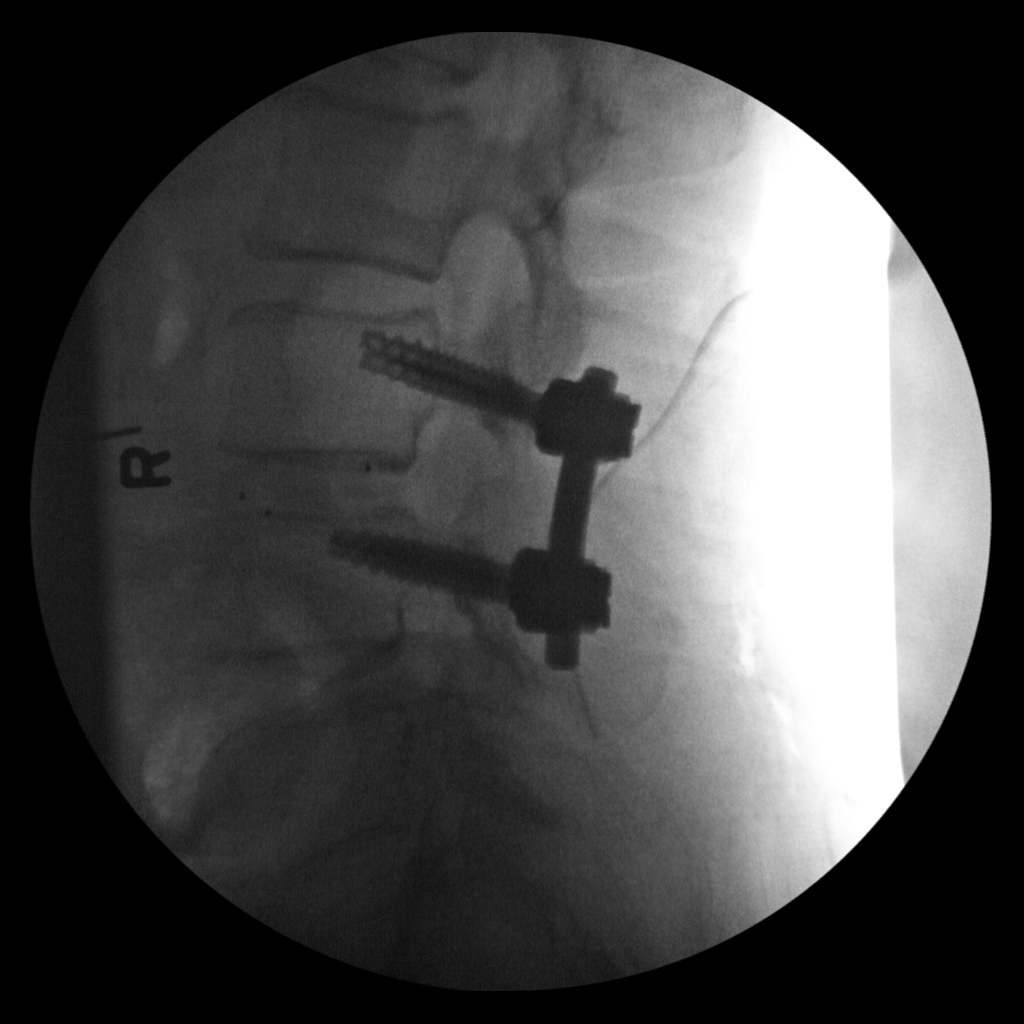
[im 2/2]
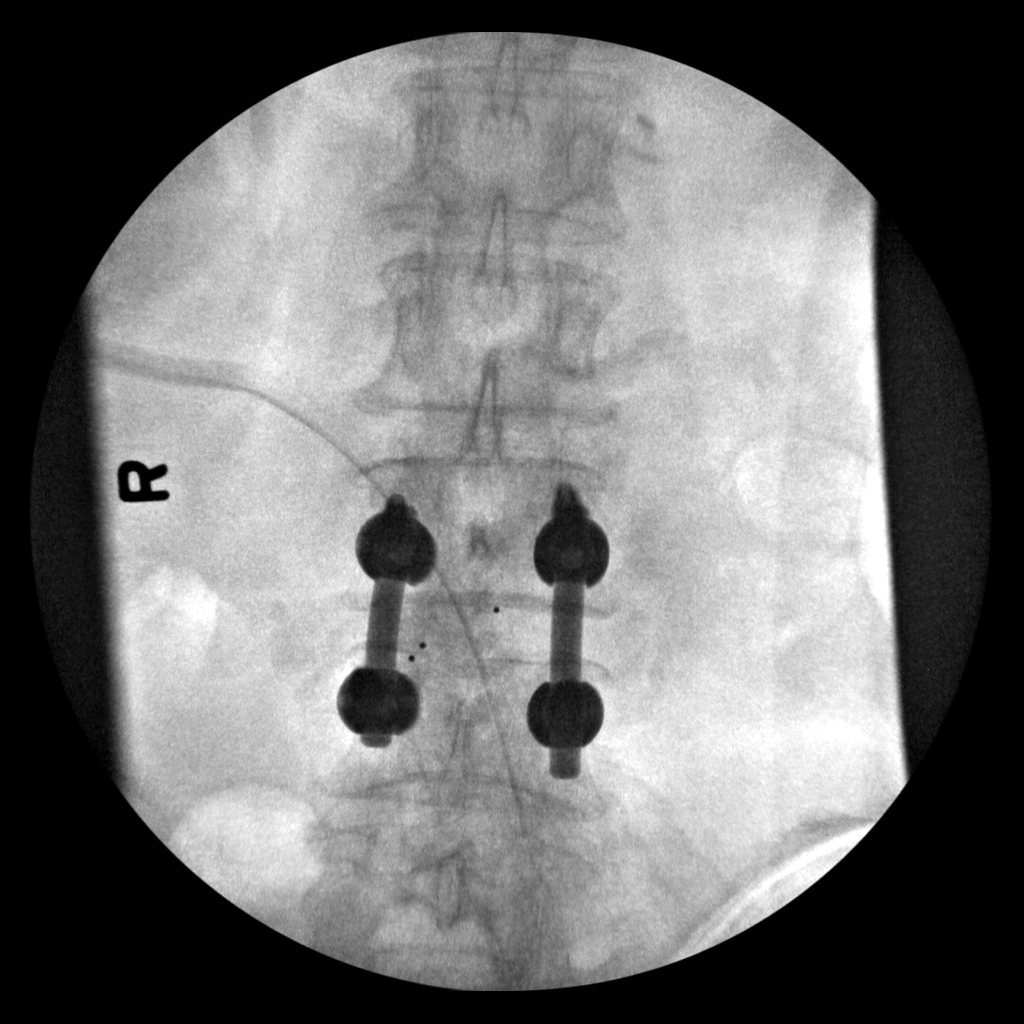

[2 of 2 positions shown; findings below may reference images not displayed]

FINDINGS: Two fluoroscopic spot images obtained in the operating room in
frontal and lateral projections demonstrate posterior rod and
transpedicular screw fusion at L3-L4 with interbody spacer.
Fluoroscopy time 1 minutes 52 seconds.
IMPRESSION: Procedural fluoroscopy L3-L4 fusion.

## 2020-08-27 IMAGING — MR MRI LUMBAR SPINE WITHOUT CONTRAST
4 of 5 series · 19 of 48 positions shown · non-contrast
Comparison: MRI dated 10/26/2018

CLINICAL DATA: Severe low back pain and right leg pain. Numbness
and tingling in the right leg.

EXAM:
MRI LUMBAR SPINE WITHOUT CONTRAST
TECHNIQUE: Multiplanar, multisequence MR imaging of the lumbar spine was
performed. No intravenous contrast was administered.

[Series 3: T2 · sagittal · 4.0mm · 0.55mm/px · 5 of 13 slices shown (1 of 2)]
[im 1/13]
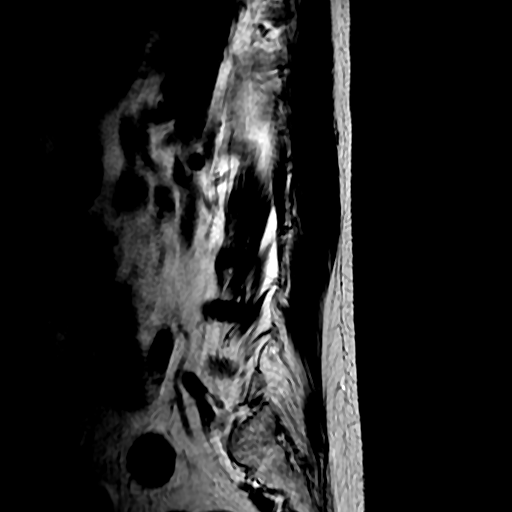
[im 4/13]
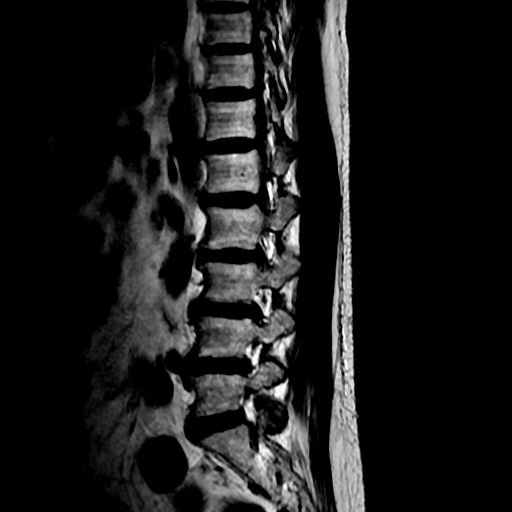
[im 7/13]
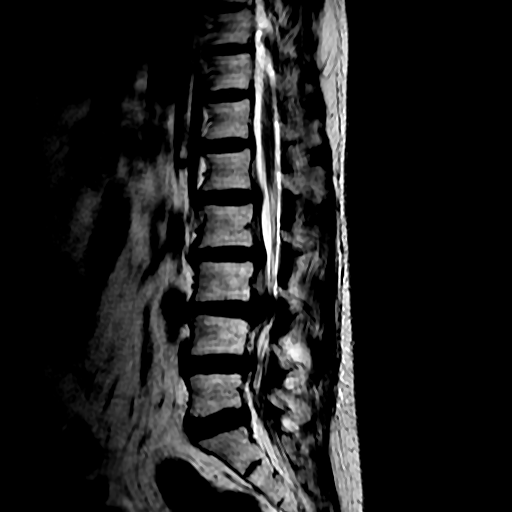
[im 10/13]
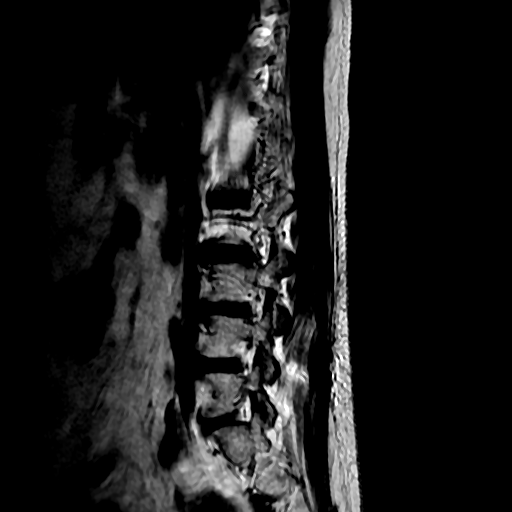
[im 13/13]
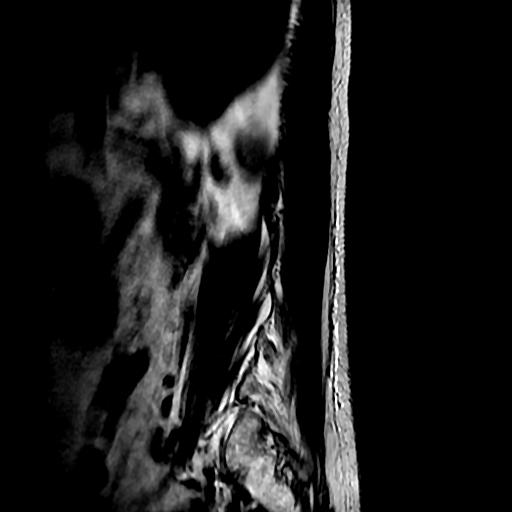

[Series 5: T1 · sagittal · 4.0mm · 0.55mm/px · 3 of 13 slices shown (1 of 2)]
[im 3/13]
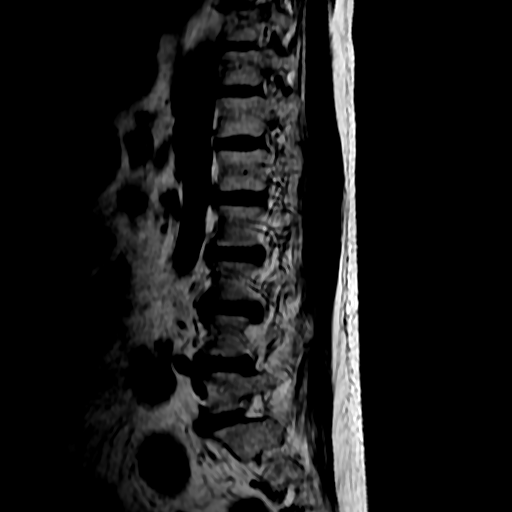
[im 8/13]
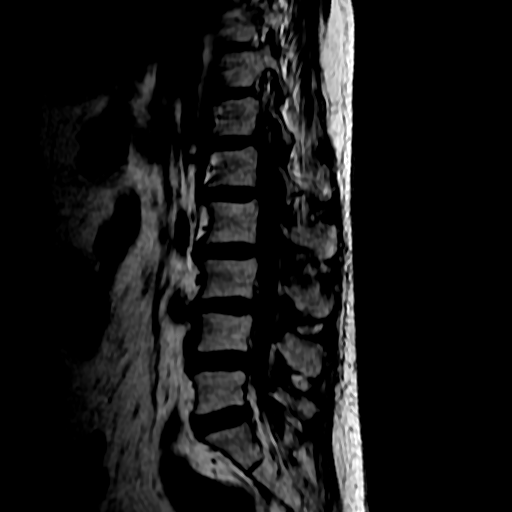
[im 13/13]
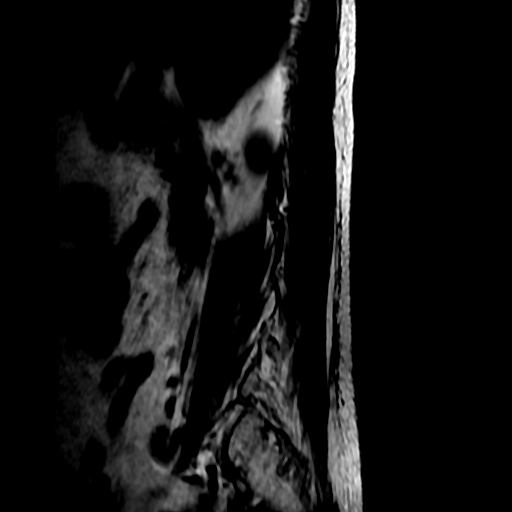

[Series 7: T1 · axial · 4.0mm · 0.35mm/px · z∈[-96,+34]mm · 3 of 36 slices shown (2 of 2)]
[im 5/36]
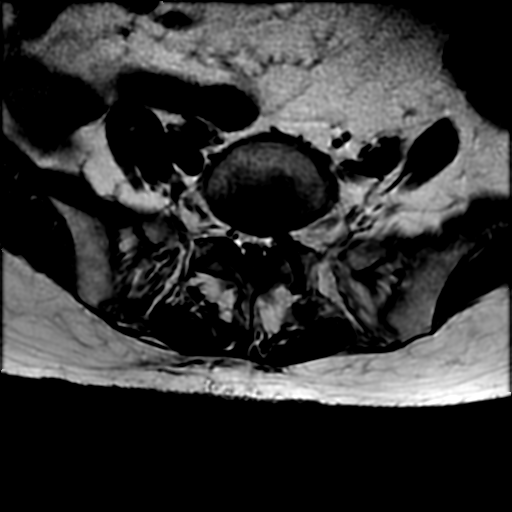
[im 19/36]
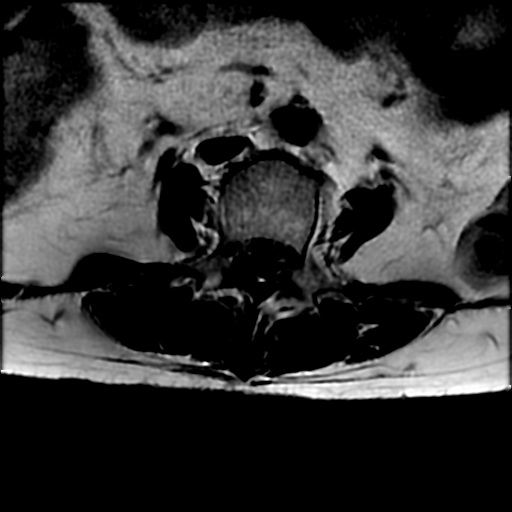
[im 31/36]
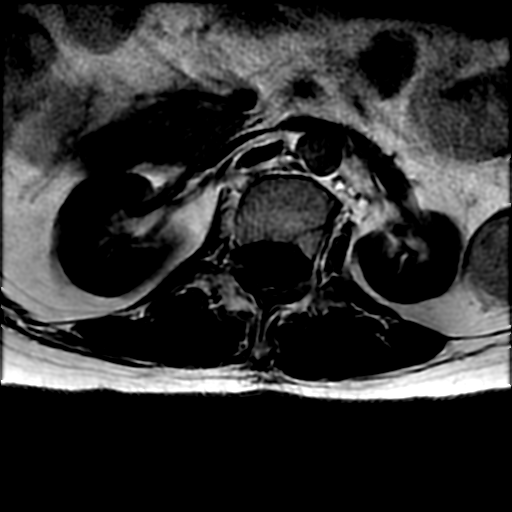

[Series 9: T2 · axial · 4.0mm · 0.35mm/px · z∈[-106,+34]mm · 8 of 36 slices shown (2 of 2)]
[im 3/36]
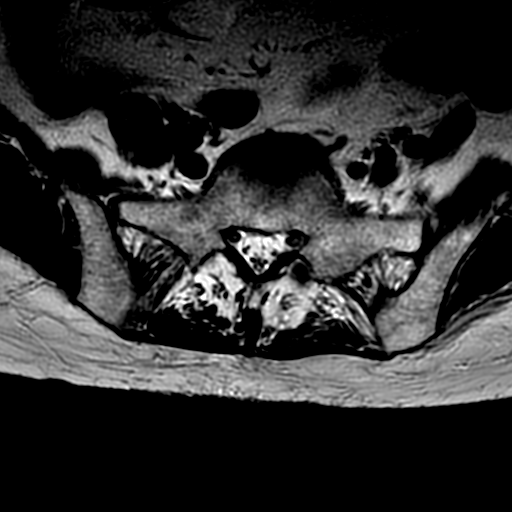
[im 5/36]
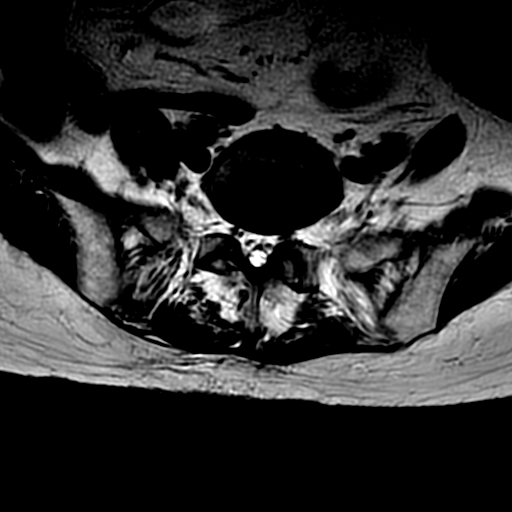
[im 8/36]
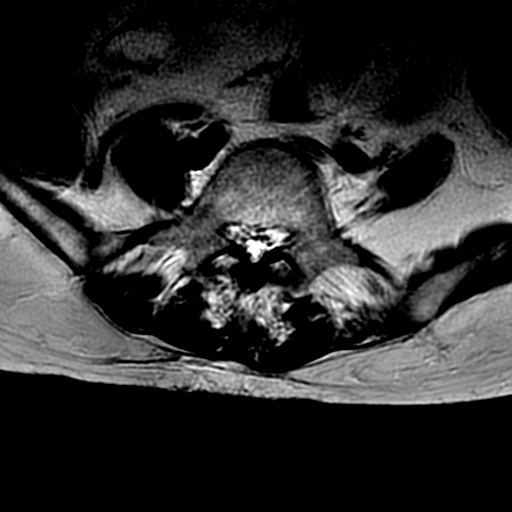
[im 12/36]
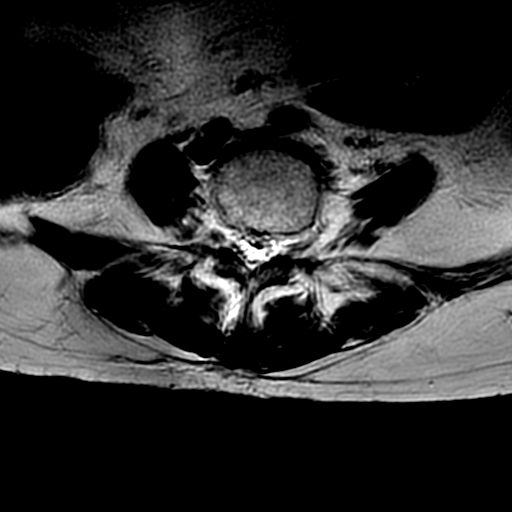
[im 17/36]
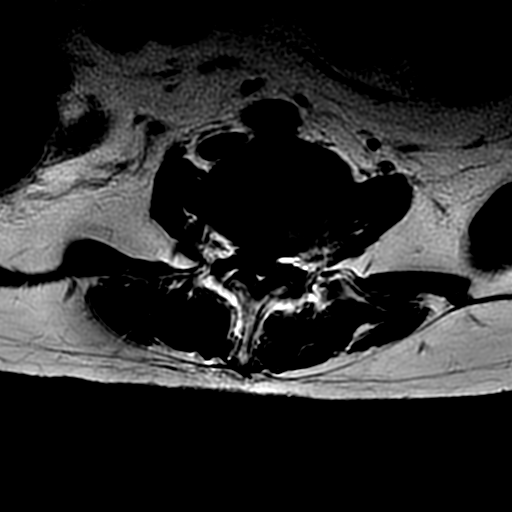
[im 19/36]
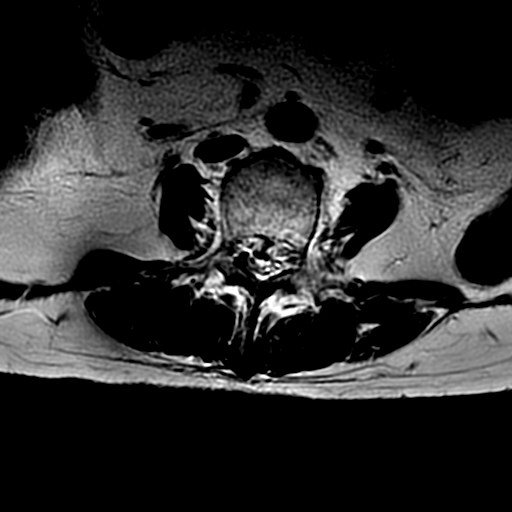
[im 22/36]
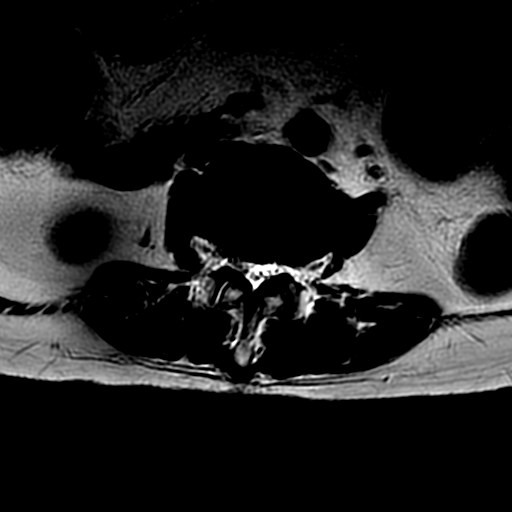
[im 31/36]
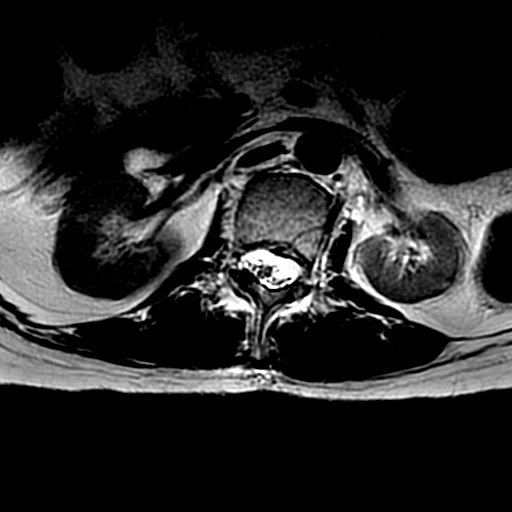

[19 of 48 positions shown; findings below may reference images not displayed]

FINDINGS: Segmentation:  Standard.

Alignment:  Straightening of the normal lumbar lordosis.

Vertebrae:  No fracture, evidence of discitis, or bone lesion.

Conus medullaris and cauda equina: Conus extends to the L1-2 level.
Conus appears normal.

Paraspinal and other soft tissues: Cholelithiasis. Otherwise
negative.

Disc levels:

T12-L1: Negative.

L1-2: Small central subligamentous disc protrusion without neural
impingement, unchanged.

L2-3: Broad-based disc bulge with a central subligamentous disc
protrusion slightly asymmetric to the right with compression of the
thecal sac, unchanged and without focal neural impingement.

L3-4: Large central disc extrusion extending superiorly and
inferiorly obliterating the thecal sac appearing even more extensive
than on the prior exam. This traps all of the nerves at this level.
No foraminal stenosis.

L4-5: Broad-based disc bulge with central disc protrusion slightly
asymmetric to the left compressing the thecal sac and the left
lateral recess, unchanged since the prior study. Slight hypertrophy
of the ligamentum flavum and facet joints.

L5-S1: Tiny broad-based disc bulge with no neural impingement.
IMPRESSION: 1. Large soft disc extrusion at L3-4 severely compressing the thecal
sac even more so than on the prior study. This should affect the L4
and more distal nerve roots bilaterally.
2. Stable soft disc protrusions at L2-3 and L4-5 as described.

## 2023-04-27 DEATH — deceased
